# Patient Record
Sex: Male | Born: 1945 | Race: White | Hispanic: No | Marital: Married | Smoking: Former smoker
Health system: Southern US, Community
[De-identification: ages and names within clinical notes are randomized; demographics above are authoritative.]

## PROBLEM LIST (undated history)

## (undated) DIAGNOSIS — F32A Depression, unspecified: Secondary | ICD-10-CM

## (undated) DIAGNOSIS — F329 Major depressive disorder, single episode, unspecified: Secondary | ICD-10-CM

## (undated) DIAGNOSIS — I10 Essential (primary) hypertension: Secondary | ICD-10-CM

## (undated) DIAGNOSIS — I639 Cerebral infarction, unspecified: Secondary | ICD-10-CM

## (undated) DIAGNOSIS — I6200 Nontraumatic subdural hemorrhage, unspecified: Secondary | ICD-10-CM

## (undated) DIAGNOSIS — K589 Irritable bowel syndrome without diarrhea: Secondary | ICD-10-CM

## (undated) HISTORY — PX: LOOP RECORDER INSERTION: EP1214

---

## 2010-10-02 ENCOUNTER — Inpatient Hospital Stay: Payer: Self-pay | Admitting: Surgery

## 2012-09-02 DIAGNOSIS — Z9889 Other specified postprocedural states: Secondary | ICD-10-CM

## 2012-09-02 HISTORY — DX: Other specified postprocedural states: Z98.890

## 2015-07-23 ENCOUNTER — Emergency Department: Payer: Medicare PPO

## 2015-07-23 ENCOUNTER — Inpatient Hospital Stay
Admission: EM | Admit: 2015-07-23 | Discharge: 2015-07-27 | DRG: 066 | Disposition: A | Discharge status: Skilled Nursing Facility | Payer: Medicare PPO | Attending: Internal Medicine | Admitting: Internal Medicine

## 2015-07-23 ENCOUNTER — Encounter: Payer: Self-pay | Admitting: Emergency Medicine

## 2015-07-23 DIAGNOSIS — I639 Cerebral infarction, unspecified: Secondary | ICD-10-CM | POA: Diagnosis present

## 2015-07-23 DIAGNOSIS — G20 Parkinson's disease: Secondary | ICD-10-CM | POA: Diagnosis present

## 2015-07-23 DIAGNOSIS — E785 Hyperlipidemia, unspecified: Secondary | ICD-10-CM | POA: Diagnosis present

## 2015-07-23 DIAGNOSIS — I63543 Cerebral infarction due to unspecified occlusion or stenosis of bilateral cerebellar arteries: Secondary | ICD-10-CM | POA: Diagnosis not present

## 2015-07-23 DIAGNOSIS — K219 Gastro-esophageal reflux disease without esophagitis: Secondary | ICD-10-CM | POA: Diagnosis present

## 2015-07-23 DIAGNOSIS — Z6835 Body mass index (BMI) 35.0-35.9, adult: Secondary | ICD-10-CM

## 2015-07-23 DIAGNOSIS — E669 Obesity, unspecified: Secondary | ICD-10-CM | POA: Diagnosis present

## 2015-07-23 DIAGNOSIS — I10 Essential (primary) hypertension: Secondary | ICD-10-CM | POA: Diagnosis present

## 2015-07-23 DIAGNOSIS — G459 Transient cerebral ischemic attack, unspecified: Secondary | ICD-10-CM | POA: Diagnosis present

## 2015-07-23 DIAGNOSIS — Z79899 Other long term (current) drug therapy: Secondary | ICD-10-CM

## 2015-07-23 DIAGNOSIS — Z8249 Family history of ischemic heart disease and other diseases of the circulatory system: Secondary | ICD-10-CM

## 2015-07-23 DIAGNOSIS — Z87891 Personal history of nicotine dependence: Secondary | ICD-10-CM

## 2015-07-23 DIAGNOSIS — F329 Major depressive disorder, single episode, unspecified: Secondary | ICD-10-CM | POA: Diagnosis present

## 2015-07-23 DIAGNOSIS — R410 Disorientation, unspecified: Secondary | ICD-10-CM | POA: Diagnosis present

## 2015-07-23 DIAGNOSIS — G8194 Hemiplegia, unspecified affecting left nondominant side: Secondary | ICD-10-CM | POA: Diagnosis present

## 2015-07-23 HISTORY — DX: Irritable bowel syndrome, unspecified: K58.9

## 2015-07-23 HISTORY — DX: Major depressive disorder, single episode, unspecified: F32.9

## 2015-07-23 HISTORY — DX: Depression, unspecified: F32.A

## 2015-07-23 HISTORY — DX: Essential (primary) hypertension: I10

## 2015-07-23 LAB — CBC WITH DIFFERENTIAL/PLATELET
Basophils Absolute: 0.1 10*3/uL (ref 0–0.1)
Basophils Relative: 1 %
EOS ABS: 0 10*3/uL (ref 0–0.7)
Eosinophils Relative: 1 %
HEMATOCRIT: 42.8 % (ref 40.0–52.0)
HEMOGLOBIN: 14.8 g/dL (ref 13.0–18.0)
LYMPHS ABS: 1 10*3/uL (ref 1.0–3.6)
LYMPHS PCT: 10 %
MCH: 30.8 pg (ref 26.0–34.0)
MCHC: 34.5 g/dL (ref 32.0–36.0)
MCV: 89 fL (ref 80.0–100.0)
MONOS PCT: 5 %
Monocytes Absolute: 0.5 10*3/uL (ref 0.2–1.0)
NEUTROS ABS: 8.3 10*3/uL — AB (ref 1.4–6.5)
NEUTROS PCT: 83 %
Platelets: 263 10*3/uL (ref 150–440)
RBC: 4.8 MIL/uL (ref 4.40–5.90)
RDW: 12.9 % (ref 11.5–14.5)
WBC: 9.9 10*3/uL (ref 3.8–10.6)

## 2015-07-23 LAB — BASIC METABOLIC PANEL
Anion gap: 5 (ref 5–15)
BUN: 21 mg/dL — AB (ref 6–20)
CALCIUM: 9.7 mg/dL (ref 8.9–10.3)
CHLORIDE: 103 mmol/L (ref 101–111)
CO2: 30 mmol/L (ref 22–32)
Creatinine, Ser: 1.33 mg/dL — ABNORMAL HIGH (ref 0.61–1.24)
GFR calc Af Amer: >60 mL/min (ref >60)
GFR, EST NON AFRICAN AMERICAN: 52 mL/min — AB (ref >60)
Glucose, Bld: 108 mg/dL — ABNORMAL HIGH (ref 65–99)
POTASSIUM: 3.7 mmol/L (ref 3.5–5.1)
SODIUM: 138 mmol/L (ref 135–145)

## 2015-07-23 MED ORDER — ASPIRIN 81 MG PO CHEW
324.0000 mg | CHEWABLE_TABLET | Freq: Once | ORAL | Status: AC
  Administered 2015-07-23: 324 mg via ORAL
  Filled 2015-07-23 (×2): qty 4

## 2015-07-23 MED ORDER — LORAZEPAM 2 MG/ML IJ SOLN
0.5000 mg | Freq: Once | INTRAMUSCULAR | Status: AC
  Administered 2015-07-23: 0.5 mg via INTRAVENOUS
  Filled 2015-07-23: qty 1

## 2015-07-23 MED ORDER — PANTOPRAZOLE SODIUM 40 MG PO TBEC
40.0000 mg | DELAYED_RELEASE_TABLET | Freq: Every day | ORAL | Status: DC
Start: 2015-07-24 — End: 2015-07-27
  Administered 2015-07-25 – 2015-07-27 (×2): 40 mg via ORAL
  Filled 2015-07-23 (×3): qty 1

## 2015-07-23 MED ORDER — ACETAMINOPHEN 325 MG PO TABS: 650.0000 mg | ORAL_TABLET | Freq: Four times a day (QID) | ORAL | Status: DC | PRN

## 2015-07-23 MED ORDER — ACETAMINOPHEN 650 MG RE SUPP: 650.0000 mg | Freq: Four times a day (QID) | RECTAL | Status: DC | PRN

## 2015-07-23 MED ORDER — DOCUSATE SODIUM 100 MG PO CAPS
100.0000 mg | ORAL_CAPSULE | Freq: Two times a day (BID) | ORAL | Status: DC
  Administered 2015-07-23 – 2015-07-26 (×7): 100 mg via ORAL
  Filled 2015-07-23 (×7): qty 1

## 2015-07-23 MED ORDER — STROKE: EARLY STAGES OF RECOVERY BOOK
Freq: Once | Status: AC
  Administered 2015-07-24

## 2015-07-23 MED ORDER — OXYCODONE HCL 5 MG PO TABS
5.0000 mg | ORAL_TABLET | ORAL | Status: DC | PRN
  Administered 2015-07-24 – 2015-07-25 (×4): 5 mg via ORAL
  Filled 2015-07-23 (×4): qty 1

## 2015-07-23 MED ORDER — SODIUM CHLORIDE 0.9 % IJ SOLN
3.0000 mL | Freq: Two times a day (BID) | INTRAMUSCULAR | Status: DC
  Administered 2015-07-24 – 2015-07-26 (×6): 3 mL via INTRAVENOUS

## 2015-07-23 MED ORDER — ATORVASTATIN CALCIUM 20 MG PO TABS
20.0000 mg | ORAL_TABLET | Freq: Every day | ORAL | Status: DC
  Administered 2015-07-23 – 2015-07-24 (×2): 20 mg via ORAL
  Filled 2015-07-23 (×2): qty 1

## 2015-07-23 MED ORDER — SODIUM CHLORIDE 0.9 % IV SOLN
INTRAVENOUS | Status: DC
  Administered 2015-07-23 – 2015-07-24 (×3): via INTRAVENOUS

## 2015-07-23 MED ORDER — LORATADINE 10 MG PO TABS
10.0000 mg | ORAL_TABLET | Freq: Every day | ORAL | Status: DC
  Administered 2015-07-24 – 2015-07-27 (×4): 10 mg via ORAL
  Filled 2015-07-23 (×4): qty 1

## 2015-07-23 MED ORDER — HYDRALAZINE HCL 20 MG/ML IJ SOLN: 10.0000 mg | INTRAMUSCULAR | Status: DC | PRN

## 2015-07-23 MED ORDER — ONDANSETRON HCL 4 MG/2ML IJ SOLN
4.0000 mg | Freq: Four times a day (QID) | INTRAMUSCULAR | Status: DC | PRN
  Administered 2015-07-24: 4 mg via INTRAVENOUS
  Filled 2015-07-23: qty 2

## 2015-07-23 MED ORDER — DOCUSATE SODIUM 50 MG PO CAPS
50.0000 mg | ORAL_CAPSULE | Freq: Two times a day (BID) | ORAL | Status: DC
  Filled 2015-07-23: qty 1

## 2015-07-23 MED ORDER — ASPIRIN 325 MG PO TABS: 325.0000 mg | ORAL_TABLET | Freq: Every day | ORAL | Status: DC

## 2015-07-23 MED ORDER — MORPHINE SULFATE (PF) 2 MG/ML IV SOLN
2.0000 mg | INTRAVENOUS | Status: DC | PRN
  Administered 2015-07-23 – 2015-07-24 (×5): 2 mg via INTRAVENOUS
  Filled 2015-07-23 (×5): qty 1

## 2015-07-23 MED ORDER — ASPIRIN 300 MG RE SUPP: 300.0000 mg | Freq: Every day | RECTAL | Status: DC

## 2015-07-23 MED ORDER — ASPIRIN EC 81 MG PO TBEC
81.0000 mg | DELAYED_RELEASE_TABLET | Freq: Every day | ORAL | Status: DC
  Administered 2015-07-24 – 2015-07-27 (×4): 81 mg via ORAL
  Filled 2015-07-23 (×4): qty 1

## 2015-07-23 MED ORDER — ONDANSETRON HCL 4 MG PO TABS: 4.0000 mg | ORAL_TABLET | Freq: Four times a day (QID) | ORAL | Status: DC | PRN

## 2015-07-23 MED ORDER — ESCITALOPRAM OXALATE 10 MG PO TABS
20.0000 mg | ORAL_TABLET | Freq: Every day | ORAL | Status: DC
Start: 2015-07-24 — End: 2015-07-27
  Administered 2015-07-24 – 2015-07-27 (×4): 20 mg via ORAL
  Filled 2015-07-23 (×4): qty 2

## 2015-07-23 NOTE — H&P (Signed)
[facility] [place] [facility]   PATIENT NAME: Maurice Moses    MR#:  [id number]  DATE OF BIRTH:  1946-07-06   DATE OF ADMISSION:  07/23/2015  PRIMARY CARE PHYSICIAN: No primary care provider on file.   REQUESTING/REFERRING PHYSICIAN: schaevitz  CHIEF COMPLAINT:   Chief Complaint  Patient presents with  . Nausea  . Emesis    HISTORY OF PRESENT ILLNESS:  Maurice Moses  is a 69 y.o. male with a known history of essential hypertension presenting with hand clumsiness. One day duration left hand clumsiness with associated headache described as aching, 4/10, non radiating, no worsening/relieving factors. No further symptoms. Some improvement since onset but still present.  PAST MEDICAL HISTORY:   Past Medical History  Diagnosis Date  . Hypertension   . Depression   . IBS (irritable bowel syndrome)     PAST SURGICAL HISTORY:  History reviewed. No pertinent past surgical history.  SOCIAL HISTORY:   Social History  Substance Use Topics  . Smoking status: Former Games developer  . Smokeless tobacco: Not on file  . Alcohol Use: No    FAMILY HISTORY:   Family History  Problem Relation Age of Onset  . Hypertension Other     DRUG ALLERGIES:  No Known Allergies  REVIEW OF SYSTEMS:  REVIEW OF SYSTEMS:  CONSTITUTIONAL: Denies fevers, chills, fatigue, weakness.  EYES: Denies blurred vision, double vision, or eye pain.  EARS, NOSE, THROAT: Denies tinnitus, ear pain, hearing loss.  RESPIRATORY: denies cough, shortness of breath, wheezing  CARDIOVASCULAR: Denies chest pain, palpitations, edema.  GASTROINTESTINAL: Denies nausea, vomiting, diarrhea, abdominal pain.  GENITOURINARY: Denies dysuria, hematuria.  ENDOCRINE: Denies nocturia or thyroid problems. HEMATOLOGIC AND LYMPHATIC: Denies easy bruising or bleeding.  SKIN: Denies rash or lesions.  MUSCULOSKELETAL: Denies pain in neck, back, shoulder, knees, hips, or further arthritic symptoms.   NEUROLOGIC: Denies paralysis, paresthesias. Positive left hand clumsiness and headache  PSYCHIATRIC: Denies anxiety or depressive symptoms. Otherwise full review of systems performed by me is negative.   MEDICATIONS AT HOME:   Prior to Admission medications   Medication Sig Start Date End Date Taking? Authorizing Provider  cetirizine (ZYRTEC) 10 MG tablet Take 10 mg by mouth daily.   Yes Historical Provider, MD  cholecalciferol (VITAMIN D) 1000 UNITS tablet Take 2,000 Units by mouth daily.   Yes Historical Provider, MD  clorazepate (TRANXENE) 7.5 MG tablet Take 1 tablet by mouth daily.   Yes Historical Provider, MD  docusate sodium (COLACE) 50 MG capsule Take 50 mg by mouth 2 (two) times daily.   Yes Historical Provider, MD  escitalopram (LEXAPRO) 20 MG tablet Take 20 mg by mouth daily.   Yes Historical Provider, MD  esomeprazole (NEXIUM) 40 MG capsule Take 40 mg by mouth daily at 12 noon.   Yes Historical Provider, MD  hydrochlorothiazide (HYDRODIURIL) 25 MG tablet Take 25 mg by mouth daily.   Yes Historical Provider, MD  HYDROcodone-acetaminophen (NORCO/VICODIN) 5-325 MG tablet Take 1-2 tablets by mouth every 4 (four) hours as needed for moderate pain (Max 7 per day).    Yes Historical Provider, MD  mirtazapine (REMERON SOL-TAB) 30 MG disintegrating tablet Take 1 tablet by mouth daily.   Yes Historical Provider, MD  quinapril (ACCUPRIL) 40 MG tablet Take 40 mg by mouth at bedtime.   Yes Historical Provider, MD      VITAL SIGNS:  Blood pressure 181/92, pulse 97, temperature 98 F (36.7 C), temperature source Oral, resp. rate 12, height 5'  8" (1.727 m), weight 247 lb 9.2 oz (112.3 kg), SpO2 93 %.  PHYSICAL EXAMINATION:  VITAL SIGNS: Filed Vitals:   07/23/15 2034 07/23/15 2100  BP: 158/106 181/92  Pulse: 97 97  Temp:    Resp: 18 12   GENERAL:69 y.o.male currently in no acute distress.  HEAD: Normocephalic, atraumatic.  EYES: Pupils equal, round, reactive to light. Extraocular  muscles intact. No scleral icterus.  MOUTH: Moist mucosal membrane. Dentition intact. No abscess noted.  EAR, NOSE, THROAT: Clear without exudates. No external lesions.  NECK: Supple. No thyromegaly. No nodules. No JVD.  PULMONARY: Clear to ascultation, without wheeze rails or rhonci. No use of accessory muscles, Good respiratory effort. good air entry bilaterally CHEST: Nontender to palpation.  CARDIOVASCULAR: S1 and S2. Regular rate and rhythm. No murmurs, rubs, or gallops. No edema. Pedal pulses 2+ bilaterally.  GASTROINTESTINAL: Soft, nontender, nondistended. No masses. Positive bowel sounds. No hepatosplenomegaly.  MUSCULOSKELETAL: No swelling, clubbing, or edema. Range of motion full in all extremities.  NEUROLOGIC: Cranial nerves II through XII are intact. Left disdydokinesis delayed, left finger to know ataxic with past pointing.  Left hand grip 4/5 remainder of strength 5/5 Sensation intact. Reflexes intact.  SKIN: No ulceration, lesions, rashes, or cyanosis. Skin warm and dry. Turgor intact.  PSYCHIATRIC: Mood, affect within normal limits. The patient is awake, alert and oriented x 3. Insight, judgment intact.    LABORATORY PANEL:   CBC  Recent Labs Lab 07/23/15 1800  WBC 9.9  HGB 14.8  HCT 42.8  PLT 263   ------------------------------------------------------------------------------------------------------------------  Chemistries   Recent Labs Lab 07/23/15 1800  NA 138  K 3.7  CL 103  CO2 30  GLUCOSE 108*  BUN 21*  CREATININE 1.33*  CALCIUM 9.7   ------------------------------------------------------------------------------------------------------------------  Cardiac Enzymes No results for input(s): TROPONINI in the last 168 hours. ------------------------------------------------------------------------------------------------------------------  RADIOLOGY:  Ct Head Wo Contrast  07/23/2015  CLINICAL DATA:  Nausea and vomiting today. Episode of left arm  weakness last night. Altered mental status last night. EXAM: CT HEAD WITHOUT CONTRAST TECHNIQUE: Contiguous axial images were obtained from the base of the skull through the vertex without intravenous contrast. COMPARISON:  None. FINDINGS: Diffusely enlarged ventricles and subarachnoid spaces. No intracranial hemorrhage, mass lesion or CT evidence of acute infarction. Bilateral ethmoid sinus mucosal thickening. Unremarkable bones. IMPRESSION: 1. No acute abnormality. 2. Moderate diffuse cerebral atrophy and mild diffuse cerebellar atrophy. 3. Mild chronic bilateral ethmoid sinusitis. Electronically Signed   By: Beckie SaltsSteven  Reid M.D.   On: 07/23/2015 19:36    EKG:   Orders placed or performed during the hospital encounter of 07/23/15  . EKG 12-Lead  . EKG 12-Lead    IMPRESSION AND PLAN:   69 year old gentleman with history of essential hypertension presenting with left hand clumsiness  1. TIA: ct negative, aspirin, statin therapy, telemetry, neuro checks. Check mri brain, lipids, carotid doppler, echo searching for risk factor modification 2. Essential hypertension: permissive hypertension treating only greater than 220/120 3. gerd without esophagitis: ppi therapy 4. Venous thromboembolism prophylactic: scd    All the records are reviewed and case discussed with ED provider. Management plans discussed with the patient, family and they are in agreement.  CODE STATUS: full  TOTAL TIME TAKING CARE OF THIS PATIENT: 35 minutes.    Hower,  Mardi MainlandDavid K M.D on 07/23/2015 at 9:48 PM  Between 7am to 6pm - Pager - [phone]  After 6pm: House Pager: - [phone]  [facility]  Office  [phone]  CC: Primary care physician; No primary care provider on file.

## 2015-07-23 NOTE — ED Notes (Signed)
Patient brought in by [facility] c/o Nausea and vomiting that started today. Patient also reports that he had an episode of left arm weakness last night, weakness has since resolved, patient also reports feeling disoriented last PM.  Patient was given Zofran 4mg  and 250 mL Normal saline by EMS. At home patient took phenergan 12.5 mg at 9:30 and 12:30 and also took hydrocodone 5/325mg 

## 2015-07-23 NOTE — ED Provider Notes (Signed)
[facility] Emergency Department Provider Note  ____________________________________________  Time seen: Approximately 630 PM  I have reviewed the triage vital signs and the nursing notes.   HISTORY  Chief Complaint Nausea and Emesis    HPI Maurice Moses is a 69 y.o. male with a history of hypertension and depression who is presenting today with left-sided arm ataxia and headache. He said that he woke up at about 3 AM in the middle the night last night and felt clumsy on his feet. He said that he also felt that he could not direct his left arm where he wanted to. He then went back to sleep. When he woke up he started having dizziness episodes. He said the first was at 9 AM and then had another one about 12 or 1 PM. He says he became so dizzy that he vomited twice. His that he is vomiting secondary to dizziness and has no nausea at this time. He also reports a mild to moderate throbbing headache to the back and the front of his head. That it was not sudden onset and was not thunderclap in quality.   Past Medical History  Diagnosis Date  . Hypertension   . Depression   . IBS (irritable bowel syndrome)     There are no active problems to display for this patient.   History reviewed. No pertinent past surgical history.  No current outpatient prescriptions on file.  Allergies Review of patient's allergies indicates no known allergies.  No family history on file.  Social History Social History  Substance Use Topics  . Smoking status: Former Games developer  . Smokeless tobacco: None  . Alcohol Use: No    Review of Systems Constitutional: No fever/chills Eyes: No visual changes. ENT: No sore throat. Cardiovascular: Denies chest pain. Respiratory: Denies shortness of breath. Gastrointestinal: No abdominal pain.  No nausea, no vomiting.  No diarrhea.  No constipation. Genitourinary: Negative for dysuria. Musculoskeletal: Negative for back pain. Skin:  Negative for rash. Neurological: Negative for  focal weakness or numbness.  10-point ROS otherwise negative.  ____________________________________________   PHYSICAL EXAM:  VITAL SIGNS: ED Triage Vitals  Enc Vitals Group     BP 07/23/15 1740 159/87 mmHg     Pulse Rate 07/23/15 1740 88     Resp 07/23/15 1740 16     Temp 07/23/15 1740 98 F (36.7 C)     Temp Source 07/23/15 1740 Oral     SpO2 07/23/15 1740 96 %     Weight 07/23/15 1740 247 lb 9.2 oz (112.3 kg)     Height 07/23/15 1740  (1.727 m)     Head Cir --      Peak Flow --      Pain Score 07/23/15 1741 6     Pain Loc --      Pain Edu? --      Excl. in GC? --     Constitutional: Alert and oriented. Well appearing and in no acute distress. Eyes: Conjunctivae are normal. PERRL. EOMI. Head: Atraumatic. TMs and canals are normal bilaterally.  Nose: No congestion/rhinnorhea. Mouth/Throat: Mucous membranes are moist.  Oropharynx non-erythematous. Neck: No stridor.   Cardiovascular: Normal rate, regular rhythm. Grossly normal heart sounds.  Good peripheral circulation. Respiratory: Normal respiratory effort.  No retractions. Lungs CTAB. Gastrointestinal: Soft and nontender. No distention. No abdominal bruits. No CVA tenderness. Musculoskeletal: No lower extremity tenderness nor edema.  No joint effusions. Neurologic:  Normal speech and language. No ataxia on heel-to-shin  testing but is ataxic on finger to nose testing on the left side. There is no nystagmus. Skin:  Skin is warm, dry and intact. No rash noted. Psychiatric: Mood and affect are normal. Speech and behavior are normal.  NIH Stroke Scale   Person Administering Scale: Arelia LongestSchaevitz,  Andros Channing M  Administer stroke scale items in the order listed. Record performance in each category after each subscale exam. Do not go back and change scores. Follow directions provided for each exam technique. Scores should reflect what the patient does, not what the clinician thinks  the patient can do. The clinician should record answers while administering the exam and work quickly. Except where indicated, the patient should not be coached (i.e., repeated requests to patient to make a special effort).   1a  Level of consciousness: 0=alert; keenly responsive  1b. LOC questions:  0=Performs both tasks correctly  1c. LOC commands: 0=Performs both tasks correctly  2.  Best Gaze: 0=normal  3.  Visual: 0=No visual loss  4. Facial Palsy: 0=Normal symmetric movement  5a.  Motor left arm: 0=No drift, limb holds 90 (or 45) degrees for full 10 seconds  5b.  Motor right arm: 0=No drift, limb holds 90 (or 45) degrees for full 10 seconds  6a. motor left leg: 0=No drift, limb holds 90 (or 45) degrees for full 10 seconds  6b  Motor right leg:  0=No drift, limb holds 90 (or 45) degrees for full 10 seconds  7. Limb Ataxia: 1=Present in one limb  8.  Sensory: 0=Normal; no sensory loss  9. Best Language:  0=No aphasia, normal  10. Dysarthria: 0=Normal  11. Extinction and Inattention: 0=No abnormality  12. Distal motor function: 0=Normal   Total:   1   ____________________________________________   LABS (all labs ordered are listed, but only abnormal results are displayed)  Labs Reviewed  CBC WITH DIFFERENTIAL/PLATELET - Abnormal; Notable for the following:    Neutro Abs 8.3 (*)    All other components within normal limits  BASIC METABOLIC PANEL - Abnormal; Notable for the following:    Glucose, Bld 108 (*)    BUN 21 (*)    Creatinine, Ser 1.33 (*)    GFR calc non Af Amer 52 (*)    All other components within normal limits   ____________________________________________  EKG  ED ECG REPORT I, Arelia LongestSchaevitz,  Azaliyah Kennard M, the attending physician, personally viewed and interpreted this ECG.   Date: 07/23/2015  EKG Time: 1740  Rate: 87  Rhythm: normal sinus rhythm  Axis: Normal axis  Intervals:none  ST&T Change: No ST segment elevation or depression. No abnormal T-wave  inversion.  ____________________________________________  RADIOLOGY  No acute abnormality on the CAT scan of the brain. ____________________________________________   PROCEDURES    ____________________________________________   INITIAL IMPRESSION / ASSESSMENT AND PLAN / ED COURSE  Pertinent labs & imaging results that were available during my care of the patient were reviewed by me and considered in my medical decision making (see chart for details).  ----------------------------------------- 8:44 PM on 07/23/2015 -----------------------------------------  Patient is resting comfortably at this time. Remains ataxic when walking. Also still with clumsy feeling to left arm. We'll admit to the hospital. Signed out to Dr. Clint GuyHower. We'll give aspirin. Patient says headache is mild to moderate and unchanged from previous headaches that he has had. Do not suspect subarachnoid hemorrhage. ____________________________________________   FINAL CLINICAL IMPRESSION(S) / ED DIAGNOSES  CVA.    Myrna Blazeravid Matthew Mannix Kroeker, MD 07/23/15 2045

## 2015-07-24 ENCOUNTER — Observation Stay (HOSPITAL_BASED_OUTPATIENT_CLINIC_OR_DEPARTMENT_OTHER)
Admit: 2015-07-24 | Discharge: 2015-07-24 | Disposition: A | Discharge status: Home / Self Care | Payer: Medicare PPO | Attending: Internal Medicine | Admitting: Internal Medicine

## 2015-07-24 ENCOUNTER — Observation Stay: Payer: Medicare PPO

## 2015-07-24 ENCOUNTER — Inpatient Hospital Stay: Payer: Medicare PPO

## 2015-07-24 DIAGNOSIS — Z79899 Other long term (current) drug therapy: Secondary | ICD-10-CM | POA: Diagnosis not present

## 2015-07-24 DIAGNOSIS — Z8249 Family history of ischemic heart disease and other diseases of the circulatory system: Secondary | ICD-10-CM | POA: Diagnosis not present

## 2015-07-24 DIAGNOSIS — I639 Cerebral infarction, unspecified: Secondary | ICD-10-CM | POA: Diagnosis present

## 2015-07-24 DIAGNOSIS — R410 Disorientation, unspecified: Secondary | ICD-10-CM | POA: Diagnosis present

## 2015-07-24 DIAGNOSIS — Z87891 Personal history of nicotine dependence: Secondary | ICD-10-CM | POA: Diagnosis not present

## 2015-07-24 DIAGNOSIS — G20 Parkinson's disease: Secondary | ICD-10-CM | POA: Diagnosis present

## 2015-07-24 DIAGNOSIS — I6789 Other cerebrovascular disease: Secondary | ICD-10-CM | POA: Diagnosis not present

## 2015-07-24 DIAGNOSIS — K219 Gastro-esophageal reflux disease without esophagitis: Secondary | ICD-10-CM | POA: Diagnosis present

## 2015-07-24 DIAGNOSIS — I10 Essential (primary) hypertension: Secondary | ICD-10-CM | POA: Diagnosis present

## 2015-07-24 DIAGNOSIS — I63543 Cerebral infarction due to unspecified occlusion or stenosis of bilateral cerebellar arteries: Secondary | ICD-10-CM | POA: Diagnosis present

## 2015-07-24 DIAGNOSIS — Z6835 Body mass index (BMI) 35.0-35.9, adult: Secondary | ICD-10-CM | POA: Diagnosis not present

## 2015-07-24 DIAGNOSIS — E669 Obesity, unspecified: Secondary | ICD-10-CM | POA: Diagnosis present

## 2015-07-24 DIAGNOSIS — G8194 Hemiplegia, unspecified affecting left nondominant side: Secondary | ICD-10-CM | POA: Diagnosis present

## 2015-07-24 DIAGNOSIS — F329 Major depressive disorder, single episode, unspecified: Secondary | ICD-10-CM | POA: Diagnosis present

## 2015-07-24 DIAGNOSIS — E785 Hyperlipidemia, unspecified: Secondary | ICD-10-CM | POA: Diagnosis present

## 2015-07-24 LAB — HEMOGLOBIN A1C: HEMOGLOBIN A1C: 5.3 % (ref 4.0–6.0)

## 2015-07-24 LAB — LIPID PANEL
Cholesterol: 217 mg/dL — ABNORMAL HIGH (ref 0–200)
HDL: 40 mg/dL — AB (ref >40)
LDL CALC: 138 mg/dL — AB (ref 0–99)
TRIGLYCERIDES: 196 mg/dL — AB (ref <150)
Total CHOL/HDL Ratio: 5.4 RATIO
VLDL: 39 mg/dL (ref 0–40)

## 2015-07-24 MED ORDER — LORAZEPAM 1 MG PO TABS
2.0000 mg | ORAL_TABLET | ORAL | Status: DC | PRN
  Administered 2015-07-24 – 2015-07-27 (×7): 2 mg via ORAL
  Filled 2015-07-24 (×7): qty 2

## 2015-07-24 MED ORDER — MIRTAZAPINE 15 MG PO TBDP
30.0000 mg | ORAL_TABLET | Freq: Every day | ORAL | Status: DC
  Administered 2015-07-24 – 2015-07-26 (×3): 30 mg via ORAL
  Filled 2015-07-24 (×5): qty 2

## 2015-07-24 MED ORDER — ENOXAPARIN SODIUM 40 MG/0.4ML ~~LOC~~ SOLN
40.0000 mg | SUBCUTANEOUS | Status: DC
  Administered 2015-07-24 – 2015-07-26 (×3): 40 mg via SUBCUTANEOUS
  Filled 2015-07-24 (×4): qty 0.4

## 2015-07-24 NOTE — Progress Notes (Signed)
*  PRELIMINARY RESULTS* Echocardiogram 2D Echocardiogram has been performed.  Maurice Moses 07/24/2015, 9:49 AM

## 2015-07-24 NOTE — Plan of Care (Signed)
Problem: Self-Care: Goal: Ability to participate in self-care as condition permits will improve Outcome: Progressing Pain medications given with noted relief. VSS. Nausea  edication given were effective. Anxiety medication given,patient remains anxious. NIH scale in morning was 2, reassessed this afternoon after noticing patient speech had changed. Patient worked with OT and PT.

## 2015-07-24 NOTE — Evaluation (Signed)
Occupational Therapy Evaluation Patient Details Name: Royanne FootsClaude Lovering MRN: [id number] DOB: 01/27/1946 Today's Date: 07/24/2015    History of Present Illness Pt is 69 year old male with a known history of essential hypertension presenting with L hand clumsiness. One day duration left hand clumsiness with associated headache described as aching, 4/10, non radiating, no worsening/relieving factors. No further symptoms. Some improvement since onset but still present..  He has a hx of depression and IBS.  CT negative and having MRI today.   Clinical Impression   Pt presents with decreased awareness of deficits and has decreased center of balance and leans to left and back with pushing.  He has difficulty sitting safely EOB without use of mirror for visual feedback and mod to max cues and mod assist.  Unable to complete upper body assessment sitting EOB due to this.  He is not able to safely reach his feet due to decreased balance, impulsivity and decreased awareness of deficits.  CT was negative and MRI to be completed today.  He requires max assist for LB dressing skills and min to mod for grooming and UB dressing due to impaired fine motor, drift, and overshooting with L hand with decreased proprioception but intact sensation and strength 4/5.  He would benefit from continued OT services as frequently as possible in hospital to address use and function of LUE and hand, ADL retraining and family ed and training.  Strongly rec CIR for continued rehab to return to PLOF and be safe at home.      Follow Up Recommendations  CIR    Equipment Recommendations  Tub/shower seat    Recommendations for Other Services       Precautions / Restrictions Precautions Precautions: Fall Restrictions Weight Bearing Restrictions: No      Mobility Bed Mobility                  Transfers                      Balance                                            ADL Overall  ADL's : Needs assistance/impaired                                     Functional mobility during ADLs:  (unable to safely sit EOB for evaluation) General ADL Comments: Pt currently requires min assist for grooming skills when using L hand with R due to decreased coordination and proprioception, and max assist for LB dressing due to decreased awareness of center of balance, leans back and to left and  has difficulty sitting EOB unsupported and verbally tries to compensate stating this is normal and been there a while.  He has a shower stall with built in seat with hand held shower but rec a shower chair with back for safety.  Grab bar in shower stall as well. Has several throw rugs that need to be removed in house.     Vision     Perception     Praxis      Pertinent Vitals/Pain Pain Assessment: No/denies pain     Hand Dominance Right   Extremity/Trunk Assessment Upper Extremity Assessment Upper Extremity Assessment: LUE deficits/detail LUE Deficits /  Details: impaired proprioception, fine motor and motor planning, difficulty completing 9 hole peg test with L hand LUE Sensation:  (intact sensation but overshoots ) LUE Coordination: decreased fine motor;decreased gross motor   Lower Extremity Assessment Lower Extremity Assessment: Defer to PT evaluation       Communication Communication Communication: No difficulties   Cognition Arousal/Alertness: Awake/alert Behavior During Therapy: WFL for tasks assessed/performed Overall Cognitive Status: Within Functional Limits for tasks assessed (impulsive and minimizes balance and deficits)                     General Comments       Exercises       Shoulder Instructions      Home Living Family/patient expects to be discharged to:: Private residence Living Arrangements: Spouse/significant other Available Help at Discharge: Family (Daughter lives in [place] and works full time) Type of Home: House        Home Layout: One level     Bathroom Shower/Tub: Walk-in shower Shower/tub characteristics: Door Firefighter: Standard Bathroom Accessibility: Yes How Accessible: Accessible via walker Home Equipment: Hand held shower head;Shower seat - built in          Prior Functioning/Environment Level of Independence: Independent        Comments: wife was helping him with sock and shoes; 2 falls reported in last 6 months    OT Diagnosis: Generalized weakness;Hemiplegia non-dominant side;Cognitive deficits (OX3 but impulsive with decreased awareness of deficits)   OT Problem List: Decreased strength;Decreased activity tolerance;Decreased safety awareness;Decreased coordination   OT Treatment/Interventions: Self-care/ADL training;Therapeutic exercise;Neuromuscular education;Therapeutic activities;Balance training    OT Goals(Current goals can be found in the care plan section) Acute Rehab OT Goals Patient Stated Goal: "to get better and hopefully go home" OT Goal Formulation: With patient/family Time For Goal Achievement: 08/07/15 Potential to Achieve Goals: Good ADL Goals Pt Will Perform Grooming: with min assist;sitting Pt Will Perform Lower Body Dressing: with mod assist;sit to/from stand;with set-up (no LOB when standing or sitting) Pt/caregiver will Perform Home Exercise Program: Left upper extremity;With theraputty;With written HEP provided  OT Frequency:  (pt would benefit from 5 times a week but will be seen minimum of 1-2 times a week in hospital)   Barriers to D/C:            Co-evaluation              End of Session Nurse Communication:  (rec CIR )  Activity Tolerance: Patient limited by fatigue Patient left: in bed;with bed alarm set;with family/visitor present (wife and daughter present)   Time: 1020-1100 OT Time Calculation (min): 40 min Charges:  OT General Charges $OT Visit: 1 Procedure OT Evaluation $Initial OT Evaluation Tier I: 1 Procedure OT  Treatments $Self Care/Home Management : 8-22 mins $Therapeutic Activity: 8-22 mins G-Codes:    Kierra Jezewski 08-11-15, 11:36 AM    Susanne Borders, OTR/L ascom [phone]

## 2015-07-24 NOTE — Plan of Care (Signed)
Problem: Education: Goal: Knowledge of disease or condition will improve Outcome: Progressing Pt given stroke education material. Explained to pt and wife why blood pressure medication pt takes a t home was not ordered inpatient. Also explained to pt the reason for lipitor being added.  Goal: Knowledge of secondary prevention will improve Outcome: Progressing Stroke education material given to pt.

## 2015-07-24 NOTE — Evaluation (Signed)
Physical Therapy Evaluation Patient Details Name: Maurice Moses MRN: [id number] DOB: 12/13/1945 Today's Date: 07/24/2015   History of Present Illness  Pt is 69 year old male with a known history of essential hypertension presenting with L hand clumsiness. One day duration left hand clumsiness with associated headache described as aching, 4/10, non radiating, no worsening/relieving factors. No further symptoms. Some improvement since onset but still present..  He has a hx of depression and IBS.  CT negative and having MRI today.  Clinical Impression  Pt was seen for assessment of balance and control of gait and transfers, with significant fall risk discovered.  Pt is asking to go directly home and have HHPT follow instead.  Very limited willingness to discuss inpt therapy but wife is in agreement.  Will allow them to discuss with case manager, who is aware.    Follow Up Recommendations CIR    Equipment Recommendations  Rolling walker with 5" wheels    Recommendations for Other Services Rehab consult     Precautions / Restrictions Precautions Precautions: Fall Precaution Comments: telemetry Restrictions Weight Bearing Restrictions: No      Mobility  Bed Mobility Overal bed mobility: Needs Assistance Bed Mobility: Supine to Sit;Sit to Supine     Supine to sit: Mod assist;Max assist Sit to supine: Mod assist;Max assist   General bed mobility comments: pt cannot coordinate his LE and UE assist on Lside  Transfers Overall transfer level: Needs assistance Equipment used: Rolling walker (2 wheeled);2 person hand held assist Transfers: Sit to/from BJ'sStand;Stand Pivot Transfers Sit to Stand: Mod assist;+2 physical assistance;+2 safety/equipment Stand pivot transfers: Mod assist;+2 physical assistance;+2 safety/equipment       General transfer comment: remidners for hand placement and sequence  Ambulation/Gait Ambulation/Gait assistance: Mod assist;+2 physical assistance;+2  safety/equipment Ambulation Distance (Feet): 14 Feet Assistive device: Rolling walker (2 wheeled);2 person hand held assist Gait Pattern/deviations: Step-through pattern;Step-to pattern;Wide base of support;Trunk flexed;Staggering left;Shuffle Gait velocity: reduced, halting Gait velocity interpretation: Below normal speed for age/gender    Stairs            Wheelchair Mobility    Modified Rankin (Stroke Patients Only) Modified Rankin (Stroke Patients Only) Pre-Morbid Rankin Score: Slight disability Modified Rankin: Severe disability     Balance Overall balance assessment: Needs assistance;History of Falls Sitting-balance support: Feet supported Sitting balance-Leahy Scale: Poor   Postural control: Posterior lean Standing balance support: Bilateral upper extremity supported Standing balance-Leahy Scale: Poor                               Pertinent Vitals/Pain Pain Assessment: Faces Faces Pain Scale: Hurts little more Pain Location: neck Pain Intervention(s): Repositioned    Home Living Family/patient expects to be discharged to:: Private residence Living Arrangements: Spouse/significant other Available Help at Discharge: Family;Available PRN/intermittently Type of Home: House       Home Layout: One level Home Equipment: Hand held shower head;Shower seat - built in      Prior Function Level of Independence: Independent (2 falls in last 6 months)         Comments: wife was helping him with sock and shoes; 2 falls reported in last 6 months     Hand Dominance   Dominant Hand: Right    Extremity/Trunk Assessment   Upper Extremity Assessment: Defer to OT evaluation       LUE Deficits / Details: impaired proprioception, fine motor and motor planning, difficulty completing 9 hole  peg test with L hand   Lower Extremity Assessment:  (Pt is strong but not coordinated)      Cervical / Trunk Assessment: Normal  Communication    Communication: No difficulties  Cognition Arousal/Alertness: Awake/alert Behavior During Therapy: WFL for tasks assessed/performed Overall Cognitive Status: Difficult to assess       Memory: Decreased recall of precautions;Decreased short-term memory (Possibly ignoring instructions for his denial of the issue)              General Comments General comments (skin integrity, edema, etc.): cannot control his sitting balance or standing with mod assist to maintain sitting balaance    Exercises        Assessment/Plan    PT Assessment Patient needs continued PT services  PT Diagnosis Altered mental status;Difficulty walking   PT Problem List Decreased range of motion;Decreased activity tolerance;Decreased balance;Decreased mobility;Decreased coordination;Decreased cognition;Decreased knowledge of use of DME;Decreased safety awareness;Decreased knowledge of precautions;Cardiopulmonary status limiting activity;Obesity;Pain  PT Treatment Interventions DME instruction;Gait training;Functional mobility training;Therapeutic activities;Therapeutic exercise;Balance training;Neuromuscular re-education;Patient/family education;Cognitive remediation   PT Goals (Current goals can be found in the Care Plan section) Acute Rehab PT Goals Patient Stated Goal: "to get better and hopefully go home" PT Goal Formulation: With patient/family Time For Goal Achievement: 08/07/15 Potential to Achieve Goals: Good    Frequency Min 2X/week   Barriers to discharge Decreased caregiver support (Needs 2 person assist at all times for mobiltiy)      Co-evaluation               End of Session Equipment Utilized During Treatment: Gait belt Activity Tolerance: Other (comment);Patient tolerated treatment well (impulsive and unsafe, unaware of this) Patient left: in bed;with call bell/phone within reach;with family/visitor present;with bed alarm set Nurse Communication: Mobility status         Time:  8657-8469 PT Time Calculation (min) (ACUTE ONLY): 39 min   Charges:   PT Evaluation $Initial PT Evaluation Tier I: 1 Procedure PT Treatments $Gait Training: 8-22 mins $Therapeutic Activity: 8-22 mins   PT G Codes:        Ivar Drape 08-12-2015, 12:53 PM   Samul Dada, PT MS Acute Rehab Dept. Number: [facility] [id number] and [facility] [phone]

## 2015-07-24 NOTE — Progress Notes (Signed)
Rehab Admissions Coordinator Note:  Patient was screened by Clois DupesBoyette, Herman Fiero Godwin for appropriateness for an Inpatient Acute Rehab Consult per PT and OT recommendations.  At this time, we are recommending an inpt rehab consult so that I can complete an onsite assessment with pt and family to discuss a possible [facility]. Please place an order and I will follow up tomorrow.  Clois DupesBoyette, Mayra Jolliffe Godwin 07/24/2015, 9:59 PM  I can be reached at [phone].

## 2015-07-24 NOTE — Progress Notes (Signed)
[facility] [place] at [facility]                                                                                                                                                                                            Patient Demographics   Maurice Moses, is a 69 y.o. male, DOB - 11/12/1945, ZOX:[id number]  Admit date - 07/23/2015   Admitting Physician Wyatt Haste, MD  Outpatient Primary MD for the patient is No primary care provider on file.   LOS -   Subjective: Patient having trouble with left hand poor control and with holding things.   Review of Systems:   CONSTITUTIONAL: No documented fever. No fatigue, weakness. No weight gain, no weight loss.  EYES: No blurry or double vision.  ENT: No tinnitus. No postnasal drip. No redness of the oropharynx.  RESPIRATORY: No cough, no wheeze, no hemoptysis. No dyspnea.  CARDIOVASCULAR: No chest pain. No orthopnea. No palpitations. No syncope.  GASTROINTESTINAL: No nausea, no vomiting or diarrhea. No abdominal pain. No melena or hematochezia.  GENITOURINARY: No dysuria or hematuria.  ENDOCRINE: No polyuria or nocturia. No heat or cold intolerance.  HEMATOLOGY: No anemia. No bruising. No bleeding.  INTEGUMENTARY: No rashes. No lesions.  MUSCULOSKELETAL: No arthritis. No swelling. No gout.  NEUROLOGIC: No numbness, tingling, or ataxia. No seizure-type activity. Left hand with poor control PSYCHIATRIC: No anxiety. No insomnia. No ADD.    Vitals:   Filed Vitals:   07/23/15 2214 07/23/15 2300 07/24/15 0041 07/24/15 0218  BP: 154/86   140/78  Pulse: 99   85  Temp: 99.2 F (37.3 C) 99.2 F (37.3 C) 99.5 F (37.5 C) 98.4 F (36.9 C)  TempSrc: Oral  Oral Oral  Resp: 17   19  Height:  (1.727 m)     Weight: 104.826 kg (231 lb 1.6 oz)     SpO2: 95%   96%    Wt Readings from Last 3 Encounters:  07/23/15 104.826 kg (231 lb 1.6 oz)     Intake/Output Summary (Last 24 hours) at 07/24/15 1445 Last data  filed at 07/24/15 1300  Gross per 24 hour  Intake    480 ml  Output      0 ml  Net    480 ml    Physical Exam:   GENERAL: Pleasant-appearing in no apparent distress.  HEAD, EYES, EARS, NOSE AND THROAT: Atraumatic, normocephalic. Extraocular muscles are intact. Pupils equal and reactive to light. Sclerae anicteric. No conjunctival injection. No oro-pharyngeal erythema.  NECK: Supple. There is no jugular venous distention. No bruits, no lymphadenopathy, no thyromegaly.  HEART:  Regular rate and rhythm,. No murmurs, no rubs, no clicks.  LUNGS: Clear to auscultation bilaterally. No rales or rhonchi. No wheezes.  ABDOMEN: Soft, flat, nontender, nondistended. Has good bowel sounds. No hepatosplenomegaly appreciated.  EXTREMITIES: No evidence of any cyanosis, clubbing, or peripheral edema.  +2 pedal and radial pulses bilaterally.  NEUROLOGIC: The patient is alert, awake, and oriented x3 with no focal motor or sensory deficits appreciated bilaterally.  SKIN: Moist and warm with no rashes appreciated.  Psych: Not anxious, depressed LN: No inguinal LN enlargement    Antibiotics   Anti-infectives    None      Medications   Scheduled Meds: . aspirin EC  81 mg Oral Daily  . atorvastatin  20 mg Oral q1800  . docusate sodium  100 mg Oral BID  . escitalopram  20 mg Oral Daily  . loratadine  10 mg Oral Daily  . mirtazapine  30 mg Oral QHS  . pantoprazole  40 mg Oral Daily  . sodium chloride  3 mL Intravenous Q12H   Continuous Infusions:  PRN Meds:.acetaminophen **OR** acetaminophen, hydrALAZINE, LORazepam, morphine injection, ondansetron **OR** ondansetron (ZOFRAN) IV, oxyCODONE   Data Review:   Micro Results No results found for this or any previous visit (from the past 240 hour(s)).  Radiology Reports Ct Head Wo Contrast  07/23/2015  CLINICAL DATA:  Nausea and vomiting today. Episode of left arm weakness last night. Altered mental status last night. EXAM: CT HEAD WITHOUT CONTRAST  TECHNIQUE: Contiguous axial images were obtained from the base of the skull through the vertex without intravenous contrast. COMPARISON:  None. FINDINGS: Diffusely enlarged ventricles and subarachnoid spaces. No intracranial hemorrhage, mass lesion or CT evidence of acute infarction. Bilateral ethmoid sinus mucosal thickening. Unremarkable bones. IMPRESSION: 1. No acute abnormality. 2. Moderate diffuse cerebral atrophy and mild diffuse cerebellar atrophy. 3. Mild chronic bilateral ethmoid sinusitis. Electronically Signed   By: Beckie Salts M.D.   On: 07/23/2015 19:36   Mr Brain Wo Contrast  07/24/2015  CLINICAL DATA:  Possible head injury 5 days ago. History of hypertension. Hand clumsiness for 2 days. Headache. EXAM: MRI HEAD WITHOUT CONTRAST TECHNIQUE: Multiplanar, multiecho pulse sequences of the brain and surrounding structures were obtained without intravenous contrast. COMPARISON:  CT head 07/23/2015. FINDINGS: Focal and confluent areas of restricted diffusion affect the LEFT greater than RIGHT cerebellum. The largest area of confluent abnormal signal involves the LEFT superior cerebellar hemisphere, LEFT SCA distribution. Similar lesser involvement RIGHT SCA territory, with a wedge-shaped cortical infarct, more laterally. Cerebellar deep white matter involvement on the LEFT involves a portion of the dentate nucleus. No acute infarction of the vermis, or inferior cerebellum. No brainstem or supratentorial involvement. No hemorrhagic transformation. Generalized atrophy with hydrocephalus ex vacuo. Minor white matter disease, likely chronic microvascular ischemic change. No chronic hemorrhage. Flow voids are maintained in the carotid, basilar, and both vertebral arteries. No midline abnormality. Minor chronic sinus disease. Minor RIGHT mastoid effusion. Negative orbits. Extracranial soft tissues unremarkable. Compared with yesterday's CT, the areas of infarction are not clearly visible. IMPRESSION: Focal and  confluent areas of acute nonhemorrhagic infarction affecting the BILATERAL cerebellar hemispheres, with greatest involvement in the LEFT superior cerebellar artery territory. No brainstem involvement. Given the different vascular territories involved, shower of emboli is suspected. No posttraumatic sequelae are evident. No appreciable stenosis or occlusion of the BILATERAL distal vertebral arteries and basilar artery. If further investigation desired, consider extracranial and intracranial MRA for further evaluation. Electronically Signed   By: Jackquline Denmark  Curnes M.D.   On: 07/24/2015 13:06     CBC  Recent Labs Lab 07/23/15 1800  WBC 9.9  HGB 14.8  HCT 42.8  PLT 263  MCV 89.0  MCH 30.8  MCHC 34.5  RDW 12.9  LYMPHSABS 1.0  MONOABS 0.5  EOSABS 0.0  BASOSABS 0.1    Chemistries   Recent Labs Lab 07/23/15 1800  NA 138  K 3.7  CL 103  CO2 30  GLUCOSE 108*  BUN 21*  CREATININE 1.33*  CALCIUM 9.7   ------------------------------------------------------------------------------------------------------------------ estimated creatinine clearance is 59.8 mL/min (by C-G formula based on Cr of 1.33). ------------------------------------------------------------------------------------------------------------------ No results for input(s): HGBA1C in the last 72 hours. ------------------------------------------------------------------------------------------------------------------  Recent Labs  07/24/15 0454  CHOL 217*  HDL 40*  LDLCALC 138*  TRIG 196*  CHOLHDL 5.4   ------------------------------------------------------------------------------------------------------------------ No results for input(s): TSH, T4TOTAL, T3FREE, THYROIDAB in the last 72 hours.  Invalid input(s): FREET3 ------------------------------------------------------------------------------------------------------------------ No results for input(s): VITAMINB12, FOLATE, FERRITIN, TIBC, IRON, RETICCTPCT in the  last 72 hours.  Coagulation profile No results for input(s): INR, PROTIME in the last 168 hours.  No results for input(s): DDIMER in the last 72 hours.  Cardiac Enzymes No results for input(s): CKMB, TROPONINI, MYOGLOBIN in the last 168 hours.  Invalid input(s): CK ------------------------------------------------------------------------------------------------------------------ Invalid input(s): POCBNP    Assessment & Plan   69 year old gentleman with history of essential hypertension presenting with left hand clumsiness  1. 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 Essential hypertension: permissive hypertension treating only greater than 220/120 3. gerd without esophagitis: ppi therapy 4. Venous thromboembolism prophylactic: scd     Code Status Orders        Start     Ordered   07/23/15 2046  Full code   Continuous     07/23/15 2045    Advance Directive Documentation        Most Recent Value   Type of Advance Directive  Healthcare Power of Attorney   Pre-existing out of facility DNR order (yellow form or pink MOST form)     "MOST" Form in Place?             Consults neurology  DVT Prophylaxis start Lovenox  Lab Results  Component Value Date   PLT 263 07/23/2015     Time Spent in minutes  45 minutes  Auburn BilberryPATEL, Jacques Willingham M.D on 07/24/2015 at 2:45 PM  Between 7am to 6pm - Pager - [phone]  After 6pm go to [email or web address] - password [email or web address] [facility]  [facility]   Office  [phone]

## 2015-07-24 NOTE — Care Management Note (Signed)
Case Management Note  Patient Details  Name: Maurice Moses MRN: [id number] Date of Birth: 03/30/1946  Subjective/Objective:     PT and OT are both recommending CIR and requested that this writer speak to [name] and his wife about CIR. This Clinical research associatewriter presented Mr and [name] with information about acute inpatient re[facility] and [facility]. Explained to [name] that almost every major hospital has an acute inpatient rehab and that if she wanted information about a CIR in another location that case management will be glad to provide her with information for that facility. Explained to both Mr and [name] that the choice of where to go after this hospital discharge was their decision. This Clinical research associatewriter explained that we were just beginning a conversation about discharge possibilities for the family to consider. [name] stated to this writer that at this time she does not feel like she can manage [name] at home. [name] is adamant that he is going home and use his home gym. This writer gently requested to both Mr and [name] that they continue to discuss discharge planning as a family.               Action/Plan:   Expected Discharge Date:                  Expected Discharge Plan:     In-House Referral:     Discharge planning Services     Post Acute Care Choice:    Choice offered to:     DME Arranged:    DME Agency:     HH Arranged:    HH Agency:     Status of Service:     Medicare Important Message Given:    Date Medicare IM Given:    Medicare IM give by:    Date Additional Medicare IM Given:    Additional Medicare Important Message give by:     If discussed at Long Length of Stay Meetings, dates discussed:    Additional Comments:  Airiana Elman A, RN 07/24/2015, 3:22 PM

## 2015-07-25 ENCOUNTER — Encounter: Payer: Self-pay | Admitting: Radiology

## 2015-07-25 ENCOUNTER — Inpatient Hospital Stay: Payer: Medicare PPO

## 2015-07-25 LAB — BASIC METABOLIC PANEL
ANION GAP: 6 (ref 5–15)
BUN: 15 mg/dL (ref 6–20)
CO2: 28 mmol/L (ref 22–32)
Calcium: 9.4 mg/dL (ref 8.9–10.3)
Chloride: 106 mmol/L (ref 101–111)
Creatinine, Ser: 1.35 mg/dL — ABNORMAL HIGH (ref 0.61–1.24)
GFR calc Af Amer: 59 mL/min — ABNORMAL LOW (ref >60)
GFR calc non Af Amer: 51 mL/min — ABNORMAL LOW (ref >60)
GLUCOSE: 112 mg/dL — AB (ref 65–99)
POTASSIUM: 3.7 mmol/L (ref 3.5–5.1)
Sodium: 140 mmol/L (ref 135–145)

## 2015-07-25 LAB — VITAMIN B12: Vitamin B-12: 390 pg/mL (ref 180–914)

## 2015-07-25 MED ORDER — ATORVASTATIN CALCIUM 20 MG PO TABS
40.0000 mg | ORAL_TABLET | Freq: Every day | ORAL | Status: DC
  Administered 2015-07-25 – 2015-07-26 (×2): 40 mg via ORAL
  Filled 2015-07-25 (×2): qty 2

## 2015-07-25 MED ORDER — HYDROCHLOROTHIAZIDE 25 MG PO TABS: 25.0000 mg | ORAL_TABLET | Freq: Every day | ORAL | Status: DC

## 2015-07-25 MED ORDER — QUINAPRIL HCL 10 MG PO TABS
40.0000 mg | ORAL_TABLET | Freq: Every day | ORAL | Status: DC
Start: 2015-07-25 — End: 2015-07-27
  Administered 2015-07-25 – 2015-07-26 (×2): 40 mg via ORAL
  Filled 2015-07-25 (×3): qty 4

## 2015-07-25 MED ORDER — CLOPIDOGREL BISULFATE 75 MG PO TABS
75.0000 mg | ORAL_TABLET | Freq: Every day | ORAL | Status: DC
  Administered 2015-07-25 – 2015-07-27 (×3): 75 mg via ORAL
  Filled 2015-07-25 (×3): qty 1

## 2015-07-25 MED ORDER — SODIUM CHLORIDE 0.9 % IV SOLN
INTRAVENOUS | Status: AC
  Administered 2015-07-25 (×2): via INTRAVENOUS

## 2015-07-25 MED ORDER — MECLIZINE HCL 25 MG PO TABS
12.5000 mg | ORAL_TABLET | Freq: Two times a day (BID) | ORAL | Status: DC
  Administered 2015-07-25 – 2015-07-26 (×4): 12.5 mg via ORAL
  Filled 2015-07-25 (×5): qty 1

## 2015-07-25 MED ORDER — IOHEXOL 350 MG/ML SOLN
80.0000 mL | Freq: Once | INTRAVENOUS | Status: AC | PRN
  Administered 2015-07-25: 80 mL via INTRAVENOUS

## 2015-07-25 MED ORDER — HYDROCODONE-ACETAMINOPHEN 5-325 MG PO TABS
1.0000 | ORAL_TABLET | ORAL | Status: DC | PRN
  Administered 2015-07-26 – 2015-07-27 (×2): 1 via ORAL
  Filled 2015-07-25 (×2): qty 1

## 2015-07-25 NOTE — Progress Notes (Signed)
Physical Therapy Treatment Patient Details Name: Monroe Qin MRN: [id number] DOB: [birth year] Today's Date: 07/25/2015    History of Present Illness Pt is 69 year old male with a known history of essential hypertension presenting with L hand clumsiness. One day duration left hand clumsiness with associated headache described as aching, 4/10, non radiating, no worsening/relieving factors. No further symptoms. Some improvement since onset but still present..  He has a hx of depression and IBS.  CT negative and having MRI today.    PT Comments    Pt received asleep upon entry, wife and visitor in room. Pt easily arousable, however, remains very drowsy, delayed response time, multiple yawns, and moderately dysarthric which wife reports seems much worse today. Pt demonstrating significantly more weakness and powerout on LLE. Consulted RN who reports that patient is not currently on any meds that can easily explain this recent change in alertness. Treatment session was initially targeted to improve ambulation, however pt is not appropriate for gait training at this time due to increased level of assistance required to come to standing. Pt presenting with impaired fine motor coordination, strength, power output, trunk stability, gross motor coordination, and activity tolerance, and will continue to benefit from skilled PT intervention to restore to PLOF.   Follow Up Recommendations  CIR     Equipment Recommendations  None recommended by PT    Recommendations for Other Services Rehab consult     Precautions / Restrictions Precautions Precautions: Fall Precaution Comments: Monitor response to cervical AROM as it pertains to recent findings regarding vertebral aa stenosis.  Restrictions Weight Bearing Restrictions: No    Mobility  Bed Mobility Overal bed mobility: +2 for physical assistance;Needs Assistance Bed Mobility: Supine to Sit;Sit to Supine     Supine to sit: Total assist;+2 for  physical assistance Sit to supine: +2 for physical assistance;Total assist   General bed mobility comments: Appears to be moderately more weak than at evaluation yesterday. Very limited capacity to maintain seated balance regardless of UE support.   Transfers Overall transfer level: Needs assistance Equipment used: Rolling walker (2 wheeled);None (Collapsing to the Left, imability to self correct; removed RW to practice with double knee block ) Transfers: Sit to/from Stand Sit to Stand: Max assist         General transfer comment: Dependent style transfer 6 time with 30 seconds standing each time. Difficulty maintaining L TKE, unable to correct L trunk collapse, difficulty picking head up even with repeated cues.   Ambulation/Gait             General Gait Details: unable even with MaxA, too unstable.    Stairs            Wheelchair Mobility    Modified Rankin (Stroke Patients Only)       Balance Overall balance assessment: Needs assistance   Sitting balance-Leahy Scale: Zero       Standing balance-Leahy Scale: Zero Standing balance comment: req total assist and double knee block; manual correction of trunk flexion at the pelvis and L lateral lean.                     Cognition Arousal/Alertness: Lethargic Behavior During Therapy:  (delayed processing time, slurred speech. ) Overall Cognitive Status: Impaired/Different from baseline Area of Impairment:  (decreased arousal and focus. )                    Exercises      General Comments  Pertinent Vitals/Pain Pain Assessment: 0-10 Pain Score: 10-Worst pain ever Pain Location: headache Pain Descriptors / Indicators: Aching    Home Living                      Prior Function            PT Goals (current goals can now be found in the care plan section) Acute Rehab PT Goals Patient Stated Goal: "to get better and hopefully go home" PT Goal Formulation: With  patient/family Time For Goal Achievement: 08/07/15 Potential to Achieve Goals: Good Progress towards PT goals: Not progressing toward goals - comment    Frequency  7X/week    PT Plan Current plan remains appropriate    Co-evaluation             End of Session Equipment Utilized During Treatment: Gait belt Activity Tolerance: Patient limited by lethargy;Patient limited by fatigue Patient left: in bed;with call bell/phone within reach;with family/visitor present;with bed alarm set     Time: [id number] PT Time Calculation (min) (ACUTE ONLY): 30 min  Charges:  $Therapeutic Activity: 23-37 mins                    G Codes:      Moxon Messler C 07/25/2015, 4:56 PM 5:02 PM  Rosamaria LintsAllan C Fong Mccarry, PT, DPT [place] License # [id number]

## 2015-07-25 NOTE — [place] FL2 (Signed)
[place] MEDICAID FL2 LEVEL OF CARE SCREENING TOOL     IDENTIFICATION  Patient Name: Maurice Moses Birthdate: [birth year] Sex: male Admission Date (Current Location): 07/23/2015  [facility] and [place] Number: Administrator and Address:  [facility], [address] [place], [place] [address]      Provider Number: [id number]  Attending Physician Name and Address:  Auburn Bilberry, MD  Relative Name and Phone Number:       Current Level of Care: Hospital Recommended Level of Care: Skilled Nursing Facility Prior Approval Number:    Date Approved/Denied:   PASRR Number:    Discharge Plan: Home    Current Diagnoses: Patient Active Problem List   Diagnosis Date Noted  . 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 TIA (transient ischemic attack) 07/23/2015    Orientation ACTIVITIES/SOCIAL BLADDER RESPIRATION    Self, Time, Situation  Active Continent Normal  BEHAVIORAL SYMPTOMS/MOOD NEUROLOGICAL BOWEL NUTRITION STATUS      Continent Diet (Heart Healthy)  PHYSICIAN VISITS COMMUNICATION OF NEEDS Height & Weight Skin  30 days Verbally  (172.7 cm) 231 lbs. Normal          AMBULATORY STATUS RESPIRATION    Assist extensive Normal      Personal Care Assistance Level of Assistance  Bathing, Feeding, Dressing Bathing Assistance: Limited assistance Feeding assistance: Limited assistance Dressing Assistance: Limited assistance      Functional Limitations Info                SPECIAL CARE FACTORS FREQUENCY  PT (By licensed PT), OT (By licensed OT)     PT Frequency: 5 OT Frequency: 5           Additional Factors Info  Code Status, Allergies, Psychotropic Code Status Info: Full Code  Allergies Info: Allergies Psychotropic Info: PO antidepressants         Current Medications (07/25/2015):  This is the current hospital active medication list Current Facility-Administered Medications  Medication Dose Route Frequency  Provider Last Rate Last Dose  . 0.9 %  sodium chloride infusion   Intravenous Continuous Mellody Drown, MD 75 mL/hr at 07/25/15 0930    . acetaminophen (TYLENOL) tablet 650 mg  650 mg Oral Q6H PRN Wyatt Haste, MD       Or  . acetaminophen (TYLENOL) suppository 650 mg  650 mg Rectal Q6H PRN Wyatt Haste, MD      . aspirin EC tablet 81 mg  81 mg Oral Daily Wyatt Haste, MD   81 mg at 07/25/15 0902  . atorvastatin (LIPITOR) tablet 40 mg  40 mg Oral q1800 Mellody Drown, MD      . clopidogrel (PLAVIX) tablet 75 mg  75 mg Oral Daily Mellody Drown, MD   75 mg at 07/25/15 8119  . docusate sodium (COLACE) capsule 100 mg  100 mg Oral BID Wyatt Haste, MD   100 mg at 07/25/15 1478  . enoxaparin (LOVENOX) injection 40 mg  40 mg Subcutaneous Q24H Auburn Bilberry, MD   40 mg at 07/24/15 1957  . escitalopram (LEXAPRO) tablet 20 mg  20 mg Oral Daily Wyatt Haste, MD   20 mg at 07/24/15 1956  . hydrALAZINE (APRESOLINE) injection 10 mg  10 mg Intravenous Q4H PRN Wyatt Haste, MD      . HYDROcodone-acetaminophen (NORCO/VICODIN) 5-325 MG per tablet 1 tablet  1 tablet Oral Q4H PRN Auburn Bilberry, MD      . loratadine (CLARITIN) tablet 10 mg  10 mg Oral Daily Wyatt Hasteavid K Hower, MD   10 mg at 07/25/15 [id number]  . LORazepam (ATIVAN) tablet 2 mg  2 mg Oral Q4H PRN Arnaldo NatalMichael S Diamond, MD   2 mg at 07/25/15 [id number]  . meclizine (ANTIVERT) tablet 12.5 mg  12.5 mg Oral BID Mellody DrownMatthew Smith, MD   12.5 mg at 07/25/15 0902  . mirtazapine (REMERON SOL-TAB) disintegrating tablet 30 mg  30 mg Oral QHS Wyatt Hasteavid K Hower, MD   30 mg at 07/24/15 1957  . pantoprazole (PROTONIX) EC tablet 40 mg  40 mg Oral Daily Wyatt Hasteavid K Hower, MD   40 mg at 07/25/15 [id number]  . sodium chloride 0.9 % injection 3 mL  3 mL Intravenous Q12H Wyatt Hasteavid K Hower, MD   3 mL at 07/25/15 [id number]     Discharge Medications: Please see discharge summary for a list of discharge medications.  Relevant Imaging Results:  Relevant Lab Results:  Recent Labs    Additional  Information    Dede QuerySarah Ludmila Ebarb, LCSW

## 2015-07-25 NOTE — Clinical Social Work Placement (Signed)
   CLINICAL SOCIAL WORK PLACEMENT  NOTE  Date:  07/25/2015  Patient Details  Name: Maurice FootsClaude Delval MRN: [id number] Date of Birth: 09/05/1945  Clinical Social Work is seeking post-discharge placement for this patient at the Skilled  Nursing Facility level of care (*CSW will initial, date and re-position this form in  chart as items are completed):  Yes   Patient/family provided with [place] Clinical Social Work Department's list of facilities offering this level of care within the geographic area requested by the patient (or if unable, by the patient's family).  Yes   Patient/family informed of their freedom to choose among providers that offer the needed level of care, that participate in Medicare, Medicaid or managed care program needed by the patient, have an available bed and are willing to accept the patient.  Yes   Patient/family informed of [place]'s ownership interest in [facility] and [facility], as well as of the fact that they are under no obligation to receive care at these facilities.  PASRR submitted to EDS on 07/25/15     PASRR number received on       Existing PASRR number confirmed on       FL2 transmitted to all facilities in geographic area requested by pt/family on 07/25/15     FL2 transmitted to all facilities within larger geographic area on       Patient informed that his/her managed care company has contracts with or will negotiate with certain facilities, including the following:            Patient/family informed of bed offers received.  Patient chooses bed at       Physician recommends and patient chooses bed at      Patient to be transferred to   on  .  Patient to be transferred to facility by       Patient family notified on   of transfer.  Name of family member notified:        PHYSICIAN       Additional Comment:    _______________________________________________ Dede QuerySarah Julianny Milstein, LCSW 07/25/2015, 1:31 PM

## 2015-07-25 NOTE — Progress Notes (Addendum)
I spoke with Dr. Eliane DecreeS. Patel for therapy recommendation of an inpt rehab assessment. I will place order and contact pt/wife to arrange time today for onsite assessment. [phone] I contacted pt's wife by phone in pt room to arrange a time for onsite assessment for inpt rehab admission. She states [place] [facility] [place] [facility] [place] [facility]. I will contact RN CM and SW that wife is requesting SNF at this time. I will sign off. [phone]

## 2015-07-25 NOTE — Progress Notes (Signed)
Speech Therapy Note: received order, reviewed chart notes and consulted NSG re: pt's status today. Met w/ pt's wife who denied any trouble swallowing stating that pt was eating/drinking "fine even though he has a hard time sitting up to do it". Wife was indicating balance issues. NSG agreed reporting pt was eating his meals and taking meds appropriately w/ no overt s/s of aspiration noted.  Pt was sleeping in bed during this visit; wife stated he was awake during the night w/ agitation and that he has been taking meds such as Vicadon, Morphine. Pt awakened minimally and mumbled that he wanted "my meds"; NSG informed and gave something for anxiety. NSG and Wife stated pt was communicating his wants/needs appropriately but needed to be reminded of his physical limitations post Cerebellar infarct.  No skilled ST services indicated at this time sec. To pt's presentation appearing close to his baseline in light of hospitalization and depression/anxiety. ST will be available for further consult if needs indicate. NSG agreed to reconsult. Wife agreed.

## 2015-07-25 NOTE — Plan of Care (Signed)
Problem: Self-Care: Goal: Ability to participate in self-care as condition permits will improve Outcome: Progressing NIH 2 this shift

## 2015-07-25 NOTE — Progress Notes (Addendum)
Pt not in room  MRI personally reviewed by me as well as labs  1.  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  Likely cardioembolic -  Continue above -  Needs TEE and if neg, loop recorder for 90 days

## 2015-07-25 NOTE — Progress Notes (Signed)
Occupational Therapy Treatment Patient Details Name: Maurice Moses MRN: [id number] DOB: 01/25/1946 Today's Date: 07/25/2015    History of present illness Pt is 69 year old male with a known history of essential hypertension presenting with L hand clumsiness. One day duration left hand clumsiness with associated headache described as aching, 4/10, non radiating, no worsening/relieving factors. No further symptoms. Some improvement since onset but still present..  He has a hx of depression and IBS.  CT negative and having MRI today.   OT comments  Completted grasp release in all planes with activities of daily living objects.  Patient often over shooting object. Overshooting decreases when weight added to wrist (manually provided by therapist). Cues for technique and to stay on task.  Follow Up Recommendations  CIR    Equipment Recommendations       Recommendations for Other Services      Precautions / Restrictions Restrictions Weight Bearing Restrictions: No       Mobility Bed Mobility                  Transfers                      Balance                                   ADL                                                Vision                     Perception     Praxis      Cognition   Behavior During Therapy: WFL for tasks assessed/performed                         Extremity/Trunk Assessment               Exercises     Shoulder Instructions       General Comments      Pertinent Vitals/ Pain          Home Living                                          Prior Functioning/Environment              Frequency       Progress Toward Goals  OT Goals(current goals can now be found in the care plan section)        Plan      Co-evaluation                 End of Session     Activity Tolerance     Patient Left     Nurse Communication           Time: 1024-1040 OT Time Calculation (min): 16 min  Charges: OT General Charges $OT Visit: 1 Procedure OT Treatments $Neuromuscular Re-education: 8-22 mins  Maurice Moses, Maurice [place] M Dj Senteno M Kodee Drury, MS/OTR/L  07/25/2015, 10:46 AM

## 2015-07-25 NOTE — Progress Notes (Signed)
[facility] [place] [facility]                                                                                                                                                                                            Patient Demographics   Maurice FootsClaude Moses, is a 69 y.o. male, DOB - 03/12/1946, [id number]  Admit date - 07/23/2015   Admitting Physician Wyatt Hasteavid K Hower, MD  Outpatient Primary MD for the patient is No primary care provider on file.   LOS - 1  Subjective: Patient had a MRI which confirmed CVA, still having some weakness in left hand Review of Systems:   CONSTITUTIONAL: No documented fever. No fatigue, weakness. No weight gain, no weight loss.  EYES: No blurry or double vision.  ENT: No tinnitus. No postnasal drip. No redness of the oropharynx.  RESPIRATORY: No cough, no wheeze, no hemoptysis. No dyspnea.  CARDIOVASCULAR: No chest pain. No orthopnea. No palpitations. No syncope.  GASTROINTESTINAL: No nausea, no vomiting or diarrhea. No abdominal pain. No melena or hematochezia.  GENITOURINARY: No dysuria or hematuria.  ENDOCRINE: No polyuria or nocturia. No heat or cold intolerance.  HEMATOLOGY: No anemia. No bruising. No bleeding.  INTEGUMENTARY: No rashes. No lesions.  MUSCULOSKELETAL: No arthritis. No swelling. No gout.  NEUROLOGIC: No numbness, tingling, or ataxia. No seizure-type activity. Left hand with poor control PSYCHIATRIC: No anxiety. No insomnia. No ADD.    Vitals:   Filed Vitals:   07/25/15 0413 07/25/15 0757 07/25/15 1028 07/25/15 1314  BP:  160/75 162/82 158/83  Pulse: 88 80 79 86  Temp: 99.7 F (37.6 C) 98.9 F (37.2 C) 98.8 F (37.1 C) 99 F (37.2 C)  TempSrc: Oral Oral Oral Oral  Resp:  20 18 20   Height:      Weight:      SpO2: 94% 93% 94% 93%    Wt Readings from Last 3 Encounters:  07/23/15 104.826 kg (231 lb 1.6 oz)     Intake/Output Summary (Last 24 hours) at 07/25/15 1344 Last data filed at 07/25/15  0900  Gross per 24 hour  Intake    240 ml  Output      0 ml  Net    240 ml    Physical Exam:   GENERAL: Pleasant-appearing in no apparent distress.  HEAD, EYES, EARS, NOSE AND THROAT: Atraumatic, normocephalic. Extraocular muscles are intact. Pupils equal and reactive to light. Sclerae anicteric. No conjunctival injection. No oro-pharyngeal erythema.  NECK: Supple. There is no jugular venous distention. No bruits, no lymphadenopathy, no thyromegaly.  HEART: Regular rate and rhythm,. No murmurs, no rubs,  no clicks.  LUNGS: Clear to auscultation bilaterally. No rales or rhonchi. No wheezes.  ABDOMEN: Soft, flat, nontender, nondistended. Has good bowel sounds. No hepatosplenomegaly appreciated.  EXTREMITIES: No evidence of any cyanosis, clubbing, or peripheral edema.  +2 pedal and radial pulses bilaterally.  NEUROLOGIC: The patient is alert, awake, and oriented x3 with no focal motor or sensory deficits appreciated bilaterally.  SKIN: Moist and warm with no rashes appreciated.  Psych: Not anxious, depressed LN: No inguinal LN enlargement    Antibiotics   Anti-infectives    None      Medications   Scheduled Meds: . aspirin EC  81 mg Oral Daily  . atorvastatin  40 mg Oral q1800  . clopidogrel  75 mg Oral Daily  . docusate sodium  100 mg Oral BID  . enoxaparin (LOVENOX) injection  40 mg Subcutaneous Q24H  . escitalopram  20 mg Oral Daily  . loratadine  10 mg Oral Daily  . meclizine  12.5 mg Oral BID  . mirtazapine  30 mg Oral QHS  . pantoprazole  40 mg Oral Daily  . sodium chloride  3 mL Intravenous Q12H   Continuous Infusions: . sodium chloride 75 mL/hr at 07/25/15 0930   PRN Meds:.acetaminophen **OR** acetaminophen, hydrALAZINE, HYDROcodone-acetaminophen, LORazepam   Data Review:   Micro Results No results found for this or any previous visit (from the past 240 hour(s)).  Radiology Reports Ct Head Wo Contrast  07/23/2015  CLINICAL DATA:  Nausea and vomiting today.  Episode of left arm weakness last night. Altered mental status last night. EXAM: CT HEAD WITHOUT CONTRAST TECHNIQUE: Contiguous axial images were obtained from the base of the skull through the vertex without intravenous contrast. COMPARISON:  None. FINDINGS: Diffusely enlarged ventricles and subarachnoid spaces. No intracranial hemorrhage, mass lesion or CT evidence of acute infarction. Bilateral ethmoid sinus mucosal thickening. Unremarkable bones. IMPRESSION: 1. No acute abnormality. 2. Moderate diffuse cerebral atrophy and mild diffuse cerebellar atrophy. 3. Mild chronic bilateral ethmoid sinusitis. Electronically Signed   By: Beckie Salts M.D.   On: 07/23/2015 19:36   Mr Maxine Glenn Head Wo Contrast  07/24/2015  CLINICAL DATA:  BILATERAL cerebellar infarcts. Continued surveillance. EXAM: MRA HEAD WITHOUT CONTRAST TECHNIQUE: Angiographic images of the Circle of Willis were obtained using MRA technique without intravenous contrast. COMPARISON:  MRI brain 07/24/2015.  CT head 07/23/2015. FINDINGS: The internal carotid arteries are dolichoectatic but widely patent throughout their cervical, petrous, cavernous and supraclinoid segments. Signal loss in the vertical petrous segment of the LEFT ICA appears artifactual on axial source images. There is focal narrowing, estimated 50-75% stenosis, of the basilar artery in its proximal aspect, extending over a length of approximately 5 mm from just above the vertebral confluence to just below the anterior inferior cerebellar artery origins. There is diminished caliber also of the distal V4 segment of the RIGHT vertebral, estimated 50-75% stenosis. Intracranial atherosclerosis is suspected. The RIGHT M1 MCA is widely patent. The LEFT M1 MCA shows focal narrowing in its distal M1 segment, suspected 50% stenosis. Poor flow related enhancement in the BILATERAL M2 and M3 MCA segments is probably technical in nature rather than representing intracranial atherosclerosis, based on  review of the axial source images. Both anterior cerebral arteries are patent but mildly irregular, more so on the RIGHT. No flow-limiting stenosis is evident. Both posterior cerebral arteries originate from the basilar tip. Minor non stenotic irregularity is observed on the RIGHT. Focal high-grade tandem stenoses of the LEFT P2 PCA segment estimated to be  75-90%, more severe distally. Both superior cerebellar artery origins appear widely patent. Both vessels are observed for at least 2 cm from the origin and do not appear significantly diseased. The RIGHT and LEFT anterior inferior cerebellar arteries both appear patent based on axial source images but are poorly seen on MIP images. BILATERAL PICA vessels are poorly visualized. No intracranial aneurysm. 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 Intracranial atherosclerosis is suspected. Other areas of intracranial atherosclerotic narrowing include LEFT M1 MCA (50%), and LEFT P2 PCA segment (75-90% tandem lesions). No proximal flow reducing lesion of the LEFT or RIGHT superior cerebellar arteries is observed. Electronically Signed   By: Elsie Stain M.D.   On: 07/24/2015 16:09   Mr Brain Wo Contrast  07/24/2015  CLINICAL DATA:  Possible head injury 5 days ago. History of hypertension. Hand clumsiness for 2 days. Headache. EXAM: MRI HEAD WITHOUT CONTRAST TECHNIQUE: Multiplanar, multiecho pulse sequences of the brain and surrounding structures were obtained without intravenous contrast. COMPARISON:  CT head 07/23/2015. FINDINGS: Focal and confluent areas of restricted diffusion affect the LEFT greater than RIGHT cerebellum. The largest area of confluent abnormal signal involves the LEFT superior cerebellar hemisphere, LEFT SCA distribution. Similar lesser involvement RIGHT SCA territory, with a wedge-shaped cortical infarct, more laterally. Cerebellar deep white matter involvement on the LEFT involves  a portion of the dentate nucleus. No acute infarction of the vermis, or inferior cerebellum. No brainstem or supratentorial involvement. No hemorrhagic transformation. Generalized atrophy with hydrocephalus ex vacuo. Minor white matter disease, likely chronic microvascular ischemic change. No chronic hemorrhage. Flow voids are maintained in the carotid, basilar, and both vertebral arteries. No midline abnormality. Minor chronic sinus disease. Minor RIGHT mastoid effusion. Negative orbits. Extracranial soft tissues unremarkable. Compared with yesterday's CT, the areas of infarction are not clearly visible. IMPRESSION: Focal and confluent areas of acute nonhemorrhagic infarction affecting the BILATERAL cerebellar hemispheres, with greatest involvement in the LEFT superior cerebellar artery territory. No brainstem involvement. Given the different vascular territories involved, shower of emboli is suspected. No posttraumatic sequelae are evident. No appreciable stenosis or occlusion of the BILATERAL distal vertebral arteries and basilar artery. If further investigation desired, consider extracranial and intracranial MRA for further evaluation. Electronically Signed   By: Elsie Stain M.D.   On: 07/24/2015 13:06     CBC  Recent Labs Lab 07/23/15 1800  WBC 9.9  HGB 14.8  HCT 42.8  PLT 263  MCV 89.0  MCH 30.8  MCHC 34.5  RDW 12.9  LYMPHSABS 1.0  MONOABS 0.5  EOSABS 0.0  BASOSABS 0.1    Chemistries   Recent Labs Lab 07/23/15 1800 07/25/15 0923  NA 138 140  K 3.7 3.7  CL 103 106  CO2 30 28  GLUCOSE 108* 112*  BUN 21* 15  CREATININE 1.33* 1.35*  CALCIUM 9.7 9.4   ------------------------------------------------------------------------------------------------------------------ estimated creatinine clearance is 58.9 mL/min (by C-G formula based on Cr of 1.35). ------------------------------------------------------------------------------------------------------------------  Recent  Labs  07/24/15 0454  HGBA1C 5.3   ------------------------------------------------------------------------------------------------------------------  Recent Labs  07/24/15 0454  CHOL 217*  HDL 40*  LDLCALC 138*  TRIG 196*  CHOLHDL 5.4   ------------------------------------------------------------------------------------------------------------------ No results for input(s): TSH, T4TOTAL, T3FREE, THYROIDAB in the last 72 hours.  Invalid input(s): FREET3 ------------------------------------------------------------------------------------------------------------------ No results for input(s): VITAMINB12, FOLATE, FERRITIN, TIBC, IRON, RETICCTPCT in the last 72 hours.  Coagulation profile No results for input(s): INR, PROTIME in the last 168 hours.  No results for  input(s): DDIMER in the last 72 hours.  Cardiac Enzymes No results for input(s): CKMB, TROPONINI, MYOGLOBIN in the last 168 hours.  Invalid input(s): CK ------------------------------------------------------------------------------------------------------------------ Invalid input(s): POCBNP    Assessment & Plan   69 year old gentleman with history of essential hypertension presenting with left hand clumsiness  1. Acute CVA cerebellar felt to be embolic due to arthrosclerotic the disease appreciate neurology neuro input, CTA of the head ordered continue aspirin and Plavix  2. Essential hypertension:Restart home blood pressure medications  3. gerd without esophagitis: ppi therapy 4. Venous thromboembolism prophylactic: scd     Code Status Orders        Start     Ordered   07/23/15 2046  Full code   Continuous     07/23/15 2045    Advance Directive Documentation        Most Recent Value   Type of Advance Directive  Healthcare Power of Attorney   Pre-existing out of facility DNR order (yellow form or pink MOST form)     "MOST" Form in Place?             Consults neurology  DVT Prophylaxis start  Lovenox  Lab Results  Component Value Date   PLT 263 07/23/2015     Time Spent in minutes   Auburn Bilberry M.D on 07/25/2015 at 1:44 PM  Between 7am to 6pm - Pager - [phone]  After 6pm go to [email or web address] - password [email or web address] [email or web address]  [facility] [place] [facility]   Office  [phone]

## 2015-07-26 LAB — HEPATIC FUNCTION PANEL
ALBUMIN: 3.3 g/dL — AB (ref 3.5–5.0)
ALT: 20 U/L (ref 17–63)
AST: 18 U/L (ref 15–41)
Alkaline Phosphatase: 72 U/L (ref 38–126)
BILIRUBIN TOTAL: 0.7 mg/dL (ref 0.3–1.2)
Bilirubin, Direct: 0.1 mg/dL (ref 0.1–0.5)
Indirect Bilirubin: 0.6 mg/dL (ref 0.3–0.9)
TOTAL PROTEIN: 6.1 g/dL — AB (ref 6.5–8.1)

## 2015-07-26 LAB — BASIC METABOLIC PANEL
Anion gap: 6 (ref 5–15)
BUN: 15 mg/dL (ref 6–20)
CALCIUM: 8.9 mg/dL (ref 8.9–10.3)
CO2: 27 mmol/L (ref 22–32)
CREATININE: 1.34 mg/dL — AB (ref 0.61–1.24)
Chloride: 106 mmol/L (ref 101–111)
GFR calc non Af Amer: 52 mL/min — ABNORMAL LOW (ref >60)
GFR, EST AFRICAN AMERICAN: 60 mL/min — AB (ref >60)
GLUCOSE: 100 mg/dL — AB (ref 65–99)
Potassium: 3.8 mmol/L (ref 3.5–5.1)
Sodium: 139 mmol/L (ref 135–145)

## 2015-07-26 LAB — TSH: TSH: 1.602 u[IU]/mL (ref 0.350–4.500)

## 2015-07-26 LAB — SEDIMENTATION RATE: SED RATE: 17 mm/h (ref 0–20)

## 2015-07-26 LAB — CK: CK TOTAL: 63 U/L (ref 49–397)

## 2015-07-26 MED ORDER — ASPIRIN 81 MG PO TBEC: 81.0000 mg | DELAYED_RELEASE_TABLET | Freq: Every day | ORAL | Status: AC

## 2015-07-26 MED ORDER — CLOPIDOGREL BISULFATE 75 MG PO TABS: 75.0000 mg | ORAL_TABLET | Freq: Every day | ORAL | Status: AC

## 2015-07-26 MED ORDER — ATORVASTATIN CALCIUM 40 MG PO TABS: 40.0000 mg | ORAL_TABLET | Freq: Every day | ORAL | Status: AC

## 2015-07-26 MED ORDER — HYDROCODONE-ACETAMINOPHEN 5-325 MG PO TABS: 1.0000 | ORAL_TABLET | ORAL | Status: DC | PRN

## 2015-07-26 MED ORDER — CARBIDOPA-LEVODOPA 25-100 MG PO TABS
1.0000 | ORAL_TABLET | Freq: Three times a day (TID) | ORAL | Status: DC
  Administered 2015-07-26 – 2015-07-27 (×4): 1 via ORAL
  Filled 2015-07-26 (×4): qty 1

## 2015-07-26 NOTE — Care Management Important Message (Signed)
Important Message  Patient Details  Name: Maurice Moses MRN: [id number] Date of Birth: 04/06/1946   Medicare Important Message Given:  Yes    [name] 07/26/2015, 10:30 AM

## 2015-07-26 NOTE — Progress Notes (Signed)
[facility] - [facility] at [facility]                                                                                                                                                                                            Patient Demographics   Maurice Moses, is a 69 y.o. male, DOB - January 21, 1946, ZOX:[id number]  Admit date - 07/23/2015   Admitting Physician Wyatt Haste, MD  Outpatient Primary MD for the patient is No primary care provider on file.   LOS - 2  Subjective: I had done the discharge for patient to be discharged to rehabilitation I was told by neurology that he needed a TEE so this is being arranged  Review of Systems:   CONSTITUTIONAL: No documented fever. No fatigue, weakness. No weight gain, no weight loss.  EYES: No blurry or double vision.  ENT: No tinnitus. No postnasal drip. No redness of the oropharynx.  RESPIRATORY: No cough, no wheeze, no hemoptysis. No dyspnea.  CARDIOVASCULAR: No chest pain. No orthopnea. No palpitations. No syncope.  GASTROINTESTINAL: No nausea, no vomiting or diarrhea. No abdominal pain. No melena or hematochezia.  GENITOURINARY: No dysuria or hematuria.  ENDOCRINE: No polyuria or nocturia. No heat or cold intolerance.  HEMATOLOGY: No anemia. No bruising. No bleeding.  INTEGUMENTARY: No rashes. No lesions.  MUSCULOSKELETAL: No arthritis. No swelling. No gout.  NEUROLOGIC: No numbness, tingling, or ataxia. No seizure-type activity. Left hand with poor control PSYCHIATRIC: No anxiety. No insomnia. No ADD.    Vitals:   Filed Vitals:   07/25/15 1807 07/25/15 2110 07/26/15 0518 07/26/15 1404  BP: 159/69 151/69 148/67 150/79  Pulse: 85 82 65 84  Temp: 98.6 F (37 C) 99.7 F (37.6 C) 97.6 F (36.4 C) 98.1 F (36.7 C)  TempSrc:  Oral Oral Oral  Resp: 18   18  Height:      Weight:      SpO2: 94% 95% 95% 97%    Wt Readings from Last 3 Encounters:  07/23/15 104.826 kg (231 lb 1.6 oz)     Intake/Output Summary  (Last 24 hours) at 07/26/15 1433 Last data filed at 07/26/15 1353  Gross per 24 hour  Intake    240 ml  Output      0 ml  Net    240 ml    Physical Exam:   GENERAL: Pleasant-appearing in no apparent distress.  HEAD, EYES, EARS, NOSE AND THROAT: Atraumatic, normocephalic. Extraocular muscles are intact. Pupils equal and reactive to light. Sclerae anicteric. No conjunctival injection. No oro-pharyngeal erythema.  NECK: Supple. There is no jugular venous distention. No bruits,  no lymphadenopathy, no thyromegaly.  HEART: Regular rate and rhythm,. No murmurs, no rubs, no clicks.  LUNGS: Clear to auscultation bilaterally. No rales or rhonchi. No wheezes.  ABDOMEN: Soft, flat, nontender, nondistended. Has good bowel sounds. No hepatosplenomegaly appreciated.  EXTREMITIES: No evidence of any cyanosis, clubbing, or peripheral edema.  +2 pedal and radial pulses bilaterally.  NEUROLOGIC: The patient is alert, awake, and oriented x3 with no focal motor or sensory deficits appreciated bilaterally.  SKIN: Moist and warm with no rashes appreciated.  Psych: Not anxious, depressed LN: No inguinal LN enlargement    Antibiotics   Anti-infectives    None      Medications   Scheduled Meds: . aspirin EC  81 mg Oral Daily  . atorvastatin  40 mg Oral q1800  . carbidopa-levodopa  1 tablet Oral TID  . clopidogrel  75 mg Oral Daily  . docusate sodium  100 mg Oral BID  . enoxaparin (LOVENOX) injection  40 mg Subcutaneous Q24H  . escitalopram  20 mg Oral Daily  . loratadine  10 mg Oral Daily  . meclizine  12.5 mg Oral BID  . mirtazapine  30 mg Oral QHS  . pantoprazole  40 mg Oral Daily  . quinapril  40 mg Oral QHS  . sodium chloride  3 mL Intravenous Q12H   Continuous Infusions:   PRN Meds:.acetaminophen **OR** acetaminophen, hydrALAZINE, HYDROcodone-acetaminophen, LORazepam   Data Review:   Micro Results No results found for this or any previous visit (from the past 240  hour(s)).  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 EXAM: CT ANGIOGRAPHY HEAD AND NECK TECHNIQUE: Multidetector CT imaging of the head and neck was performed using the standard protocol during bolus administration of intravenous contrast. Multiplanar CT image reconstructions and MIPs were obtained to evaluate the vascular anatomy. Carotid stenosis measurements (when applicable) are obtained utilizing NASCET criteria, using the distal internal carotid diameter as the denominator. CONTRAST:  80mL OMNIPAQUE IOHEXOL 350 MG/ML SOLN COMPARISON:  Head MRI and MRA 07/24/2015 FINDINGS: CT HEAD Brain: Cytotoxic edema is noted in the left greater than right cerebellar hemispheres corresponding to the acute infarcts demonstrated on yesterday's MRI. There is no significant posterior fossa mass effect. Cerebral atrophy is unchanged. There is no evidence of acute intracranial hemorrhage, mass, midline shift, or extra-axial fluid collection. Calvarium and skull base: No skull fracture or destructive osseous lesion. Paranasal sinuses: Mild bilateral ethmoid air cell mucosal thickening. Orbits: Unremarkable. CTA NECK Aortic arch: 3 vessel aortic arch. Brachiocephalic and subclavian arteries are widely patent. Right carotid system: Patent with minimal non stenotic plaque at the carotid bifurcation. Left carotid system: Patent with mild non stenotic calcified plaque at the carotid bifurcation. Vertebral arteries: Patent with the left being minimally larger than the right. No significant stenosis identified, although the vertebral artery origins are partially obscured bilaterally by streak artifact. Skeleton: Mild diffuse cervical disc degeneration. Other neck: Mild motion artifact and minimal dependent atelectasis in the visualized lungs. 4 mm right upper lobe lung nodule (series 6, image 3). CTA HEAD Anterior circulation: The  internal carotid arteries are patent from skullbase to carotid termini without stenosis. ACAs and MCAs are patent without evidence of significant stenosis. Mild distal left M1 stenosis on MRA is not confirmed. No intracranial aneurysm is identified. Posterior circulation: The intracranial vertebral arteries are widely patent and both contribute to the basilar. AICA and SCA origins are patent. Basilar  artery is patent without stenosis. A pair proximal basilar artery stenosis on MRA was likely artifactual. There is a small right posterior communicating artery. PCAs are patent with a severe proximal P2 stenosis on the left. No significant right PCA stenosis. Venous sinuses: Patent. Anatomic variants: None. Delayed phase: No abnormal enhancement. IMPRESSION: 1. No major intracranial arterial occlusion. Widely patent basilar artery, with appearance on earlier MRA being artifactual. 2. Severe proximal left P2 PCA stenosis. No significant anterior circulation stenosis. 3. No carotid or vertebral artery stenosis in the neck. 4. 4 mm right upper lobe lung nodule. If the patient is at high risk for bronchogenic carcinoma, follow-up chest CT at 1 year is recommended. If the patient is at low risk, no follow-up is needed. This recommendation follows the consensus statement: Guidelines for Management of Small Pulmonary Nodules Detected on CT Scans: A Statement from the Fleischner Society as published in Radiology 2005; 237:395-400. Electronically Signed   By: Sebastian AcheAllen  Grady M.D.   On: 07/25/2015 15:41   Ct Head Wo Contrast  07/23/2015  CLINICAL DATA:  Nausea and vomiting today. Episode of left arm weakness last night. Altered mental status last night. EXAM: CT HEAD WITHOUT CONTRAST TECHNIQUE: Contiguous axial images were obtained from the base of the skull through the vertex without intravenous contrast. COMPARISON:  None. FINDINGS: Diffusely enlarged ventricles and subarachnoid spaces. No intracranial hemorrhage, mass lesion  or CT evidence of acute infarction. Bilateral ethmoid sinus mucosal thickening. Unremarkable bones. IMPRESSION: 1. No acute abnormality. 2. Moderate diffuse cerebral atrophy and mild diffuse cerebellar atrophy. 3. Mild chronic bilateral ethmoid sinusitis. Electronically Signed   By: Beckie SaltsSteven  Reid M.D.   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 EXAM: CT ANGIOGRAPHY HEAD AND NECK TECHNIQUE: Multidetector CT imaging of the head and neck was performed using the standard protocol during bolus administration of intravenous contrast. Multiplanar CT image reconstructions and MIPs were obtained to evaluate the vascular anatomy. Carotid stenosis measurements (when applicable) are obtained utilizing NASCET criteria, using the distal internal carotid diameter as the denominator. CONTRAST:  80mL OMNIPAQUE IOHEXOL 350 MG/ML SOLN COMPARISON:  Head MRI and MRA 07/24/2015 FINDINGS: CT HEAD Brain: Cytotoxic edema is noted in the left greater than right cerebellar hemispheres corresponding to the acute infarcts demonstrated on yesterday's MRI. There is no significant posterior fossa mass effect. Cerebral atrophy is unchanged. There is no evidence of acute intracranial hemorrhage, mass, midline shift, or extra-axial fluid collection. Calvarium and skull base: No skull fracture or destructive osseous lesion. Paranasal sinuses: Mild bilateral ethmoid air cell mucosal thickening. Orbits: Unremarkable. CTA NECK Aortic arch: 3 vessel aortic arch. Brachiocephalic and subclavian arteries are widely patent. Right carotid system: Patent with minimal non stenotic plaque at the carotid bifurcation. Left carotid system: Patent with mild non stenotic calcified plaque at the carotid bifurcation. Vertebral arteries: Patent with the left being minimally larger than the right. No significant stenosis identified, although the vertebral artery  origins are partially obscured bilaterally by streak artifact. Skeleton: Mild diffuse cervical disc degeneration. Other neck: Mild motion artifact and minimal dependent atelectasis in the visualized lungs. 4 mm right upper lobe lung nodule (series 6, image 3). CTA HEAD Anterior circulation: The internal carotid arteries are patent from skullbase to carotid termini without stenosis. ACAs and MCAs are patent without evidence of significant stenosis. Mild distal left M1 stenosis on MRA is not confirmed. No intracranial aneurysm is  identified. Posterior circulation: The intracranial vertebral arteries are widely patent and both contribute to the basilar. AICA and SCA origins are patent. Basilar artery is patent without stenosis. A pair proximal basilar artery stenosis on MRA was likely artifactual. There is a small right posterior communicating artery. PCAs are patent with a severe proximal P2 stenosis on the left. No significant right PCA stenosis. Venous sinuses: Patent. Anatomic variants: None. Delayed phase: No abnormal enhancement. IMPRESSION: 1. No major intracranial arterial occlusion. Widely patent basilar artery, with appearance on earlier MRA being artifactual. 2. Severe proximal left P2 PCA stenosis. No significant anterior circulation stenosis. 3. No carotid or vertebral artery stenosis in the neck. 4. 4 mm right upper lobe lung nodule. If the patient is at high risk for bronchogenic carcinoma, follow-up chest CT at 1 year is recommended. If the patient is at low risk, no follow-up is needed. This recommendation follows the consensus statement: Guidelines for Management of Small Pulmonary Nodules Detected on CT Scans: A Statement from the Fleischner Society as published in Radiology 2005; 237:395-400. Electronically Signed   By: Sebastian Ache M.D.   On: 07/25/2015 15:41   Mr Maxine Glenn Head Wo Contrast  07/24/2015  CLINICAL DATA:  BILATERAL cerebellar infarcts. Continued surveillance. EXAM: MRA HEAD WITHOUT  CONTRAST TECHNIQUE: Angiographic images of the Circle of Willis were obtained using MRA technique without intravenous contrast. COMPARISON:  MRI brain 07/24/2015.  CT head 07/23/2015. FINDINGS: The internal carotid arteries are dolichoectatic but widely patent throughout their cervical, petrous, cavernous and supraclinoid segments. Signal loss in the vertical petrous segment of the LEFT ICA appears artifactual on axial source images. There is focal narrowing, estimated 50-75% stenosis, of the basilar artery in its proximal aspect, extending over a length of approximately 5 mm from just above the vertebral confluence to just below the anterior inferior cerebellar artery origins. There is diminished caliber also of the distal V4 segment of the RIGHT vertebral, estimated 50-75% stenosis. Intracranial atherosclerosis is suspected. The RIGHT M1 MCA is widely patent. The LEFT M1 MCA shows focal narrowing in its distal M1 segment, suspected 50% stenosis. Poor flow related enhancement in the BILATERAL M2 and M3 MCA segments is probably technical in nature rather than representing intracranial atherosclerosis, based on review of the axial source images. Both anterior cerebral arteries are patent but mildly irregular, more so on the RIGHT. No flow-limiting stenosis is evident. Both posterior cerebral arteries originate from the basilar tip. Minor non stenotic irregularity is observed on the RIGHT. Focal high-grade tandem stenoses of the LEFT P2 PCA segment estimated to be 75-90%, more severe distally. Both superior cerebellar artery origins appear widely patent. Both vessels are observed for at least 2 cm from the origin and do not appear significantly diseased. The RIGHT and LEFT anterior inferior cerebellar arteries both appear patent based on axial source images but are poorly seen on MIP images. BILATERAL PICA vessels are poorly visualized. No intracranial aneurysm. IMPRESSION: ,50-75% stenosis of the proximal basilar  artery as well as the distal RIGHT vertebral artery. Intracranial atherosclerosis is suspected. Other areas of intracranial atherosclerotic narrowing include LEFT M1 MCA (50%), and LEFT P2 PCA segment (75-90% tandem lesions). No proximal flow reducing lesion of the LEFT or RIGHT superior cerebellar arteries is observed. Electronically Signed   By: Elsie Stain M.D.   On: 07/24/2015 16:09   Mr Brain Wo Contrast  07/24/2015  CLINICAL DATA:  Possible head injury 5 days ago. History of hypertension. Hand clumsiness for 2 days. Headache. EXAM: MRI  HEAD WITHOUT CONTRAST TECHNIQUE: Multiplanar, multiecho pulse sequences of the brain and surrounding structures were obtained without intravenous contrast. COMPARISON:  CT head 07/23/2015. FINDINGS: Focal and confluent areas of restricted diffusion affect the LEFT greater than RIGHT cerebellum. The largest area of confluent abnormal signal involves the LEFT superior cerebellar hemisphere, LEFT SCA distribution. Similar lesser involvement RIGHT SCA territory, with a wedge-shaped cortical infarct, more laterally. Cerebellar deep white matter involvement on the LEFT involves a portion of the dentate nucleus. No acute infarction of the vermis, or inferior cerebellum. No brainstem or supratentorial involvement. No hemorrhagic transformation. Generalized atrophy with hydrocephalus ex vacuo. Minor white matter disease, likely chronic microvascular ischemic change. No chronic hemorrhage. Flow voids are maintained in the carotid, basilar, and both vertebral arteries. No midline abnormality. Minor chronic sinus disease. Minor RIGHT mastoid effusion. Negative orbits. Extracranial soft tissues unremarkable. Compared with yesterday's CT, the areas of infarction are not clearly visible. IMPRESSION: Focal and confluent areas of acute nonhemorrhagic infarction affecting the BILATERAL cerebellar hemispheres, with greatest involvement in the LEFT superior cerebellar artery territory. No  brainstem involvement. Given the different vascular territories involved, shower of emboli is suspected. No posttraumatic sequelae are evident. No appreciable stenosis or occlusion of the BILATERAL distal vertebral arteries and basilar artery. If further investigation desired, consider extracranial and intracranial MRA for further evaluation. Electronically Signed   By: Elsie Stain M.D.   On: 07/24/2015 13:06     CBC  Recent Labs Lab 07/23/15 1800  WBC 9.9  HGB 14.8  HCT 42.8  PLT 263  MCV 89.0  MCH 30.8  MCHC 34.5  RDW 12.9  LYMPHSABS 1.0  MONOABS 0.5  EOSABS 0.0  BASOSABS 0.1    Chemistries   Recent Labs Lab 07/23/15 1800 07/25/15 0923 07/26/15 0917  NA 138 140 139  K 3.7 3.7 3.8  CL 103 106 106  CO2 GLUCOSE 108* 112* 100*  BUN 21* 15 15  CREATININE 1.33* 1.35* 1.34*  CALCIUM 9.7 9.4 8.9   ------------------------------------------------------------------------------------------------------------------ estimated creatinine clearance is 59.4 mL/min (by C-G formula based on Cr of 1.34). ------------------------------------------------------------------------------------------------------------------  Recent Labs  07/24/15 0454  HGBA1C 5.3   ------------------------------------------------------------------------------------------------------------------  Recent Labs  07/24/15 0454  CHOL 217*  HDL 40*  LDLCALC 138*  TRIG 196*  CHOLHDL 5.4   ------------------------------------------------------------------------------------------------------------------ No results for input(s): TSH, T4TOTAL, T3FREE, THYROIDAB in the last 72 hours.  Invalid input(s): FREET3 ------------------------------------------------------------------------------------------------------------------  Recent Labs  07/25/15 0923  VITAMINB12 390    Coagulation profile No results for input(s): INR, PROTIME in the last 168 hours.  No results for input(s): DDIMER in the  last 72 hours.  Cardiac Enzymes No results for input(s): CKMB, TROPONINI, MYOGLOBIN in the last 168 hours.  Invalid input(s): CK ------------------------------------------------------------------------------------------------------------------ Invalid input(s): POCBNP    Assessment & Plan   69 year old gentleman with history of essential hypertension presenting with left hand clumsiness  1. 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 Transthoracic echo is completely normal  2. Essential hypertension:Restart home blood pressure medications  3. gerd without esophagitis: ppi therapy 4. Venous thromboembolism prophylactic: scd     Code Status Orders        Start     Ordered   07/23/15 2046  Full code   Continuous     07/23/15 2045    Advance Directive Documentation  Most Recent Value   Type of Advance Directive  Healthcare Power of Attorney   Pre-existing out of facility DNR order (yellow form or pink MOST form)     "MOST" Form in Place?             Consults neurology  DVT Prophylaxis start Lovenox  Lab Results  Component Value Date   PLT 263 07/23/2015     Time Spent in minutes   Auburn Bilberry M.D on 07/26/2015 at 2:33 PM  Between 7am to 6pm - Pager - [phone]  After 6pm go to [email or web address] - password [email or web address] [facility]  [facility] [place] [facility]   Office  [phone]

## 2015-07-26 NOTE — Clinical Social Work Note (Signed)
Clinical Social Work Assessment  Patient Details  Name: Maurice Moses MRN: [id number] Date of Birth: 08/04/1946  Date of referral:  07/25/15               Reason for consult:  Facility Placement                Permission sought to share information with:  Family Supports Permission granted to share information::  Yes, Verbal Permission Granted  Name::      (wife)   Housing/Transportation Living arrangements for the past 2 months:  [place] of Information:  Patient, Spouse Patient Interpreter Needed:  None Criminal Activity/Legal Involvement Pertinent to Current Situation/Hospitalization:  No - Comment as needed Significant Relationships:  Spouse Lives with:  Spouse Do you feel safe going back to the place where you live?  Yes Need for family participation in patient care:  Yes (Comment)  Care giving concerns:  No care giving issues identified.    Social Worker assessment / plan:  CSW met with pt and wife on 07/25/2015 to address consult for SNF. PT recommended [facility], which was declined by pt's wife as pt would like to stay local. CSW explained the process of discharging to SNF with [employer]. CSW intitiated SNF search and will follow up with bed offers.   CSW will continue to follow.   Employment status:  Retired Nurse, adult PT Recommendations:  [place] / Referral to community resources:  [place]  Patient/Family's Response to care:  Pt and wife were appreciative of CSW support.   Patient/Family's Understanding of and Emotional Response to Diagnosis, Current Treatment, and Prognosis:  Pt and wife understand that pt needs STR prior to returning home.   Emotional Assessment Appearance:  Appears stated age Attitude/Demeanor/Rapport:  Other (Appropriate) Affect (typically observed):  Accepting Orientation:  Oriented to Self, Oriented to Place, Oriented to Situation Alcohol / Substance use:   Never Used Psych involvement (Current and /or in the community):  No (Comment)  Discharge Needs  Concerns to be addressed:  No discharge needs identified Readmission within the last 30 days:  No Current discharge risk:  None Barriers to Discharge:  No Barriers Identified   Darden Dates, LCSW 07/26/2015, 10:35 AM

## 2015-07-26 NOTE — Discharge Summary (Addendum)
Maurice Moses, 69 y.o., DOB 01/30/1946, MRN [id number]. Admission date: 07/23/2015 Discharge Date 07/27/2015 Primary MD No primary care provider on file. Admitting Physician Wyatt Haste, MD  Admission Diagnosis  Cerebral infarction due to unspecified mechanism [I63.9]  Discharge Diagnosis   Principal Problem:  Acute CVA Hyperlipidemia Essential hypertension GERD       Hospital Course Maurice Moses is a 69 y.o. male with a known history of essential hypertension presenting with hand clumsiness. One day duration left hand clumsiness with associated headache described as aching, 4/10, non radiating, no worsening/relieving factors. Patient underwent MRI of the brain which showed focal and confluent areas of acute nonhemorrhagic infarction affecting bilateral cerebellar hemisphere with greatest involvement in the left superior cerebral artery territory. Patient underwent echocardiogram of the heart which was negative. He had a MRA of the brain as well as CT angiogram. Which did show focal stenosis. Patient is very weak and deconditioned and needs further physical therapy and rehabilitation which is currently being arranged.            Consults  neurology  Significant Tests:  See full reports for all details    Ct Angio Head W/cm &/or Wo Cm  07/25/2015  CLINICAL DATA:  Acute bilateral cerebellar infarcts. 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 EXAM: CT ANGIOGRAPHY HEAD AND NECK TECHNIQUE: Multidetector CT imaging of the head and neck was performed using the standard protocol during bolus administration of intravenous contrast. Multiplanar CT image reconstructions and MIPs were obtained to evaluate the vascular anatomy. Carotid stenosis measurements (when applicable) are obtained utilizing NASCET criteria, using the distal internal carotid diameter as the denominator. CONTRAST:  80mL OMNIPAQUE IOHEXOL 350 MG/ML SOLN COMPARISON:  Head MRI and MRA 07/24/2015 FINDINGS: CT HEAD Brain: Cytotoxic  edema is noted in the left greater than right cerebellar hemispheres corresponding to the acute infarcts demonstrated on yesterday's MRI. There is no significant posterior fossa mass effect. Cerebral atrophy is unchanged. There is no evidence of acute intracranial hemorrhage, mass, midline shift, or extra-axial fluid collection. Calvarium and skull base: No skull fracture or destructive osseous lesion. Paranasal sinuses: Mild bilateral ethmoid air cell mucosal thickening. Orbits: Unremarkable. CTA NECK Aortic arch: 3 vessel aortic arch. Brachiocephalic and subclavian arteries are widely patent. Right carotid system: Patent with minimal non stenotic plaque at the carotid bifurcation. Left carotid system: Patent with mild non stenotic calcified plaque at the carotid bifurcation. Vertebral arteries: Patent with the left being minimally larger than the right. No significant stenosis identified, although the vertebral artery origins are partially obscured bilaterally by streak artifact. Skeleton: Mild diffuse cervical disc degeneration. Other neck: Mild motion artifact and minimal dependent atelectasis in the visualized lungs. 4 mm right upper lobe lung nodule (series 6, image 3). CTA HEAD Anterior circulation: The internal carotid arteries are patent from skullbase to carotid termini without stenosis. ACAs and MCAs are patent without evidence of significant stenosis. Mild distal left M1 stenosis on MRA is not confirmed. No intracranial aneurysm is identified. Posterior circulation: The intracranial vertebral arteries are widely patent and both contribute to the basilar. AICA and SCA origins are patent. Basilar artery is patent without stenosis. A pair proximal basilar artery stenosis on MRA was likely artifactual. There is a small right posterior communicating artery. PCAs are patent with a severe proximal P2 stenosis on the left. No significant right PCA stenosis. Venous sinuses: Patent. Anatomic variants: None.  Delayed phase: No abnormal enhancement. IMPRESSION: 1. No major intracranial arterial occlusion. Widely patent basilar artery, with appearance on  earlier MRA being artifactual. 2. Severe proximal left P2 PCA stenosis. No significant anterior circulation stenosis. 3. No carotid or vertebral artery stenosis in the neck. 4. 4 mm right upper lobe lung nodule. If the patient is at high risk for bronchogenic carcinoma, follow-up chest CT at 1 year is recommended. If the patient is at low risk, no follow-up is needed. This recommendation follows the consensus statement: Guidelines for Management of Small Pulmonary Nodules Detected on CT Scans: A Statement from the Fleischner Society as published in Radiology 2005; 237:395-400. Electronically Signed   By: Sebastian Ache M.D.   On: 07/25/2015 15:41   Ct Head Wo Contrast  07/23/2015  CLINICAL DATA:  Nausea and vomiting today. Episode of left arm weakness last night. Altered mental status last night. EXAM: CT HEAD WITHOUT CONTRAST TECHNIQUE: Contiguous axial images were obtained from the base of the skull through the vertex without intravenous contrast. COMPARISON:  None. FINDINGS: Diffusely enlarged ventricles and subarachnoid spaces. No intracranial hemorrhage, mass lesion or CT evidence of acute infarction. Bilateral ethmoid sinus mucosal thickening. Unremarkable bones. IMPRESSION: 1. No acute abnormality. 2. Moderate diffuse cerebral atrophy and mild diffuse cerebellar atrophy. 3. Mild chronic bilateral ethmoid sinusitis. Electronically Signed   By: Beckie Salts M.D.   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 EXAM: CT ANGIOGRAPHY HEAD AND NECK TECHNIQUE: Multidetector CT imaging of the head and neck was performed using the standard protocol during bolus administration of intravenous contrast. Multiplanar CT image reconstructions and MIPs were obtained to  evaluate the vascular anatomy. Carotid stenosis measurements (when applicable) are obtained utilizing NASCET criteria, using the distal internal carotid diameter as the denominator. CONTRAST:  80mL OMNIPAQUE IOHEXOL 350 MG/ML SOLN COMPARISON:  Head MRI and MRA 07/24/2015 FINDINGS: CT HEAD Brain: Cytotoxic edema is noted in the left greater than right cerebellar hemispheres corresponding to the acute infarcts demonstrated on yesterday's MRI. There is no significant posterior fossa mass effect. Cerebral atrophy is unchanged. There is no evidence of acute intracranial hemorrhage, mass, midline shift, or extra-axial fluid collection. Calvarium and skull base: No skull fracture or destructive osseous lesion. Paranasal sinuses: Mild bilateral ethmoid air cell mucosal thickening. Orbits: Unremarkable. CTA NECK Aortic arch: 3 vessel aortic arch. Brachiocephalic and subclavian arteries are widely patent. Right carotid system: Patent with minimal non stenotic plaque at the carotid bifurcation. Left carotid system: Patent with mild non stenotic calcified plaque at the carotid bifurcation. Vertebral arteries: Patent with the left being minimally larger than the right. No significant stenosis identified, although the vertebral artery origins are partially obscured bilaterally by streak artifact. Skeleton: Mild diffuse cervical disc degeneration. Other neck: Mild motion artifact and minimal dependent atelectasis in the visualized lungs. 4 mm right upper lobe lung nodule (series 6, image 3). CTA HEAD Anterior circulation: The internal carotid arteries are patent from skullbase to carotid termini without stenosis. ACAs and MCAs are patent without evidence of significant stenosis. Mild distal left M1 stenosis on MRA is not confirmed. No intracranial aneurysm is identified. Posterior circulation: The intracranial vertebral arteries are widely patent and both contribute to the basilar. AICA and SCA origins are patent. Basilar artery  is patent without stenosis. A pair proximal basilar artery stenosis on MRA was likely artifactual. There is a small right posterior communicating artery. PCAs are patent with a severe proximal P2 stenosis on the left. No significant right PCA stenosis. Venous sinuses:  Patent. Anatomic variants: None. Delayed phase: No abnormal enhancement. IMPRESSION: 1. No major intracranial arterial occlusion. Widely patent basilar artery, with appearance on earlier MRA being artifactual. 2. Severe proximal left P2 PCA stenosis. No significant anterior circulation stenosis. 3. No carotid or vertebral artery stenosis in the neck. 4. 4 mm right upper lobe lung nodule. If the patient is at high risk for bronchogenic carcinoma, follow-up chest CT at 1 year is recommended. If the patient is at low risk, no follow-up is needed. This recommendation follows the consensus statement: Guidelines for Management of Small Pulmonary Nodules Detected on CT Scans: A Statement from the Fleischner Society as published in Radiology 2005; 237:395-400. Electronically Signed   By: Sebastian Ache M.D.   On: 07/25/2015 15:41   Mr Maxine Glenn Head Wo Contrast  07/24/2015  CLINICAL DATA:  BILATERAL cerebellar infarcts. Continued surveillance. EXAM: MRA HEAD WITHOUT CONTRAST TECHNIQUE: Angiographic images of the Circle of Willis were obtained using MRA technique without intravenous contrast. COMPARISON:  MRI brain 07/24/2015.  CT head 07/23/2015. FINDINGS: The internal carotid arteries are dolichoectatic but widely patent throughout their cervical, petrous, cavernous and supraclinoid segments. Signal loss in the vertical petrous segment of the LEFT ICA appears artifactual on axial source images. There is focal narrowing, estimated 50-75% stenosis, of the basilar artery in its proximal aspect, extending over a length of approximately 5 mm from just above the vertebral confluence to just below the anterior inferior cerebellar artery origins. There is diminished  caliber also of the distal V4 segment of the RIGHT vertebral, estimated 50-75% stenosis. Intracranial atherosclerosis is suspected. The RIGHT M1 MCA is widely patent. The LEFT M1 MCA shows focal narrowing in its distal M1 segment, suspected 50% stenosis. Poor flow related enhancement in the BILATERAL M2 and M3 MCA segments is probably technical in nature rather than representing intracranial atherosclerosis, based on review of the axial source images. Both anterior cerebral arteries are patent but mildly irregular, more so on the RIGHT. No flow-limiting stenosis is evident. Both posterior cerebral arteries originate from the basilar tip. Minor non stenotic irregularity is observed on the RIGHT. Focal high-grade tandem stenoses of the LEFT P2 PCA segment estimated to be 75-90%, more severe distally. Both superior cerebellar artery origins appear widely patent. Both vessels are observed for at least 2 cm from the origin and do not appear significantly diseased. The RIGHT and LEFT anterior inferior cerebellar arteries both appear patent based on axial source images but are poorly seen on MIP images. BILATERAL PICA vessels are poorly visualized. No intracranial aneurysm. 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 Intracranial atherosclerosis is suspected. Other areas of intracranial atherosclerotic narrowing include LEFT M1 MCA (50%), and LEFT P2 PCA segment (75-90% tandem lesions). No proximal flow reducing lesion of the LEFT or RIGHT superior cerebellar arteries is observed. Electronically Signed   By: Elsie Stain M.D.   On: 07/24/2015 16:09   Mr Brain Wo Contrast  07/24/2015  CLINICAL DATA:  Possible head injury 5 days ago. History of hypertension. Hand clumsiness for 2 days. Headache. EXAM: MRI HEAD WITHOUT CONTRAST TECHNIQUE: Multiplanar, multiecho pulse sequences of the brain and surrounding structures were obtained without intravenous contrast.  COMPARISON:  CT head 07/23/2015. FINDINGS: Focal and confluent areas of restricted diffusion affect the LEFT greater than RIGHT cerebellum. The largest area of confluent abnormal signal involves the LEFT superior cerebellar hemisphere, LEFT SCA distribution. Similar lesser involvement RIGHT SCA territory, with a wedge-shaped cortical infarct,  more laterally. Cerebellar deep white matter involvement on the LEFT involves a portion of the dentate nucleus. No acute infarction of the vermis, or inferior cerebellum. No brainstem or supratentorial involvement. No hemorrhagic transformation. Generalized atrophy with hydrocephalus ex vacuo. Minor white matter disease, likely chronic microvascular ischemic change. No chronic hemorrhage. Flow voids are maintained in the carotid, basilar, and both vertebral arteries. No midline abnormality. Minor chronic sinus disease. Minor RIGHT mastoid effusion. Negative orbits. Extracranial soft tissues unremarkable. Compared with yesterday's CT, the areas of infarction are not clearly visible. IMPRESSION: Focal and confluent areas of acute nonhemorrhagic infarction affecting the BILATERAL cerebellar hemispheres, with greatest involvement in the LEFT superior cerebellar artery territory. No brainstem involvement. Given the different vascular territories involved, shower of emboli is suspected. No posttraumatic sequelae are evident. No appreciable stenosis or occlusion of the BILATERAL distal vertebral arteries and basilar artery. If further investigation desired, consider extracranial and intracranial MRA for further evaluation. Electronically Signed   By: Elsie Stain M.D.   On: 07/24/2015 13:06       Today   Subjective:   Maurice Moses  feels okay denies any complaints  Objective:   Blood pressure 151/82, pulse 64, temperature 97.9 F (36.6 C), temperature source Oral, resp. rate 20, height  (1.727 m), weight 104.826 kg (231 lb 1.6 oz), SpO2 94 %.  .  Intake/Output  Summary (Last 24 hours) at 07/27/15 0856 Last data filed at 07/27/15 1610  Gross per 24 hour  Intake      0 ml  Output      0 ml  Net      0 ml    Exam VITAL SIGNS: Blood pressure 151/82, pulse 64, temperature 97.9 F (36.6 C), temperature source Oral, resp. rate 20, height  (1.727 m), weight 104.826 kg (231 lb 1.6 oz), SpO2 94 %.  GENERAL:  69 y.o.-year-old patient lying in the bed with no acute distress.  EYES: Pupils equal, round, reactive to light and accommodation. No scleral icterus. Extraocular muscles intact.  HEENT: Head atraumatic, normocephalic. Oropharynx and nasopharynx clear.  NECK:  Supple, no jugular venous distention. No thyroid enlargement, no tenderness.  LUNGS: Normal breath sounds bilaterally, no wheezing, rales,rhonchi or crepitation. No use of accessory muscles of respiration.  CARDIOVASCULAR: S1, S2 normal. No murmurs, rubs, or gallops.  ABDOMEN: Soft, nontender, nondistended. Bowel sounds present. No organomegaly or mass.  EXTREMITIES: No pedal edema, cyanosis, or clubbing.  NEUROLOGIC: Cranial nerves II through XII are intact. Muscle strength 5/5 in all extremities. Sensation intact. Gait not checked.  PSYCHIATRIC: The patient is alert and oriented x 3.  SKIN: No obvious rash, lesion, or ulcer.   Data Review     CBC w Diff:  Lab Results  Component Value Date   WBC 9.9 07/23/2015   HGB 14.8 07/23/2015   HCT 42.8 07/23/2015   PLT 263 07/23/2015   LYMPHOPCT 10 07/23/2015   MONOPCT 5 07/23/2015   EOSPCT 1 07/23/2015   BASOPCT 1 07/23/2015   CMP:  Lab Results  Component Value Date   NA 139 07/26/2015   K 3.8 07/26/2015   CL 106 07/26/2015   CO2 27 07/26/2015   BUN 15 07/26/2015   CREATININE 1.34* 07/26/2015   PROT 6.1* 07/26/2015   ALBUMIN 3.3* 07/26/2015   BILITOT 0.7 07/26/2015   ALKPHOS 72 07/26/2015   AST 18 07/26/2015   ALT 20 07/26/2015  .  Micro Results No results found for this or any previous visit (from the  past 240  hour(s)).      Code Status Orders        Start     Ordered   07/23/15 2046  Full code   Continuous     07/23/15 2045    Advance Directive Documentation        Most Recent Value   Type of Advance Directive  Healthcare Power of Attorney   Pre-existing out of facility DNR order (yellow form or pink MOST form)     "MOST" Form in Place?                Follow-up Information    Follow up with pcp In 7 days.      Discharge Medications     Medication List    TAKE these medications        aspirin 81 MG EC tablet  Take 1 tablet (81 mg total) by mouth daily.     atorvastatin 40 MG tablet  Commonly known as:  LIPITOR  Take 1 tablet (40 mg total) by mouth daily at 6 PM.     carbidopa-levodopa 25-100 MG tablet  Commonly known as:  SINEMET IR  Take 1 tablet by mouth 3 (three) times daily.     cetirizine 10 MG tablet  Commonly known as:  ZYRTEC  Take 10 mg by mouth daily.     cholecalciferol 1000 UNITS tablet  Commonly known as:  VITAMIN D  Take 2,000 Units by mouth daily.     clopidogrel 75 MG tablet  Commonly known as:  PLAVIX  Take 1 tablet (75 mg total) by mouth daily.     clorazepate 7.5 MG tablet  Commonly known as:  TRANXENE  Take 1 tablet by mouth daily.     docusate sodium 50 MG capsule  Commonly known as:  COLACE  Take 50 mg by mouth 2 (two) times daily.     escitalopram 20 MG tablet  Commonly known as:  LEXAPRO  Take 20 mg by mouth daily.     esomeprazole 40 MG capsule  Commonly known as:  NEXIUM  Take 40 mg by mouth daily at 12 noon.     hydrochlorothiazide 25 MG tablet  Commonly known as:  HYDRODIURIL  Take 25 mg by mouth daily.     HYDROcodone-acetaminophen 5-325 MG tablet  Commonly known as:  NORCO/VICODIN  Take 1-2 tablets by mouth every 4 (four) hours as needed for moderate pain (Max 7 per day).     mirtazapine 30 MG disintegrating tablet  Commonly known as:  REMERON SOL-TAB  Take 1 tablet by mouth daily.     quinapril 40 MG  tablet  Commonly known as:  ACCUPRIL  Take 40 mg by mouth at bedtime.           Total Time in preparing paper work, data evaluation and todays exam - 35 minutes  Auburn BilberryPATEL, Ala Capri M.D on 07/27/2015 at 8:56 AM  [facility]   Office  [phone]

## 2015-07-26 NOTE — Progress Notes (Signed)
PT Cancellation Note  Patient Details Name: Royanne FootsClaude Moure MRN: [id number] DOB: 03/20/1946   Cancelled Treatment:    Reason Eval/Treat Not Completed: Patient declined, no reason specified. Treatment attempted late morning. Pt refused any and all PT. Pt notes he feels he will just wait until he goes to rehab and states "in light of the circumstances, I would prefer not to do any therapy". Refuses out of bed. Lengthy discussion with pt/family on importance of PT and getting out of bed during hospital stay and not waiting until pt goes to rehab. Family does NOT encourage pt. Pt pleasantly, but adamantly refuses. Spoke with neurologist on treatment team regarding discussion with pt. MD notes that pt has been "difficult" with some staff and offers suggestion that pt may respond better to a male therapist. Will attempt to schedule pt with a male therapist tomorrow for treatment.    Elsie StainHeidi Elizabeth Bishop 07/26/2015, 2:26 PM

## 2015-07-26 NOTE — Plan of Care (Signed)
Problem: Health Behavior/Discharge Planning: Goal: Ability to manage health-related needs will improve Outcome: Progressing pts NIH is 4 this shift.  Pt seems more rested today and less anxious.  Only one report of a headache this shift. BP continues to be elevated.

## 2015-07-26 NOTE — Consult Note (Signed)
Reason for Consult: possible source of thrombus emboli , CVA Referring Physician:  Dr. Tamala Julian Neurology,  Dr. Lavetta Nielsen hospitalist  Maurice Moses is an 69 y.o. male.  HPI:  69 year old white male history of hypertension what appears to be Parkinson's disease on Sinemet had episode of weakness clumsiness headaches and aching. Patient now has right arm weakness. Patient has a previous history of smoking no prior neurological events denies any chest pain shortness of breath palpitations or tachycardia. Patient is had vertigo and was placed on aspirin and Plavix for further treatment. The patient now needs  Transesophageal echocardiogram for evaluation of possible thrombus or source of emboli.  Past Medical History  Diagnosis Date  . Hypertension   . Depression   . IBS (irritable bowel syndrome)     History reviewed. No pertinent past surgical history.  Family History  Problem Relation Age of Onset  . Hypertension Other     Social History:  reports that he has quit smoking. He does not have any smokeless tobacco history on file. He reports that he does not drink alcohol. His drug history is not on file.  Allergies: No Known Allergies  Medications: I have reviewed the patient's current medications.  Results for orders placed or performed during the hospital encounter of 07/23/15 (from the past 48 hour(s))  Basic metabolic panel     Status: Abnormal   Collection Time: 07/25/15  9:23 AM  Result Value Ref Range   Sodium 140 135 - 145 mmol/L   Potassium 3.7 3.5 - 5.1 mmol/L   Chloride 106 101 - 111 mmol/L   CO2 28 22 - 32 mmol/L   Glucose, Bld 112 (H) 65 - 99 mg/dL   BUN 15 6 - 20 mg/dL   Creatinine, Ser 1.35 (H) 0.61 - 1.24 mg/dL   Calcium 9.4 8.9 - 10.3 mg/dL   GFR calc non Af Amer 51 (L) >60 mL/min   GFR calc Af Amer 59 (L) >60 mL/min    Comment: (NOTE) The eGFR has been calculated using the CKD EPI equation. This calculation has not been validated in all clinical situations. eGFR's  persistently <60 mL/min signify possible Chronic Kidney Disease.    Anion gap 6 5 - 15  Vitamin B12     Status: None   Collection Time: 07/25/15  9:23 AM  Result Value Ref Range   Vitamin B-12 390 180 - 914 pg/mL    Comment: (NOTE) This assay is not validated for testing neonatal or myeloproliferative syndrome specimens for Vitamin B12 levels. Performed at [facility] metabolic panel     Status: Abnormal   Collection Time: 07/26/15  9:17 AM  Result Value Ref Range   Sodium 139 135 - 145 mmol/L   Potassium 3.8 3.5 - 5.1 mmol/L   Chloride 106 101 - 111 mmol/L   CO2 27 22 - 32 mmol/L   Glucose, Bld 100 (H) 65 - 99 mg/dL   BUN 15 6 - 20 mg/dL   Creatinine, Ser 1.34 (H) 0.61 - 1.24 mg/dL   Calcium 8.9 8.9 - 10.3 mg/dL   GFR calc non Af Amer 52 (L) >60 mL/min   GFR calc Af Amer 60 (L) >60 mL/min    Comment: (NOTE) The eGFR has been calculated using the CKD EPI equation. This calculation has not been validated in all clinical situations. eGFR's persistently <60 mL/min signify possible Chronic Kidney Disease.    Anion gap 6 5 - 15  CK  Status: None   Collection Time: 07/26/15  9:17 AM  Result Value Ref Range   Total CK 63 49 - 397 U/L  Sedimentation rate     Status: None   Collection Time: 07/26/15  9:17 AM  Result Value Ref Range   Sed Rate 17 0 - 20 mm/hr  TSH     Status: None   Collection Time: 07/26/15  9:17 AM  Result Value Ref Range   TSH 1.602 0.350 - 4.500 uIU/mL  Hepatic function panel     Status: Abnormal   Collection Time: 07/26/15  9:17 AM  Result Value Ref Range   Total Protein 6.1 (L) 6.5 - 8.1 g/dL   Albumin 3.3 (L) 3.5 - 5.0 g/dL   AST 18 15 - 41 U/L   ALT 20 17 - 63 U/L   Alkaline Phosphatase 72 38 - 126 U/L   Total Bilirubin 0.7 0.3 - 1.2 mg/dL   Bilirubin, Direct 0.1 0.1 - 0.5 mg/dL   Indirect Bilirubin 0.6 0.3 - 0.9 mg/dL    Ct Angio Head W/cm &/or Wo Cm  07/25/2015  CLINICAL DATA:  Acute bilateral cerebellar infarcts. 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 EXAM: CT ANGIOGRAPHY HEAD AND NECK TECHNIQUE: Multidetector CT imaging of the head and neck was performed using the standard protocol during bolus administration of intravenous contrast. Multiplanar CT image reconstructions and MIPs were obtained to evaluate the vascular anatomy. Carotid stenosis measurements (when applicable) are obtained utilizing NASCET criteria, using the distal internal carotid diameter as the denominator. CONTRAST:  67m OMNIPAQUE IOHEXOL 350 MG/ML SOLN COMPARISON:  Head MRI and MRA 07/24/2015 FINDINGS: CT HEAD Brain: Cytotoxic edema is noted in the left greater than right cerebellar hemispheres corresponding to the acute infarcts demonstrated on yesterday's MRI. There is no significant posterior fossa mass effect. Cerebral atrophy is unchanged. There is no evidence of acute intracranial hemorrhage, mass, midline shift, or extra-axial fluid collection. Calvarium and skull base: No skull fracture or destructive osseous lesion. Paranasal sinuses: Mild bilateral ethmoid air cell mucosal thickening. Orbits: Unremarkable. CTA NECK Aortic arch: 3 vessel aortic arch. Brachiocephalic and subclavian arteries are widely patent. Right carotid system: Patent with minimal non stenotic plaque at the carotid bifurcation. Left carotid system: Patent with mild non stenotic calcified plaque at the carotid bifurcation. Vertebral arteries: Patent with the left being minimally larger than the right. No significant stenosis identified, although the vertebral artery origins are partially obscured bilaterally by streak artifact. Skeleton: Mild diffuse cervical disc degeneration. Other neck: Mild motion artifact and minimal dependent atelectasis in the visualized lungs. 4 mm right upper lobe lung nodule (series 6, image 3). CTA HEAD Anterior circulation: The internal carotid arteries are patent from skullbase to carotid termini without stenosis. ACAs and MCAs are patent without evidence of  significant stenosis. Mild distal left M1 stenosis on MRA is not confirmed. No intracranial aneurysm is identified. Posterior circulation: The intracranial vertebral arteries are widely patent and both contribute to the basilar. AICA and SCA origins are patent. Basilar artery is patent without stenosis. A pair proximal basilar artery stenosis on MRA was likely artifactual. There is a small right posterior communicating artery. PCAs are patent with a severe proximal P2 stenosis on the left. No significant right PCA stenosis. Venous sinuses: Patent. Anatomic variants: None. Delayed phase: No abnormal enhancement. IMPRESSION: 1. No major intracranial arterial occlusion. Widely patent basilar artery, with appearance on earlier MRA being artifactual. 2. Severe proximal left P2 PCA stenosis. No significant anterior circulation stenosis. 3.  No carotid or vertebral artery stenosis in the neck. 4. 4 mm right upper lobe lung nodule. If the patient is at high risk for bronchogenic carcinoma, follow-up chest CT at 1 year is recommended. If the patient is at low risk, no follow-up is needed. This recommendation follows the consensus statement: Guidelines for Management of Small Pulmonary Nodules Detected on CT Scans: A Statement from the [place] as published in Radiology 2005; 237:395-400. Electronically Signed   By: Logan Bores M.D.   On: 07/25/2015 15:41   Ct Angio Neck W/cm &/or Wo/cm  07/25/2015  CLINICAL DATA:  Acute bilateral cerebellar infarcts. 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 EXAM: CT ANGIOGRAPHY HEAD AND NECK TECHNIQUE: Multidetector CT imaging of the head and neck was performed using the standard protocol during bolus administration of intravenous contrast. Multiplanar CT image reconstructions and MIPs were obtained to evaluate the vascular anatomy. Carotid stenosis measurements (when applicable) are obtained utilizing NASCET criteria, using the distal internal carotid diameter as the denominator.  CONTRAST:  24m OMNIPAQUE IOHEXOL 350 MG/ML SOLN COMPARISON:  Head MRI and MRA 07/24/2015 FINDINGS: CT HEAD Brain: Cytotoxic edema is noted in the left greater than right cerebellar hemispheres corresponding to the acute infarcts demonstrated on yesterday's MRI. There is no significant posterior fossa mass effect. Cerebral atrophy is unchanged. There is no evidence of acute intracranial hemorrhage, mass, midline shift, or extra-axial fluid collection. Calvarium and skull base: No skull fracture or destructive osseous lesion. Paranasal sinuses: Mild bilateral ethmoid air cell mucosal thickening. Orbits: Unremarkable. CTA NECK Aortic arch: 3 vessel aortic arch. Brachiocephalic and subclavian arteries are widely patent. Right carotid system: Patent with minimal non stenotic plaque at the carotid bifurcation. Left carotid system: Patent with mild non stenotic calcified plaque at the carotid bifurcation. Vertebral arteries: Patent with the left being minimally larger than the right. No significant stenosis identified, although the vertebral artery origins are partially obscured bilaterally by streak artifact. Skeleton: Mild diffuse cervical disc degeneration. Other neck: Mild motion artifact and minimal dependent atelectasis in the visualized lungs. 4 mm right upper lobe lung nodule (series 6, image 3). CTA HEAD Anterior circulation: The internal carotid arteries are patent from skullbase to carotid termini without stenosis. ACAs and MCAs are patent without evidence of significant stenosis. Mild distal left M1 stenosis on MRA is not confirmed. No intracranial aneurysm is identified. Posterior circulation: The intracranial vertebral arteries are widely patent and both contribute to the basilar. AICA and SCA origins are patent. Basilar artery is patent without stenosis. A pair proximal basilar artery stenosis on MRA was likely artifactual. There is a small right posterior communicating artery. PCAs are patent with a severe  proximal P2 stenosis on the left. No significant right PCA stenosis. Venous sinuses: Patent. Anatomic variants: None. Delayed phase: No abnormal enhancement. IMPRESSION: 1. No major intracranial arterial occlusion. Widely patent basilar artery, with appearance on earlier MRA being artifactual. 2. Severe proximal left P2 PCA stenosis. No significant anterior circulation stenosis. 3. No carotid or vertebral artery stenosis in the neck. 4. 4 mm right upper lobe lung nodule. If the patient is at high risk for bronchogenic carcinoma, follow-up chest CT at 1 year is recommended. If the patient is at low risk, no follow-up is needed. This recommendation follows the consensus statement: Guidelines for Management of Small Pulmonary Nodules Detected on CT Scans: A Statement from the [place] published in Radiology 2005; 237:395-400. Electronically Signed   By: ALogan BoresM.D.   On: 07/25/2015 15:41  Mr Jodene Nam Head Wo Contrast  07/24/2015  CLINICAL DATA:  BILATERAL cerebellar infarcts. Continued surveillance. EXAM: MRA HEAD WITHOUT CONTRAST TECHNIQUE: Angiographic images of the Circle of Willis were obtained using MRA technique without intravenous contrast. COMPARISON:  MRI brain 07/24/2015.  CT head 07/23/2015. FINDINGS: The internal carotid arteries are dolichoectatic but widely patent throughout their cervical, petrous, cavernous and supraclinoid segments. Signal loss in the vertical petrous segment of the LEFT ICA appears artifactual on axial source images. There is focal narrowing, estimated 50-75% stenosis, of the basilar artery in its proximal aspect, extending over a length of approximately 5 mm from just above the vertebral confluence to just below the anterior inferior cerebellar artery origins. There is diminished caliber also of the distal V4 segment of the RIGHT vertebral, estimated 50-75% stenosis. Intracranial atherosclerosis is suspected. The RIGHT M1 MCA is widely patent. The LEFT M1 MCA shows  focal narrowing in its distal M1 segment, suspected 50% stenosis. Poor flow related enhancement in the BILATERAL M2 and M3 MCA segments is probably technical in nature rather than representing intracranial atherosclerosis, based on review of the axial source images. Both anterior cerebral arteries are patent but mildly irregular, more so on the RIGHT. No flow-limiting stenosis is evident. Both posterior cerebral arteries originate from the basilar tip. Minor non stenotic irregularity is observed on the RIGHT. Focal high-grade tandem stenoses of the LEFT P2 PCA segment estimated to be 75-90%, more severe distally. Both superior cerebellar artery origins appear widely patent. Both vessels are observed for at least 2 cm from the origin and do not appear significantly diseased. The RIGHT and LEFT anterior inferior cerebellar arteries both appear patent based on axial source images but are poorly seen on MIP images. BILATERAL PICA vessels are poorly visualized. No intracranial aneurysm. 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 Intracranial atherosclerosis is suspected. Other areas of intracranial atherosclerotic narrowing include LEFT M1 MCA (50%), and LEFT P2 PCA segment (75-90% tandem lesions). No proximal flow reducing lesion of the LEFT or RIGHT superior cerebellar arteries is observed. Electronically Signed   By: Staci Righter M.D.   On: 07/24/2015 16:09    Review of Systems  Constitutional: Positive for malaise/fatigue.  HENT: Negative.   Eyes: Negative.   Cardiovascular: Negative.   Gastrointestinal: Negative.   Genitourinary: Negative.   Musculoskeletal: Negative.   Skin: Negative.   Neurological: Positive for weakness.  Endo/Heme/Allergies: Negative.   Psychiatric/Behavioral: Negative.    Blood pressure 150/79, pulse 84, temperature 98.1 F (36.7 C), temperature source Oral, resp. rate 18, height 5' 8"  (1.727 m), weight 104.826 kg  (231 lb 1.6 oz), SpO2 97 %. Physical Exam  Constitutional: He is oriented to person, place, and time. He appears well-developed and well-nourished.  HENT:  Head: Normocephalic and atraumatic.  Eyes: Conjunctivae and EOM are normal. Pupils are equal, round, and reactive to light.  Neck: Normal range of motion. Neck supple.  Cardiovascular: Normal rate, regular rhythm and normal heart sounds.   Respiratory: Effort normal and breath sounds normal.  GI: Soft. Bowel sounds are normal.  Musculoskeletal: Normal range of motion.  Neurological: He is alert and oriented to person, place, and time. He has normal reflexes. He displays a negative Romberg sign. Coordination abnormal.   Mild right arm weakness  mildly slurred speech  Skin: Skin is warm and dry.  Psychiatric: He has a normal mood and affect.    Assessment/Plan:  CVA/TIA  right arm weakness  hyperlipidemia  Parkinson's  hypertension GERD  mild obesity  sinus trouble  vertigo . PLAN  anticoagulation with dual platelets therapy  continue aspirin and Plavix  Lipitor for lipid management  continue meclizine for Antivert  recommend weight loss exercise portion control  agree with physical therapy, speech therapy  continue Sinemet for Parkinson's  recommend loop monitor for evaluation of possible arrhythmia  agree with TEE for evaluation of thrombus  continue Protonix for reflux symptoms  Margarethe Virgen D. 07/26/2015, 3:06 PM

## 2015-07-26 NOTE — Discharge Instructions (Signed)
°  DIET:  Cardiac diet  DISCHARGE CONDITION:  Stable  ACTIVITY:  Activity as tolerated  With assitance, pt eval and treat  OXYGEN:  Home Oxygen: No.   Oxygen Delivery: room air  DISCHARGE LOCATION:  home    ADDITIONAL DISCHARGE INSTRUCTION:   If you experience worsening of your admission symptoms, develop shortness of breath, life threatening emergency, suicidal or homicidal thoughts you must seek medical attention immediately by calling 911 or calling your MD immediately  if symptoms less severe.  You Must read complete instructions/literature along with all the possible adverse reactions/side effects for all the Medicines you take and that have been prescribed to you. Take any new Medicines after you have completely understood and accpet all the possible adverse reactions/side effects.   Please note  You were cared for by a hospitalist during your hospital stay. If you have any questions about your discharge medications or the care you received while you were in the hospital after you are discharged, you can call the unit and asked to speak with the hospitalist on call if the hospitalist that took care of you is not available. Once you are discharged, your primary care physician will handle any further medical issues. Please note that NO REFILLS for any discharge medications will be authorized once you are discharged, as it is imperative that you return to your primary care physician (or establish a relationship with a primary care physician if you do not have one) for your aftercare needs so that they can reassess your need for medications and monitor your lab values.

## 2015-07-26 NOTE — Consult Note (Signed)
Reason for Consult: stroke Referring Physician: Dr. Cammy Brochure is an 69 y.o. male.  HPI: 69 yo RDH M presents to [facility] due to clumbiness in his L hand.  MRI showed a stroke so Neurology was consulted.  Pt was out of the window for tPA.  Wife is at bedside and stresses how much pt cant move on his own prior and that he has had problems walking for years.  They deny tremor but do state that he has a stupped gait with falling backwards.  His mother had somewhat the same thing.  Past Medical History  Diagnosis Date  . Hypertension   . Depression   . IBS (irritable bowel syndrome)     History reviewed. No pertinent past surgical history.  Family History  Problem Relation Age of Onset  . Hypertension Other   FHx:  Mother and grandmother with some movement disorder   Social History:  reports that he has quit smoking. He does not have any smokeless tobacco history on file. He reports that he does not drink alcohol. His drug history is not on file.  Allergies: No Known Allergies  Medications: personally reviewed by me as per chart  Results for orders placed or performed during the hospital encounter of 07/23/15 (from the past 48 hour(s))  Basic metabolic panel     Status: Abnormal   Collection Time: 07/25/15  9:23 AM  Result Value Ref Range   Sodium 140 135 - 145 mmol/L   Potassium 3.7 3.5 - 5.1 mmol/L   Chloride 106 101 - 111 mmol/L   CO2 28 22 - 32 mmol/L   Glucose, Bld 112 (H) 65 - 99 mg/dL   BUN 15 6 - 20 mg/dL   Creatinine, Ser 1.35 (H) 0.61 - 1.24 mg/dL   Calcium 9.4 8.9 - 10.3 mg/dL   GFR calc non Af Amer 51 (L) >60 mL/min   GFR calc Af Amer 59 (L) >60 mL/min    Comment: (NOTE) The eGFR has been calculated using the CKD EPI equation. This calculation has not been validated in all clinical situations. eGFR's persistently <60 mL/min signify possible Chronic Kidney Disease.    Anion gap 6 5 - 15  Vitamin B12     Status: None   Collection Time: 07/25/15  9:23 AM   Result Value Ref Range   Vitamin B-12 390 180 - 914 pg/mL    Comment: (NOTE) This assay is not validated for testing neonatal or myeloproliferative syndrome specimens for Vitamin B12 levels. Performed at [facility] metabolic panel     Status: Abnormal   Collection Time: 07/26/15  9:17 AM  Result Value Ref Range   Sodium 139 135 - 145 mmol/L   Potassium 3.8 3.5 - 5.1 mmol/L   Chloride 106 101 - 111 mmol/L   CO2 27 22 - 32 mmol/L   Glucose, Bld 100 (H) 65 - 99 mg/dL   BUN 15 6 - 20 mg/dL   Creatinine, Ser 1.34 (H) 0.61 - 1.24 mg/dL   Calcium 8.9 8.9 - 10.3 mg/dL   GFR calc non Af Amer 52 (L) >60 mL/min   GFR calc Af Amer 60 (L) >60 mL/min    Comment: (NOTE) The eGFR has been calculated using the CKD EPI equation. This calculation has not been validated in all clinical situations. eGFR's persistently <60 mL/min signify possible Chronic Kidney Disease.    Anion gap 6 5 - 15    Ct Angio Head W/cm &/or  Wo Cm  07/25/2015  CLINICAL DATA:  Acute bilateral cerebellar infarcts. 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 EXAM: CT ANGIOGRAPHY HEAD AND NECK TECHNIQUE: Multidetector CT imaging of the head and neck was performed using the standard protocol during bolus administration of intravenous contrast. Multiplanar CT image reconstructions and MIPs were obtained to evaluate the vascular anatomy. Carotid stenosis measurements (when applicable) are obtained utilizing NASCET criteria, using the distal internal carotid diameter as the denominator. CONTRAST:  78m OMNIPAQUE IOHEXOL 350 MG/ML SOLN COMPARISON:  Head MRI and MRA 07/24/2015 FINDINGS: CT HEAD Brain: Cytotoxic edema is noted in the left greater than right cerebellar hemispheres corresponding to the acute infarcts demonstrated on yesterday's MRI. There is no significant posterior fossa mass effect. Cerebral atrophy is unchanged. There is no evidence of acute intracranial hemorrhage, mass, midline shift, or extra-axial fluid  collection. Calvarium and skull base: No skull fracture or destructive osseous lesion. Paranasal sinuses: Mild bilateral ethmoid air cell mucosal thickening. Orbits: Unremarkable. CTA NECK Aortic arch: 3 vessel aortic arch. Brachiocephalic and subclavian arteries are widely patent. Right carotid system: Patent with minimal non stenotic plaque at the carotid bifurcation. Left carotid system: Patent with mild non stenotic calcified plaque at the carotid bifurcation. Vertebral arteries: Patent with the left being minimally larger than the right. No significant stenosis identified, although the vertebral artery origins are partially obscured bilaterally by streak artifact. Skeleton: Mild diffuse cervical disc degeneration. Other neck: Mild motion artifact and minimal dependent atelectasis in the visualized lungs. 4 mm right upper lobe lung nodule (series 6, image 3). CTA HEAD Anterior circulation: The internal carotid arteries are patent from skullbase to carotid termini without stenosis. ACAs and MCAs are patent without evidence of significant stenosis. Mild distal left M1 stenosis on MRA is not confirmed. No intracranial aneurysm is identified. Posterior circulation: The intracranial vertebral arteries are widely patent and both contribute to the basilar. AICA and SCA origins are patent. Basilar artery is patent without stenosis. A pair proximal basilar artery stenosis on MRA was likely artifactual. There is a small right posterior communicating artery. PCAs are patent with a severe proximal P2 stenosis on the left. No significant right PCA stenosis. Venous sinuses: Patent. Anatomic variants: None. Delayed phase: No abnormal enhancement. IMPRESSION: 1. No major intracranial arterial occlusion. Widely patent basilar artery, with appearance on earlier MRA being artifactual. 2. Severe proximal left P2 PCA stenosis. No significant anterior circulation stenosis. 3. No carotid or vertebral artery stenosis in the neck. 4. 4  mm right upper lobe lung nodule. If the patient is at high risk for bronchogenic carcinoma, follow-up chest CT at 1 year is recommended. If the patient is at low risk, no follow-up is needed. This recommendation follows the consensus statement: Guidelines for Management of Small Pulmonary Nodules Detected on CT Scans: A Statement from the [employer] published in Radiology 2005; 237:395-400. Electronically Signed   By: ALogan BoresM.D.   On: 07/25/2015 15:41   Ct Angio Neck W/cm &/or Wo/cm  07/25/2015  CLINICAL DATA:  Acute bilateral cerebellar infarcts. 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 EXAM: CT ANGIOGRAPHY HEAD AND NECK TECHNIQUE: Multidetector CT imaging of the head and neck was performed using the standard protocol during bolus administration of intravenous contrast. Multiplanar CT image reconstructions and MIPs were obtained to evaluate the vascular anatomy. Carotid stenosis measurements (when applicable) are obtained utilizing NASCET criteria, using the distal internal carotid diameter as the denominator. CONTRAST:  850mOMNIPAQUE IOHEXOL 350 MG/ML SOLN COMPARISON:  Head MRI and MRA 07/24/2015  FINDINGS: CT HEAD Brain: Cytotoxic edema is noted in the left greater than right cerebellar hemispheres corresponding to the acute infarcts demonstrated on yesterday's MRI. There is no significant posterior fossa mass effect. Cerebral atrophy is unchanged. There is no evidence of acute intracranial hemorrhage, mass, midline shift, or extra-axial fluid collection. Calvarium and skull base: No skull fracture or destructive osseous lesion. Paranasal sinuses: Mild bilateral ethmoid air cell mucosal thickening. Orbits: Unremarkable. CTA NECK Aortic arch: 3 vessel aortic arch. Brachiocephalic and subclavian arteries are widely patent. Right carotid system: Patent with minimal non stenotic plaque at the carotid bifurcation. Left carotid system: Patent with mild non stenotic calcified plaque at the carotid  bifurcation. Vertebral arteries: Patent with the left being minimally larger than the right. No significant stenosis identified, although the vertebral artery origins are partially obscured bilaterally by streak artifact. Skeleton: Mild diffuse cervical disc degeneration. Other neck: Mild motion artifact and minimal dependent atelectasis in the visualized lungs. 4 mm right upper lobe lung nodule (series 6, image 3). CTA HEAD Anterior circulation: The internal carotid arteries are patent from skullbase to carotid termini without stenosis. ACAs and MCAs are patent without evidence of significant stenosis. Mild distal left M1 stenosis on MRA is not confirmed. No intracranial aneurysm is identified. Posterior circulation: The intracranial vertebral arteries are widely patent and both contribute to the basilar. AICA and SCA origins are patent. Basilar artery is patent without stenosis. A pair proximal basilar artery stenosis on MRA was likely artifactual. There is a small right posterior communicating artery. PCAs are patent with a severe proximal P2 stenosis on the left. No significant right PCA stenosis. Venous sinuses: Patent. Anatomic variants: None. Delayed phase: No abnormal enhancement. IMPRESSION: 1. No major intracranial arterial occlusion. Widely patent basilar artery, with appearance on earlier MRA being artifactual. 2. Severe proximal left P2 PCA stenosis. No significant anterior circulation stenosis. 3. No carotid or vertebral artery stenosis in the neck. 4. 4 mm right upper lobe lung nodule. If the patient is at high risk for bronchogenic carcinoma, follow-up chest CT at 1 year is recommended. If the patient is at low risk, no follow-up is needed. This recommendation follows the consensus statement: Guidelines for Management of Small Pulmonary Nodules Detected on CT Scans: A Statement from the [employer] as published in Radiology 2005; 237:395-400. Electronically Signed   By: Logan Bores M.D.   On:  07/25/2015 15:41   Mr Jodene Nam Head Wo Contrast  07/24/2015  CLINICAL DATA:  BILATERAL cerebellar infarcts. Continued surveillance. EXAM: MRA HEAD WITHOUT CONTRAST TECHNIQUE: Angiographic images of the Circle of Willis were obtained using MRA technique without intravenous contrast. COMPARISON:  MRI brain 07/24/2015.  CT head 07/23/2015. FINDINGS: The internal carotid arteries are dolichoectatic but widely patent throughout their cervical, petrous, cavernous and supraclinoid segments. Signal loss in the vertical petrous segment of the LEFT ICA appears artifactual on axial source images. There is focal narrowing, estimated 50-75% stenosis, of the basilar artery in its proximal aspect, extending over a length of approximately 5 mm from just above the vertebral confluence to just below the anterior inferior cerebellar artery origins. There is diminished caliber also of the distal V4 segment of the RIGHT vertebral, estimated 50-75% stenosis. Intracranial atherosclerosis is suspected. The RIGHT M1 MCA is widely patent. The LEFT M1 MCA shows focal narrowing in its distal M1 segment, suspected 50% stenosis. Poor flow related enhancement in the BILATERAL M2 and M3 MCA segments is probably technical in nature rather than representing intracranial atherosclerosis, based on  review of the axial source images. Both anterior cerebral arteries are patent but mildly irregular, more so on the RIGHT. No flow-limiting stenosis is evident. Both posterior cerebral arteries originate from the basilar tip. Minor non stenotic irregularity is observed on the RIGHT. Focal high-grade tandem stenoses of the LEFT P2 PCA segment estimated to be 75-90%, more severe distally. Both superior cerebellar artery origins appear widely patent. Both vessels are observed for at least 2 cm from the origin and do not appear significantly diseased. The RIGHT and LEFT anterior inferior cerebellar arteries both appear patent based on axial source images but are  poorly seen on MIP images. BILATERAL PICA vessels are poorly visualized. No intracranial aneurysm. 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 Intracranial atherosclerosis is suspected. Other areas of intracranial atherosclerotic narrowing include LEFT M1 MCA (50%), and LEFT P2 PCA segment (75-90% tandem lesions). No proximal flow reducing lesion of the LEFT or RIGHT superior cerebellar arteries is observed. Electronically Signed   By: Staci Righter M.D.   On: 07/24/2015 16:09   Mr Brain Wo Contrast  07/24/2015  CLINICAL DATA:  Possible head injury 5 days ago. History of hypertension. Hand clumsiness for 2 days. Headache. EXAM: MRI HEAD WITHOUT CONTRAST TECHNIQUE: Multiplanar, multiecho pulse sequences of the brain and surrounding structures were obtained without intravenous contrast. COMPARISON:  CT head 07/23/2015. FINDINGS: Focal and confluent areas of restricted diffusion affect the LEFT greater than RIGHT cerebellum. The largest area of confluent abnormal signal involves the LEFT superior cerebellar hemisphere, LEFT SCA distribution. Similar lesser involvement RIGHT SCA territory, with a wedge-shaped cortical infarct, more laterally. Cerebellar deep white matter involvement on the LEFT involves a portion of the dentate nucleus. No acute infarction of the vermis, or inferior cerebellum. No brainstem or supratentorial involvement. No hemorrhagic transformation. Generalized atrophy with hydrocephalus ex vacuo. Minor white matter disease, likely chronic microvascular ischemic change. No chronic hemorrhage. Flow voids are maintained in the carotid, basilar, and both vertebral arteries. No midline abnormality. Minor chronic sinus disease. Minor RIGHT mastoid effusion. Negative orbits. Extracranial soft tissues unremarkable. Compared with yesterday's CT, the areas of infarction are not clearly visible. IMPRESSION: Focal and confluent areas of acute  nonhemorrhagic infarction affecting the BILATERAL cerebellar hemispheres, with greatest involvement in the LEFT superior cerebellar artery territory. No brainstem involvement. Given the different vascular territories involved, shower of emboli is suspected. No posttraumatic sequelae are evident. No appreciable stenosis or occlusion of the BILATERAL distal vertebral arteries and basilar artery. If further investigation desired, consider extracranial and intracranial MRA for further evaluation. Electronically Signed   By: Staci Righter M.D.   On: 07/24/2015 13:06    Review of Systems  Constitutional: Negative.   HENT: Negative.   Eyes: Negative.   Respiratory: Negative.   Cardiovascular: Negative.   Gastrointestinal: Negative.   Genitourinary: Negative.   Musculoskeletal: Negative.   Skin: Negative.   Neurological: Positive for focal weakness. Negative for dizziness, tingling, tremors, sensory change, speech change, seizures and loss of consciousness.  Psychiatric/Behavioral: Negative.    Blood pressure 148/67, pulse 65, temperature 97.6 F (36.4 C), temperature source Oral, resp. rate 18, height 5' 8"  (1.727 m), weight 104.826 kg (231 lb 1.6 oz), SpO2 95 %. Physical Exam  Nursing note and vitals reviewed. Constitutional: He appears well-developed and well-nourished. No distress.  HENT:  Head: Normocephalic and atraumatic.  Right Ear: External ear normal.  Left Ear: External ear normal.  Nose: Nose normal.  Mouth/Throat: Oropharynx  is clear and moist.  Eyes: Conjunctivae and EOM are normal. Pupils are equal, round, and reactive to light. No scleral icterus.  Neck: Normal range of motion. Neck supple.  Cardiovascular: Normal rate, regular rhythm, normal heart sounds and intact distal pulses.   No murmur heard. Respiratory: Effort normal and breath sounds normal. No respiratory distress.  GI: Soft. Bowel sounds are normal. He exhibits no distension.  Musculoskeletal: Normal range of  motion. He exhibits no edema.  Neurological:  A+Ox4, nl speech and language PERRLA, EOMI, nl VF, face symmetric, tongue midline 5-/5 B, slightly increased tone, bradykinesia, no tremor, nl tone dysmmetria on L, truchal ataxia 1+/4 B, down going plantars B Nl sensation B to pin and vibration  Skin: Skin is warm and dry. Rash noted. He is not diaphoretic.  Psychiatric: He has a normal mood and affect.   MRI of brain personally reviewed by me and shows L>R cerebellar infarcts  Assessment/Plan: 1.  Bilateral cerebellar infarcts-  Mildly symptomatic with L dysmmetria;  This is likely cardioembolic and needs further w/u 2.  Parkinsonism-  Family hx of some disorder and pt with severe retropulsion on exam which should not be seen alone with infarct -  Trial of sinemet 25/115m TID -  Cardiology consult with Dr. CWilburn Corneliafor TEE and possible loop -  Continue ASA, plavix and statin until neurology f/u -  Will check Mg ab, TSH, B12, CPK, ESR to see if anything else is contributing to weakness -  Will follow and possible d/c to rehab tomorrow  STamala Julian MHometown12/01/2015, 11:24 AM

## 2015-07-26 NOTE — Progress Notes (Signed)
Patient had no c/o pain this shift [facility], patient had several formed stools today  Wife at bedside  [facility] and Cardiology saw patient today, possible TEE order for tomorrow possible discharge to [facility]

## 2015-07-27 ENCOUNTER — Inpatient Hospital Stay
Admit: 2015-07-27 | Discharge: 2015-07-27 | Disposition: A | Discharge status: Home / Self Care | Payer: Medicare PPO | Attending: Internal Medicine | Admitting: Internal Medicine

## 2015-07-27 ENCOUNTER — Encounter
Admission: EM | Disposition: A | Discharge status: Skilled Nursing Facility | Payer: Self-pay | Source: Home / Self Care | Attending: Internal Medicine

## 2015-07-27 ENCOUNTER — Encounter
Admission: RE | Admit: 2015-07-27 | Discharge: 2015-07-27 | Disposition: A | Discharge status: Home / Self Care | Payer: Medicare PPO | Source: Ambulatory Visit | Attending: Internal Medicine | Admitting: Internal Medicine

## 2015-07-27 SURGERY — ECHOCARDIOGRAM, TRANSESOPHAGEAL
Anesthesia: Moderate Sedation

## 2015-07-27 MED ORDER — FENTANYL CITRATE (PF) 100 MCG/2ML IJ SOLN
INTRAMUSCULAR | Status: AC
  Filled 2015-07-27: qty 2

## 2015-07-27 MED ORDER — SODIUM CHLORIDE 0.9 % IJ SOLN
INTRAMUSCULAR | Status: AC
  Filled 2015-07-27: qty 6

## 2015-07-27 MED ORDER — LORAZEPAM 2 MG/ML IJ SOLN
INTRAMUSCULAR | Status: AC | PRN
  Administered 2015-07-27: 1 mg via INTRAVENOUS

## 2015-07-27 MED ORDER — MIDAZOLAM HCL 2 MG/2ML IJ SOLN
INTRAMUSCULAR | Status: AC | PRN
  Administered 2015-07-27 (×4): 1 mg via INTRAVENOUS

## 2015-07-27 MED ORDER — MIDAZOLAM HCL 5 MG/5ML IJ SOLN
INTRAMUSCULAR | Status: AC
  Filled 2015-07-27: qty 5

## 2015-07-27 MED ORDER — FENTANYL CITRATE (PF) 100 MCG/2ML IJ SOLN
INTRAMUSCULAR | Status: AC | PRN
  Administered 2015-07-27 (×2): 25 ug via INTRAVENOUS
  Administered 2015-07-27: 11:00:00 50 ug via INTRAVENOUS
  Administered 2015-07-27: 25 ug via INTRAVENOUS

## 2015-07-27 MED ORDER — SODIUM CHLORIDE 0.9 % IV SOLN
INTRAVENOUS | Status: DC
  Administered 2015-07-27: 10:00:00 via INTRAVENOUS

## 2015-07-27 MED ORDER — BUTAMBEN-TETRACAINE-BENZOCAINE 2-2-14 % EX AERO
INHALATION_SPRAY | CUTANEOUS | Status: AC
  Filled 2015-07-27: qty 20

## 2015-07-27 MED ORDER — CARBIDOPA-LEVODOPA 25-100 MG PO TABS: 1.0000 | ORAL_TABLET | Freq: Three times a day (TID) | ORAL | Status: DC

## 2015-07-27 MED ORDER — HYDROCHLOROTHIAZIDE 25 MG PO TABS
25.0000 mg | ORAL_TABLET | Freq: Every day | ORAL | Status: DC
  Administered 2015-07-27: 13:00:00 25 mg via ORAL
  Filled 2015-07-27: qty 1

## 2015-07-27 NOTE — Progress Notes (Addendum)
Pt had a period of agitation and demanded to let him leave.  Pt called wife and asked her to come take him home.  Ativan given PO and I sat with pt for 20 minutes until he calmed down.  Pts wife arrived and he seemed to calm down even further and agreed to stay the night.  Wife states that pt takes 7 Vicodin per day at home and wonders if he is having withdrawals.   Gave pt a Vicodin upon request.  Pt resting peacefully.

## 2015-07-27 NOTE — Discharge Summary (Signed)
Maurice Moses, 69 y.o., DOB [birth year], MRN [id number]. Admission date: 07/23/2015 Discharge Date 07/27/2015 Primary MD No primary care provider on file. Admitting Physician Wyatt Haste, MD  Admission Diagnosis  Cerebral infarction due to unspecified mechanism [I63.9]  Discharge Diagnosis   Principal Problem:  Acute CVA Hyperlipidemia Essential hypertension GERD       Hospital Course Maurice Moses is a 69 y.o. male with a known history of essential hypertension presenting with hand clumsiness. One day duration left hand clumsiness with associated headache described as aching, 4/10, non radiating, no worsening/relieving factors. Patient underwent MRI of the brain which showed focal and confluent areas of acute nonhemorrhagic infarction affecting bilateral cerebellar hemisphere with greatest involvement in the left superior cerebral artery territory. Patient underwent echocardiogram of the heart which was negative. He had a MRA of the brain as well as CT angiogram. Which did show focal stenosis.  Patient had a T which was negative for emboli source. He was also seen by neurology and felt to have possible Parkinsonian symptoms he was started on Sinemet, he'll need outpatient neurology follow-up Patient is very weak and deconditioned and needs further physical therapy and rehabilitation which is currently being arranged.            Consults  neurology  Significant Tests:  See full reports for all details    Ct Angio Head W/cm &/or Wo Cm  07/25/2015  CLINICAL DATA:  Acute bilateral cerebellar infarcts. 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 EXAM: CT ANGIOGRAPHY HEAD AND NECK TECHNIQUE: Multidetector CT imaging of the head and neck was performed using the standard protocol during bolus administration of intravenous contrast. Multiplanar CT image reconstructions and MIPs were obtained to evaluate the vascular anatomy. Carotid stenosis measurements (when applicable) are obtained utilizing  NASCET criteria, using the distal internal carotid diameter as the denominator. CONTRAST:  80mL OMNIPAQUE IOHEXOL 350 MG/ML SOLN COMPARISON:  Head MRI and MRA 07/24/2015 FINDINGS: CT HEAD Brain: Cytotoxic edema is noted in the left greater than right cerebellar hemispheres corresponding to the acute infarcts demonstrated on yesterday's MRI. There is no significant posterior fossa mass effect. Cerebral atrophy is unchanged. There is no evidence of acute intracranial hemorrhage, mass, midline shift, or extra-axial fluid collection. Calvarium and skull base: No skull fracture or destructive osseous lesion. Paranasal sinuses: Mild bilateral ethmoid air cell mucosal thickening. Orbits: Unremarkable. CTA NECK Aortic arch: 3 vessel aortic arch. Brachiocephalic and subclavian arteries are widely patent. Right carotid system: Patent with minimal non stenotic plaque at the carotid bifurcation. Left carotid system: Patent with mild non stenotic calcified plaque at the carotid bifurcation. Vertebral arteries: Patent with the left being minimally larger than the right. No significant stenosis identified, although the vertebral artery origins are partially obscured bilaterally by streak artifact. Skeleton: Mild diffuse cervical disc degeneration. Other neck: Mild motion artifact and minimal dependent atelectasis in the visualized lungs. 4 mm right upper lobe lung nodule (series 6, image 3). CTA HEAD Anterior circulation: The internal carotid arteries are patent from skullbase to carotid termini without stenosis. ACAs and MCAs are patent without evidence of significant stenosis. Mild distal left M1 stenosis on MRA is not confirmed. No intracranial aneurysm is identified. Posterior circulation: The intracranial vertebral arteries are widely patent and both contribute to the basilar. AICA and SCA origins are patent. Basilar artery is patent without stenosis. A pair proximal basilar artery stenosis on MRA was likely artifactual.  There is a small right posterior communicating artery. PCAs are patent with a severe proximal P2  stenosis on the left. No significant right PCA stenosis. Venous sinuses: Patent. Anatomic variants: None. Delayed phase: No abnormal enhancement. IMPRESSION: 1. No major intracranial arterial occlusion. Widely patent basilar artery, with appearance on earlier MRA being artifactual. 2. Severe proximal left P2 PCA stenosis. No significant anterior circulation stenosis. 3. No carotid or vertebral artery stenosis in the neck. 4. 4 mm right upper lobe lung nodule. If the patient is at high risk for bronchogenic carcinoma, follow-up chest CT at 1 year is recommended. If the patient is at low risk, no follow-up is needed. This recommendation follows the consensus statement: Guidelines for Management of Small Pulmonary Nodules Detected on CT Scans: A Statement from the Fleischner Society as published in Radiology 2005; 237:395-400. Electronically Signed   By: Sebastian Ache M.D.   On: 07/25/2015 15:41   Ct Head Wo Contrast  07/23/2015  CLINICAL DATA:  Nausea and vomiting today. Episode of left arm weakness last night. Altered mental status last night. EXAM: CT HEAD WITHOUT CONTRAST TECHNIQUE: Contiguous axial images were obtained from the base of the skull through the vertex without intravenous contrast. COMPARISON:  None. FINDINGS: Diffusely enlarged ventricles and subarachnoid spaces. No intracranial hemorrhage, mass lesion or CT evidence of acute infarction. Bilateral ethmoid sinus mucosal thickening. Unremarkable bones. IMPRESSION: 1. No acute abnormality. 2. Moderate diffuse cerebral atrophy and mild diffuse cerebellar atrophy. 3. Mild chronic bilateral ethmoid sinusitis. Electronically Signed   By: Beckie Salts M.D.   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 EXAM: CT ANGIOGRAPHY HEAD AND NECK TECHNIQUE:  Multidetector CT imaging of the head and neck was performed using the standard protocol during bolus administration of intravenous contrast. Multiplanar CT image reconstructions and MIPs were obtained to evaluate the vascular anatomy. Carotid stenosis measurements (when applicable) are obtained utilizing NASCET criteria, using the distal internal carotid diameter as the denominator. CONTRAST:  80mL OMNIPAQUE IOHEXOL 350 MG/ML SOLN COMPARISON:  Head MRI and MRA 07/24/2015 FINDINGS: CT HEAD Brain: Cytotoxic edema is noted in the left greater than right cerebellar hemispheres corresponding to the acute infarcts demonstrated on yesterday's MRI. There is no significant posterior fossa mass effect. Cerebral atrophy is unchanged. There is no evidence of acute intracranial hemorrhage, mass, midline shift, or extra-axial fluid collection. Calvarium and skull base: No skull fracture or destructive osseous lesion. Paranasal sinuses: Mild bilateral ethmoid air cell mucosal thickening. Orbits: Unremarkable. CTA NECK Aortic arch: 3 vessel aortic arch. Brachiocephalic and subclavian arteries are widely patent. Right carotid system: Patent with minimal non stenotic plaque at the carotid bifurcation. Left carotid system: Patent with mild non stenotic calcified plaque at the carotid bifurcation. Vertebral arteries: Patent with the left being minimally larger than the right. No significant stenosis identified, although the vertebral artery origins are partially obscured bilaterally by streak artifact. Skeleton: Mild diffuse cervical disc degeneration. Other neck: Mild motion artifact and minimal dependent atelectasis in the visualized lungs. 4 mm right upper lobe lung nodule (series 6, image 3). CTA HEAD Anterior circulation: The internal carotid arteries are patent from skullbase to carotid termini without stenosis. ACAs and MCAs are patent without evidence of significant stenosis. Mild distal left M1 stenosis on MRA is not  confirmed. No intracranial aneurysm is identified. Posterior circulation: The intracranial vertebral arteries are widely patent and both contribute to the basilar. AICA and SCA origins are patent. Basilar artery is patent without stenosis. A pair proximal basilar  artery stenosis on MRA was likely artifactual. There is a small right posterior communicating artery. PCAs are patent with a severe proximal P2 stenosis on the left. No significant right PCA stenosis. Venous sinuses: Patent. Anatomic variants: None. Delayed phase: No abnormal enhancement. IMPRESSION: 1. No major intracranial arterial occlusion. Widely patent basilar artery, with appearance on earlier MRA being artifactual. 2. Severe proximal left P2 PCA stenosis. No significant anterior circulation stenosis. 3. No carotid or vertebral artery stenosis in the neck. 4. 4 mm right upper lobe lung nodule. If the patient is at high risk for bronchogenic carcinoma, follow-up chest CT at 1 year is recommended. If the patient is at low risk, no follow-up is needed. This recommendation follows the consensus statement: Guidelines for Management of Small Pulmonary Nodules Detected on CT Scans: A Statement from the Fleischner Society as published in Radiology 2005; 237:395-400. Electronically Signed   By: Sebastian Ache M.D.   On: 07/25/2015 15:41   Mr Maxine Glenn Head Wo Contrast  07/24/2015  CLINICAL DATA:  BILATERAL cerebellar infarcts. Continued surveillance. EXAM: MRA HEAD WITHOUT CONTRAST TECHNIQUE: Angiographic images of the Circle of Willis were obtained using MRA technique without intravenous contrast. COMPARISON:  MRI brain 07/24/2015.  CT head 07/23/2015. FINDINGS: The internal carotid arteries are dolichoectatic but widely patent throughout their cervical, petrous, cavernous and supraclinoid segments. Signal loss in the vertical petrous segment of the LEFT ICA appears artifactual on axial source images. There is focal narrowing, estimated 50-75% stenosis, of the  basilar artery in its proximal aspect, extending over a length of approximately 5 mm from just above the vertebral confluence to just below the anterior inferior cerebellar artery origins. There is diminished caliber also of the distal V4 segment of the RIGHT vertebral, estimated 50-75% stenosis. Intracranial atherosclerosis is suspected. The RIGHT M1 MCA is widely patent. The LEFT M1 MCA shows focal narrowing in its distal M1 segment, suspected 50% stenosis. Poor flow related enhancement in the BILATERAL M2 and M3 MCA segments is probably technical in nature rather than representing intracranial atherosclerosis, based on review of the axial source images. Both anterior cerebral arteries are patent but mildly irregular, more so on the RIGHT. No flow-limiting stenosis is evident. Both posterior cerebral arteries originate from the basilar tip. Minor non stenotic irregularity is observed on the RIGHT. Focal high-grade tandem stenoses of the LEFT P2 PCA segment estimated to be 75-90%, more severe distally. Both superior cerebellar artery origins appear widely patent. Both vessels are observed for at least 2 cm from the origin and do not appear significantly diseased. The RIGHT and LEFT anterior inferior cerebellar arteries both appear patent based on axial source images but are poorly seen on MIP images. BILATERAL PICA vessels are poorly visualized. No intracranial aneurysm. 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 Intracranial atherosclerosis is suspected. Other areas of intracranial atherosclerotic narrowing include LEFT M1 MCA (50%), and LEFT P2 PCA segment (75-90% tandem lesions). No proximal flow reducing lesion of the LEFT or RIGHT superior cerebellar arteries is observed. Electronically Signed   By: Elsie Stain M.D.   On: 07/24/2015 16:09   Mr Brain Wo Contrast  07/24/2015  CLINICAL DATA:  Possible head injury 5 days ago. History of  hypertension. Hand clumsiness for 2 days. Headache. EXAM: MRI HEAD WITHOUT CONTRAST TECHNIQUE: Multiplanar, multiecho pulse sequences of the brain and surrounding structures were obtained without intravenous contrast. COMPARISON:  CT head 07/23/2015. FINDINGS: Focal and confluent areas of restricted diffusion  affect the LEFT greater than RIGHT cerebellum. The largest area of confluent abnormal signal involves the LEFT superior cerebellar hemisphere, LEFT SCA distribution. Similar lesser involvement RIGHT SCA territory, with a wedge-shaped cortical infarct, more laterally. Cerebellar deep white matter involvement on the LEFT involves a portion of the dentate nucleus. No acute infarction of the vermis, or inferior cerebellum. No brainstem or supratentorial involvement. No hemorrhagic transformation. Generalized atrophy with hydrocephalus ex vacuo. Minor white matter disease, likely chronic microvascular ischemic change. No chronic hemorrhage. Flow voids are maintained in the carotid, basilar, and both vertebral arteries. No midline abnormality. Minor chronic sinus disease. Minor RIGHT mastoid effusion. Negative orbits. Extracranial soft tissues unremarkable. Compared with yesterday's CT, the areas of infarction are not clearly visible. IMPRESSION: Focal and confluent areas of acute nonhemorrhagic infarction affecting the BILATERAL cerebellar hemispheres, with greatest involvement in the LEFT superior cerebellar artery territory. No brainstem involvement. Given the different vascular territories involved, shower of emboli is suspected. No posttraumatic sequelae are evident. No appreciable stenosis or occlusion of the BILATERAL distal vertebral arteries and basilar artery. If further investigation desired, consider extracranial and intracranial MRA for further evaluation. Electronically Signed   By: Elsie Stain M.D.   On: 07/24/2015 13:06       Today   Subjective:   Roshon Duell  feels okay denies any  complaints  Objective:   Blood pressure 153/98, pulse 69, temperature 98.4 F (36.9 C), temperature source Oral, resp. rate 20, height  (1.727 m), weight 104.826 kg (231 lb 1.6 oz), SpO2 97 %.  .  Intake/Output Summary (Last 24 hours) at 07/27/15 1302 Last data filed at 07/27/15 1016  Gross per 24 hour  Intake      0 ml  Output      0 ml  Net      0 ml    Exam VITAL SIGNS: Blood pressure 153/98, pulse 69, temperature 98.4 F (36.9 C), temperature source Oral, resp. rate 20, height  (1.727 m), weight 104.826 kg (231 lb 1.6 oz), SpO2 97 %.  GENERAL:  69 y.o.-year-old patient lying in the bed with no acute distress.  EYES: Pupils equal, round, reactive to light and accommodation. No scleral icterus. Extraocular muscles intact.  HEENT: Head atraumatic, normocephalic. Oropharynx and nasopharynx clear.  NECK:  Supple, no jugular venous distention. No thyroid enlargement, no tenderness.  LUNGS: Normal breath sounds bilaterally, no wheezing, rales,rhonchi or crepitation. No use of accessory muscles of respiration.  CARDIOVASCULAR: S1, S2 normal. No murmurs, rubs, or gallops.  ABDOMEN: Soft, nontender, nondistended. Bowel sounds present. No organomegaly or mass.  EXTREMITIES: No pedal edema, cyanosis, or clubbing.  NEUROLOGIC: Cranial nerves II through XII are intact. Muscle strength 5/5 in all extremities. Sensation intact. Gait not checked.  PSYCHIATRIC: The patient is alert and oriented x 3.  SKIN: No obvious rash, lesion, or ulcer.   Data Review     CBC w Diff:  Lab Results  Component Value Date   WBC 9.9 07/23/2015   HGB 14.8 07/23/2015   HCT 42.8 07/23/2015   PLT 263 07/23/2015   LYMPHOPCT 10 07/23/2015   MONOPCT 5 07/23/2015   EOSPCT 1 07/23/2015   BASOPCT 1 07/23/2015   CMP:  Lab Results  Component Value Date   NA 139 07/26/2015   K 3.8 07/26/2015   CL 106 07/26/2015   CO2 27 07/26/2015   BUN 15 07/26/2015   CREATININE 1.34* 07/26/2015   PROT 6.1*  07/26/2015   ALBUMIN 3.3* 07/26/2015  BILITOT 0.7 07/26/2015   ALKPHOS 72 07/26/2015   AST 18 07/26/2015   ALT 20 07/26/2015  .  Micro Results No results found for this or any previous visit (from the past 240 hour(s)).      Code Status Orders        Start     Ordered   07/23/15 2046  Full code   Continuous     07/23/15 2045    Advance Directive Documentation        Most Recent Value   Type of Advance Directive  Healthcare Power of Attorney   Pre-existing out of facility DNR order (yellow form or pink MOST form)     "MOST" Form in Place?                Follow-up Information    Follow up with pcp In 7 days.      Follow up with [facility], Durene CalHEMANG K, MD On 09/29/2014.   Specialty:  Neurology   Why:  at 9:30. Possible parkinson's dz   Contact information:   [address] [facility] [place] [id number] [phone]       Discharge Medications     Medication List    TAKE these medications        aspirin 81 MG EC tablet  Take 1 tablet (81 mg total) by mouth daily.     atorvastatin 40 MG tablet  Commonly known as:  LIPITOR  Take 1 tablet (40 mg total) by mouth daily at 6 PM.     carbidopa-levodopa 25-100 MG tablet  Commonly known as:  SINEMET IR  Take 1 tablet by mouth 3 (three) times daily.     cetirizine 10 MG tablet  Commonly known as:  ZYRTEC  Take 10 mg by mouth daily.     cholecalciferol 1000 UNITS tablet  Commonly known as:  VITAMIN D  Take 2,000 Units by mouth daily.     clopidogrel 75 MG tablet  Commonly known as:  PLAVIX  Take 1 tablet (75 mg total) by mouth daily.     clorazepate 7.5 MG tablet  Commonly known as:  TRANXENE  Take 1 tablet by mouth daily.     docusate sodium 50 MG capsule  Commonly known as:  COLACE  Take 50 mg by mouth 2 (two) times daily.     escitalopram 20 MG tablet  Commonly known as:  LEXAPRO  Take 20 mg by mouth daily.     esomeprazole 40 MG capsule  Commonly known as:  NEXIUM   Take 40 mg by mouth daily at 12 noon.     hydrochlorothiazide 25 MG tablet  Commonly known as:  HYDRODIURIL  Take 25 mg by mouth daily.     HYDROcodone-acetaminophen 5-325 MG tablet  Commonly known as:  NORCO/VICODIN  Take 1-2 tablets by mouth every 4 (four) hours as needed for moderate pain (Max 7 per day).     mirtazapine 30 MG disintegrating tablet  Commonly known as:  REMERON SOL-TAB  Take 1 tablet by mouth daily.     quinapril 40 MG tablet  Commonly known as:  ACCUPRIL  Take 40 mg by mouth at bedtime.           Total Time in preparing paper work, data evaluation and todays exam - 35 minutes  Auburn BilberryPATEL, Kristofer Schaffert M.D on 07/27/2015 at 1:02 PM  [facility]   Office  [phone]

## 2015-07-27 NOTE — Progress Notes (Signed)
*  PRELIMINARY RESULTS* Echocardiogram Echocardiogram Transesophageal has been performed.  Georgann HousekeeperJerry R Hege 07/27/2015, 10:54 AM

## 2015-07-27 NOTE — Procedures (Signed)
TEE  Indication: Cardiac source of emboli Sedation: 5 mg iv versed; 125 mg iv fentanyl  After informed consent, time out protocol and adequate sedation, tee probe inserted. Images obtained. Probe removed. No immediate complications  Preliminary findings.  No thrombus in left atrium or left atrial appendage. Normal mitral valve with trivial mr. Tricuspid valve normal with mild tr. 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

## 2015-07-27 NOTE — Plan of Care (Signed)
Problem: Education: Goal: Knowledge of disease or condition will improve Outcome: Progressing Pt has to be reminded of his treatment r/t his stroke and his upcoming TEE.  Problem: Coping: Goal: Ability to identify appropriate support needs will improve Outcome: Progressing Pt is forgetful at times, and has to be reoriented to treatment regime.

## 2015-07-27 NOTE — OR Nursing (Signed)
Dr Lady Garyfath ok with lovenox dose last pm

## 2015-07-27 NOTE — Clinical Social Work Note (Signed)
[facility]. [employer] Ber[employer] Harveyauth has been obtained and [employer] as well. Pt and wife are aware and agreeable to discharge plan. Facility has received discharge information and is ready to accept pt. RN called report and EMS will provide transportation. CSW is signing off as no further needs identified.   Dede QuerySarah Neven Fina, MSW, LCSW Clinical Social Worker  [phone]

## 2015-07-27 NOTE — Progress Notes (Signed)
[facility] to [facility]. Alert but with some confusion.  Neuro saw pt. Pt went for tee this am. tol well.  Pt anxious  Upon return  D/t being  Sent to  Rehab facility .  Ativan given and pt calmer after a while. [facility]  And pt to go to room 208 A

## 2015-07-27 NOTE — Progress Notes (Signed)
PT Cancellation Note  Patient Details Name: Maurice Moses MRN: [id number] DOB: 08/27/1945   Cancelled Treatment:    Reason Eval/Treat Not Completed: Patient at procedure or test/unavailable. Chart reviewed, RN consulted. Pt out of room for EEG per nursing, and also has DC orders in.    Maurice Moses 07/27/2015, 12:29 PM  12:30 PM  Maurice Moses, PT, DPT [place] License # [id number]

## 2015-07-27 NOTE — Progress Notes (Signed)
NEUROLOGY NOTE  S: Pt agitated last night.  Has not been sleeping well.  Pt was initially upset about TEE timing.  Pt does not want to go to rehab today  ROS reviewed and neg x 8 systems  O: 98.4    170/93    65    16 Nl weight, mild distress Normocephalic, oropharynx clear Supple, no LAD CTA B, no wheezing RRR, no murmur No C/C/E  Alert and oriented x 2 not time, follows most commands PERRLA, EOMI, face symmetric 5/5 B  A/P: 1. Bilateral cerebellar infarcts- stable likely causing ataxia only 2.  Parkinsonism-  Unchanged on sinemet, maybe not idiopathic but more [place] 3.  Confusion-  Not sure if lack of sleep or underlying true sleep problem -  TEE and loop today -  Continue Sinemet 25/100mg  TID but not sure if this is contributing to 3 -  Continue ASA, plavix and statin -  Start Seroquel 25mg  qHS -  Up out of bed to chair TID -  Will follow and likely able to go to rehab tomorrow

## 2015-07-27 NOTE — Clinical Social Work Placement (Signed)
   CLINICAL SOCIAL WORK PLACEMENT  NOTE  Date:  07/27/2015  Patient Details  Name: Maurice Moses MRN: [id number] Date of Birth: 08/26/1945  Clinical Social Work is seeking post-discharge placement for this patient at the Skilled  Nursing Facility level of care (*CSW will initial, date and re-position this form in  chart as items are completed):  Yes   Patient/family provided with [facility] Clinical Social Work Department's list of facilities offering this level of care within the geographic area requested by the patient (or if unable, by the patient's family).  Yes   Patient/family informed of their freedom to choose among providers that offer the needed level of care, that participate in Medicare, Medicaid or managed care program needed by the patient, have an available bed and are willing to accept the patient.  Yes   Patient/family informed of [facility]'s ownership interest in [facility] and [facility], as well as of the fact that they are under no obligation to receive care at these facilities.  PASRR submitted to EDS on 07/25/15     PASRR number received on 07/27/15     Existing PASRR number confirmed on       FL2 transmitted to all facilities in geographic area requested by pt/family on 07/25/15     FL2 transmitted to all facilities within larger geographic area on       Patient informed that his/her managed care company has contracts with or will negotiate with certain facilities, including the following:        Yes   Patient/family informed of bed offers received.  [facility]     Physician recommends and patient chooses bed at  [facility](S[facility])    [facility].  Patient to be [facility] facility [facility]     Patient family notified on 07/27/15 of transfer.  Name of family member notified:  Pt's son, Brett CanalesSteve.      PHYSICIAN       Additional Comment:     _______________________________________________ Dede QuerySarah Kashayla Ungerer, LCSW 07/27/2015, 2:52 PM

## 2015-08-04 LAB — ACETYLCHOLINE RECEPTOR AB, ALL: Acetylchol Block Ab: 21 % (ref 0–25)

## 2015-08-04 LAB — HIGH SENSITIVITY CRP: CRP HIGH SENSITIVITY: 10.99 mg/L — AB (ref 0.00–3.00)

## 2015-08-04 LAB — MYOGLOBIN, SERUM: MYOGLOBIN: 50 ng/mL (ref 28–72)

## 2015-10-13 ENCOUNTER — Encounter: Payer: Self-pay | Admitting: Emergency Medicine

## 2015-10-13 ENCOUNTER — Emergency Department
Admission: EM | Admit: 2015-10-13 | Discharge: 2015-10-13 | Disposition: A | Discharge status: Other Acute Inpatient Hospital | Payer: Medicare Other | Attending: Emergency Medicine | Admitting: Emergency Medicine

## 2015-10-13 ENCOUNTER — Ambulatory Visit (HOSPITAL_COMMUNITY)
Admission: AD | Admit: 2015-10-13 | Discharge: 2015-10-13 | Disposition: A | Discharge status: Home / Self Care | Payer: Medicare Other | Source: Other Acute Inpatient Hospital | Attending: Psychiatry | Admitting: Psychiatry

## 2015-10-13 ENCOUNTER — Inpatient Hospital Stay
Admission: AD | Admit: 2015-10-13 | Payer: Self-pay | Source: Other Acute Inpatient Hospital | Admitting: Internal Medicine

## 2015-10-13 ENCOUNTER — Emergency Department: Payer: Medicare Other

## 2015-10-13 DIAGNOSIS — Y9389 Activity, other specified: Secondary | ICD-10-CM | POA: Insufficient documentation

## 2015-10-13 DIAGNOSIS — R42 Dizziness and giddiness: Secondary | ICD-10-CM | POA: Diagnosis present

## 2015-10-13 DIAGNOSIS — I6200 Nontraumatic subdural hemorrhage, unspecified: Secondary | ICD-10-CM | POA: Diagnosis present

## 2015-10-13 DIAGNOSIS — Z79899 Other long term (current) drug therapy: Secondary | ICD-10-CM | POA: Diagnosis not present

## 2015-10-13 DIAGNOSIS — Z7982 Long term (current) use of aspirin: Secondary | ICD-10-CM | POA: Insufficient documentation

## 2015-10-13 DIAGNOSIS — Y998 Other external cause status: Secondary | ICD-10-CM | POA: Insufficient documentation

## 2015-10-13 DIAGNOSIS — W1839XA Other fall on same level, initial encounter: Secondary | ICD-10-CM | POA: Insufficient documentation

## 2015-10-13 DIAGNOSIS — S065X9A Traumatic subdural hemorrhage with loss of consciousness of unspecified duration, initial encounter: Secondary | ICD-10-CM

## 2015-10-13 DIAGNOSIS — R32 Unspecified urinary incontinence: Secondary | ICD-10-CM | POA: Diagnosis not present

## 2015-10-13 DIAGNOSIS — Y9289 Other specified places as the place of occurrence of the external cause: Secondary | ICD-10-CM | POA: Diagnosis not present

## 2015-10-13 DIAGNOSIS — Z7902 Long term (current) use of antithrombotics/antiplatelets: Secondary | ICD-10-CM | POA: Diagnosis not present

## 2015-10-13 DIAGNOSIS — Z87891 Personal history of nicotine dependence: Secondary | ICD-10-CM | POA: Insufficient documentation

## 2015-10-13 DIAGNOSIS — I10 Essential (primary) hypertension: Secondary | ICD-10-CM | POA: Diagnosis not present

## 2015-10-13 DIAGNOSIS — S065X0A Traumatic subdural hemorrhage without loss of consciousness, initial encounter: Secondary | ICD-10-CM | POA: Insufficient documentation

## 2015-10-13 DIAGNOSIS — S065XAA Traumatic subdural hemorrhage with loss of consciousness status unknown, initial encounter: Secondary | ICD-10-CM

## 2015-10-13 DIAGNOSIS — R410 Disorientation, unspecified: Secondary | ICD-10-CM | POA: Diagnosis not present

## 2015-10-13 HISTORY — DX: Cerebral infarction, unspecified: I63.9

## 2015-10-13 LAB — CBC WITH DIFFERENTIAL/PLATELET
BASOS PCT: 1 %
Basophils Absolute: 0.1 10*3/uL (ref 0–0.1)
EOS ABS: 0.1 10*3/uL (ref 0–0.7)
Eosinophils Relative: 1 %
HCT: 40.5 % (ref 40.0–52.0)
HEMOGLOBIN: 14.2 g/dL (ref 13.0–18.0)
Lymphocytes Relative: 13 %
Lymphs Abs: 1.2 10*3/uL (ref 1.0–3.6)
MCH: 30.7 pg (ref 26.0–34.0)
MCHC: 35.1 g/dL (ref 32.0–36.0)
MCV: 87.4 fL (ref 80.0–100.0)
Monocytes Absolute: 0.7 10*3/uL (ref 0.2–1.0)
Monocytes Relative: 8 %
NEUTROS PCT: 77 %
Neutro Abs: 7.1 10*3/uL — ABNORMAL HIGH (ref 1.4–6.5)
Platelets: 238 10*3/uL (ref 150–440)
RBC: 4.63 MIL/uL (ref 4.40–5.90)
RDW: 13.4 % (ref 11.5–14.5)
WBC: 9.2 10*3/uL (ref 3.8–10.6)

## 2015-10-13 LAB — URINALYSIS COMPLETE WITH MICROSCOPIC (ARMC ONLY)
Bacteria, UA: NONE SEEN
Bilirubin Urine: NEGATIVE
Glucose, UA: NEGATIVE mg/dL
Hgb urine dipstick: NEGATIVE
KETONES UR: NEGATIVE mg/dL
Leukocytes, UA: NEGATIVE
NITRITE: NEGATIVE
PH: 7 (ref 5.0–8.0)
PROTEIN: NEGATIVE mg/dL
RBC / HPF: NONE SEEN RBC/hpf (ref 0–5)
SPECIFIC GRAVITY, URINE: 1.011 (ref 1.005–1.030)
Squamous Epithelial / LPF: NONE SEEN

## 2015-10-13 LAB — BASIC METABOLIC PANEL
Anion gap: 6 (ref 5–15)
BUN: 14 mg/dL (ref 6–20)
CALCIUM: 9.4 mg/dL (ref 8.9–10.3)
CHLORIDE: 103 mmol/L (ref 101–111)
CO2: 29 mmol/L (ref 22–32)
CREATININE: 1.24 mg/dL (ref 0.61–1.24)
GFR, EST NON AFRICAN AMERICAN: 57 mL/min — AB (ref >60)
Glucose, Bld: 120 mg/dL — ABNORMAL HIGH (ref 65–99)
Potassium: 3.5 mmol/L (ref 3.5–5.1)
SODIUM: 138 mmol/L (ref 135–145)

## 2015-10-13 LAB — ABO/RH: ABO/RH(D): A POS

## 2015-10-13 MED ORDER — SODIUM CHLORIDE 0.9 % IV SOLN: 10.0000 mL/h | Freq: Once | INTRAVENOUS | Status: DC

## 2015-10-13 MED ORDER — HYDROCODONE-ACETAMINOPHEN 5-325 MG PO TABS
1.0000 | ORAL_TABLET | ORAL | Status: DC | PRN
  Administered 2015-10-13 (×2): 2 via ORAL

## 2015-10-13 MED ORDER — SODIUM CHLORIDE 0.9 % IV SOLN
750.0000 mg | Freq: Once | INTRAVENOUS | Status: AC
  Administered 2015-10-13: 750 mg via INTRAVENOUS
  Filled 2015-10-13: qty 7.5

## 2015-10-13 NOTE — ED Notes (Signed)
Pt presents via [facility]. Per EMS pt c/o dizziness and blurred vision after a fall on Monday. EMS states that patient was supposed to see cardiologist for a heart monitor placement for possible A-fib. Pt states this morning he had worsening double vision and nausea. EMS states that gave 4 of zofran en route for nausea. Pt presents in NAD at this time.

## 2015-10-13 NOTE — ED Notes (Signed)
Per MD, pt passed MD swallow screen with no difficulty. MD states pt ate cracker, took a pill, and swallowed his own drink with no noted difficulty. MD states okay for patient to eat and drink at this time.

## 2015-10-13 NOTE — ED Notes (Signed)
Pt taken to [facility] via [place]. NAD noted at time of transfer.

## 2015-10-13 NOTE — ED Notes (Signed)
Patient waiting on Care link to arrive for transport to [facility]

## 2015-10-13 NOTE — ED Notes (Signed)
Consent for blood products received and placed on patient chart at this time. Pt family at bedside at this time. No change in patient condition at this time.

## 2015-10-13 NOTE — ED Provider Notes (Addendum)
[facility] Emergency Department Provider Note   ____________________________________________  Time seen: Approximately 7:30 AM I have reviewed the triage vital signs and the triage nursing note.  HISTORY  Chief Complaint Fall; Dizziness; and Blurred Vision   Historian Patient, somewhat poor historian Wife provides additional history  HPI Maurice Moses is a 70 y.o. 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 fib. Symptoms at that time included dizziness. He is here today for evaluation of nausea, and some double vision that has been ongoing intermittently since Monday evening. His wife states she is at home alone, and had an unwitnessed fall. He has chronic headaches and does not report any new headache. However that evening he was complaining of some blurry vision/double vision that he's been complaining of on and off until today.  He has some confusion and he is a poor historian, but this is apparently at baseline per the wife.    Past Medical History  Diagnosis Date  . Hypertension   . Depression   . IBS (irritable bowel syndrome)   . Stroke (cerebrum) [facility]     Patient Active Problem List   Diagnosis Date Noted  . 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 TIA (transient ischemic attack) 07/23/2015    History reviewed. No pertinent past surgical history.  Current Outpatient Rx  Name  Route  Sig  Dispense  Refill  . aspirin EC 81 MG EC tablet   Oral   Take 1 tablet (81 mg total) by mouth daily.         Marland Kitchen atorvastatin (LIPITOR) 40 MG tablet   Oral   Take 1 tablet (40 mg total) by mouth daily at 6 PM.   40 tablet   0   . carbidopa-levodopa (SINEMET IR) 25-100 MG tablet   Oral   Take 1 tablet by mouth 3 (three) times daily.   90 tablet   0   . cetirizine (ZYRTEC) 10 MG tablet   Oral   Take 10 mg by mouth  daily.         . cholecalciferol (VITAMIN D) 1000 UNITS tablet   Oral   Take 2,000 Units by mouth daily.         . clopidogrel (PLAVIX) 75 MG tablet   Oral   Take 1 tablet (75 mg total) by mouth daily.      0   . clorazepate (TRANXENE) 7.5 MG tablet   Oral   Take 1 tablet by mouth daily.         Marland Kitchen docusate sodium (COLACE) 50 MG capsule   Oral   Take 50 mg by mouth 2 (two) times daily.         Marland Kitchen escitalopram (LEXAPRO) 20 MG tablet   Oral   Take 20 mg by mouth daily.         Marland Kitchen esomeprazole (NEXIUM) 40 MG capsule   Oral   Take 40 mg by mouth daily at 12 noon.         . hydrochlorothiazide (HYDRODIURIL) 25 MG tablet   Oral   Take 25 mg by mouth daily.         Marland Kitchen HYDROcodone-acetaminophen (NORCO/VICODIN) 5-325 MG tablet   Oral   Take 1-2 tablets by mouth every 4 (four) hours as needed for moderate pain (Max 7 per day).   30 tablet   0   .  mirtazapine (REMERON SOL-TAB) 30 MG disintegrating tablet   Oral   Take 1 tablet by mouth daily.         . quinapril (ACCUPRIL) 40 MG tablet   Oral   Take 40 mg by mouth at bedtime.           Allergies Morphine and related  Family History  Problem Relation Age of Onset  . Hypertension Other     Social History Social History  Substance Use Topics  . Smoking status: Former Games developer  . Smokeless tobacco: None  . Alcohol Use: No    Review of Systems  Constitutional: Negative for fever. Eyes: Reports far vision that he sees double, but a close sees normal. ENT: Negative for sore throat. Cardiovascular: Negative for chest pain. Respiratory: Negative for shortness of breath. Gastrointestinal: Negative for abdominal pain, vomiting and diarrhea. Genitourinary: Negative for dysuria. He is incontinent Musculoskeletal: Negative for back pain. Skin: Negative for rash. Neurological: Negative for headache currently 10 point Review of Systems otherwise  negative ____________________________________________   PHYSICAL EXAM:  VITAL SIGNS: ED Triage Vitals  Enc Vitals Group     BP 10/13/15 0732 136/87 mmHg     Pulse Rate 10/13/15 0732 81     Resp 10/13/15 0732 13     Temp 10/13/15 0732 98 F (36.7 C)     Temp Source 10/13/15 0732 Oral     SpO2 10/13/15 0729 96 %     Weight 10/13/15 0732 230 lb (104.327 kg)     Height 10/13/15 0732  (1.727 m)     Head Cir --      Peak Flow --      Pain Score 10/13/15 0732 5     Pain Loc --      Pain Edu? --      Excl. in GC? --      Constitutional: Alert and cooperative, poor historian. Well appearing and in no distress. HEENT   Head: Normocephalic and atraumatic.      Eyes: Conjunctivae are normal. PERRL. Normal extraocular movements.      Ears:         Nose: No congestion/rhinnorhea.   Mouth/Throat: Mucous membranes are moist.   Neck: No stridor. Cardiovascular/Chest: Normal rate, regular rhythm.  No murmurs, rubs, or gallops. Respiratory: Normal respiratory effort without tachypnea nor retractions. Breath sounds are clear and equal bilaterally. No wheezes/rales/rhonchi. Gastrointestinal: Soft. No distention, no guarding, no rebound. Nontender.    Genitourinary/rectal:Deferred Musculoskeletal: Nontender with normal range of motion in all extremities. No joint effusions.  No lower extremity tenderness.  No edema. Neurologic:  Somewhat slow to come up with answers, but no aphasia or slurred speech. No facial droop.. No gross or focal neurologic deficits are appreciated. Skin:  Skin is warm, dry and intact. No rash noted. Psychiatric: Mood and affect are normal. Speech and behavior are normal. Patient exhibits appropriate insight and judgment.  ____________________________________________   EKG I, Governor Rooks, MD, the attending physician have personally viewed and interpreted all ECGs.  None ____________________________________________  LABS (pertinent  positives/negatives)  Urinalysis negative basic mental panel within normal limits CBC without significant abnormality. Platelet count 238  ____________________________________________  RADIOLOGY All Xrays were viewed by me. Imaging interpreted by Radiologist.  CT head noncontrast:  IMPRESSION: Subdural hematoma along the right side of the falx and right side of the tentorium. There is no significant midline shift.  Cerebral atrophy.  Old infarct in the left cerebral hemisphere.  Critical Value/emergent results were called  by telephone at the time of interpretation on 10/13/2015 at 8:13 am to Dr. Dolores Frame , who verbally acknowledged these results. __________________________________________  PROCEDURES  Procedure(s) performed: None  Critical Care performed: CRITICAL CARE Performed by: Governor Rooks   Total critical care time: 30 minutes  Critical care time was exclusive of separately billable procedures and treating other patients.  Critical care was necessary to treat or prevent imminent or life-threatening deterioration.  Critical care was time spent personally by me on the following activities: development of treatment plan with patient and/or surrogate as well as nursing, discussions with consultants, evaluation of patient's response to treatment, examination of patient, obtaining history from patient or surrogate, ordering and performing treatments and interventions, ordering and review of laboratory studies, ordering and review of radiographic studies, pulse oximetry and re-evaluation of patient's condition.   ____________________________________________   ED COURSE / ASSESSMENT AND PLAN  Pertinent labs & imaging results that were available during my care of the patient were reviewed by me and considered in my medical decision making (see chart for details).   The complaint is intermittent blurry/double vision and nausea since Monday afternoon with an unwitnessed fall  and Monday. Wife believes that he probably tripped over something, although the patient has a poor memory and can't exactly remember what happened.  CT shows subdural hematoma along the falx. Patient has been on aspirin and Plavix for the last few days after the likely incident occurred with a fall on Monday. He is stable, and at this point does not need acute neurosurgical intervention, however he will need observation hospitalization and backup neurosurgery which is not available at [facility].  In discussion with the family, I attempted transfer to [facility] symptoms initially, and they were full other than [facility], and family felt this was too far. Neurosurgeon did recommend 750 mg of IV Keppra and pack of platelets, and these were ordered. Of course, aspirin and Plavix will be discontinued.  I attempted transfer to [facility], and after their hospitals also full and not accepting transfers.  I discussed this case with Dr. Lovell Sheehan, neurosurgery at [facility] who agreed no acute neurosurgical intervention, but patient will be admitted to medical service for observation at [facility] with Dr. Randol Kern accepting to medicine.  Family and patient were updated.   CONSULTATIONS:   Multiple transfer conversations with neurosurgeons/hospitalist at [place], [facility], [place], and [place].   Patient / Family / Caregiver informed of clinical course, medical decision-making process, and agree with plan.   ----------------------------------------- 2:49 PM on 10/13/2015 -----------------------------------------  In calling back to [facility], no beds available and reportedly patients have been waiting for greater than 12 hours for a bed. It is not advisable for this patient with an acute head bleed to continue boarding in the emergency department. I did consult with neurosurgeon Dr. Cherylynn Ridges at [facility] and accepted in transfer.  Transfer canceled at [facility].  ___________________________________________   FINAL CLINICAL IMPRESSION(S) / ED DIAGNOSES   Final diagnoses:  Subdural hematoma (HCC)              Note: This dictation was prepared with Dragon dictation. Any transcriptional errors that result from this process are unintentional   Governor Rooks, MD 10/13/15 1139  Governor Rooks, MD 10/13/15 1452

## 2015-10-14 LAB — PREPARE PLATELET PHERESIS: Unit division: 0

## 2016-02-09 ENCOUNTER — Encounter: Payer: Self-pay | Admitting: Emergency Medicine

## 2016-02-09 ENCOUNTER — Emergency Department
Admission: EM | Admit: 2016-02-09 | Discharge: 2016-02-09 | Disposition: A | Discharge status: Home / Self Care | Payer: Medicare Other | Attending: Emergency Medicine | Admitting: Emergency Medicine

## 2016-02-09 ENCOUNTER — Emergency Department: Payer: Medicare Other

## 2016-02-09 ENCOUNTER — Other Ambulatory Visit: Payer: Self-pay

## 2016-02-09 DIAGNOSIS — F329 Major depressive disorder, single episode, unspecified: Secondary | ICD-10-CM | POA: Diagnosis not present

## 2016-02-09 DIAGNOSIS — I10 Essential (primary) hypertension: Secondary | ICD-10-CM | POA: Insufficient documentation

## 2016-02-09 DIAGNOSIS — Z7982 Long term (current) use of aspirin: Secondary | ICD-10-CM | POA: Insufficient documentation

## 2016-02-09 DIAGNOSIS — Z8673 Personal history of transient ischemic attack (TIA), and cerebral infarction without residual deficits: Secondary | ICD-10-CM | POA: Insufficient documentation

## 2016-02-09 DIAGNOSIS — Z87891 Personal history of nicotine dependence: Secondary | ICD-10-CM | POA: Diagnosis not present

## 2016-02-09 DIAGNOSIS — J189 Pneumonia, unspecified organism: Secondary | ICD-10-CM | POA: Diagnosis not present

## 2016-02-09 DIAGNOSIS — R531 Weakness: Secondary | ICD-10-CM | POA: Diagnosis present

## 2016-02-09 DIAGNOSIS — Z7902 Long term (current) use of antithrombotics/antiplatelets: Secondary | ICD-10-CM | POA: Insufficient documentation

## 2016-02-09 DIAGNOSIS — Z79899 Other long term (current) drug therapy: Secondary | ICD-10-CM | POA: Diagnosis not present

## 2016-02-09 HISTORY — DX: Nontraumatic subdural hemorrhage, unspecified: I62.00

## 2016-02-09 LAB — CBC WITH DIFFERENTIAL/PLATELET
Basophils Absolute: 0.1 10*3/uL (ref 0–0.1)
EOS ABS: 0 10*3/uL (ref 0–0.7)
Eosinophils Relative: 0 %
HEMATOCRIT: 43 % (ref 40.0–52.0)
HEMOGLOBIN: 14.5 g/dL (ref 13.0–18.0)
Lymphocytes Relative: 6 %
Lymphs Abs: 0.9 10*3/uL — ABNORMAL LOW (ref 1.0–3.6)
MCH: 30 pg (ref 26.0–34.0)
MCHC: 33.8 g/dL (ref 32.0–36.0)
MCV: 88.7 fL (ref 80.0–100.0)
Monocytes Absolute: 0.7 10*3/uL (ref 0.2–1.0)
NEUTROS ABS: 11.9 10*3/uL — AB (ref 1.4–6.5)
Platelets: 210 10*3/uL (ref 150–440)
RBC: 4.85 MIL/uL (ref 4.40–5.90)
RDW: 13.3 % (ref 11.5–14.5)
WBC: 13.6 10*3/uL — AB (ref 3.8–10.6)

## 2016-02-09 LAB — COMPREHENSIVE METABOLIC PANEL
ALBUMIN: 4.2 g/dL (ref 3.5–5.0)
ALK PHOS: 89 U/L (ref 38–126)
ALT: 29 U/L (ref 17–63)
AST: 22 U/L (ref 15–41)
Anion gap: 8 (ref 5–15)
BILIRUBIN TOTAL: 0.4 mg/dL (ref 0.3–1.2)
BUN: 19 mg/dL (ref 6–20)
CALCIUM: 9.8 mg/dL (ref 8.9–10.3)
CO2: 26 mmol/L (ref 22–32)
CREATININE: 1.56 mg/dL — AB (ref 0.61–1.24)
Chloride: 100 mmol/L — ABNORMAL LOW (ref 101–111)
GFR calc Af Amer: 50 mL/min — ABNORMAL LOW (ref >60)
GFR calc non Af Amer: 43 mL/min — ABNORMAL LOW (ref >60)
GLUCOSE: 107 mg/dL — AB (ref 65–99)
Potassium: 3.9 mmol/L (ref 3.5–5.1)
SODIUM: 134 mmol/L — AB (ref 135–145)
TOTAL PROTEIN: 6.9 g/dL (ref 6.5–8.1)

## 2016-02-09 LAB — URINALYSIS COMPLETE WITH MICROSCOPIC (ARMC ONLY)
BILIRUBIN URINE: NEGATIVE
Glucose, UA: NEGATIVE mg/dL
HGB URINE DIPSTICK: NEGATIVE
Ketones, ur: NEGATIVE mg/dL
LEUKOCYTES UA: NEGATIVE
Nitrite: NEGATIVE
PH: 6 (ref 5.0–8.0)
Protein, ur: NEGATIVE mg/dL
Specific Gravity, Urine: 1.015 (ref 1.005–1.030)

## 2016-02-09 LAB — LACTIC ACID, PLASMA: Lactic Acid, Venous: 1.3 mmol/L (ref 0.5–2.0)

## 2016-02-09 MED ORDER — SODIUM CHLORIDE 0.9 % IV BOLUS (SEPSIS)
500.0000 mL | Freq: Once | INTRAVENOUS | Status: AC
  Administered 2016-02-09: 500 mL via INTRAVENOUS

## 2016-02-09 MED ORDER — ACETAMINOPHEN 500 MG PO TABS
1000.0000 mg | ORAL_TABLET | Freq: Once | ORAL | Status: AC
  Administered 2016-02-09: 1000 mg via ORAL
  Filled 2016-02-09: qty 2

## 2016-02-09 MED ORDER — AZITHROMYCIN 250 MG PO TABS: ORAL_TABLET | ORAL | Status: DC

## 2016-02-09 MED ORDER — CEFTRIAXONE SODIUM 1 G IJ SOLR
1.0000 g | Freq: Once | INTRAMUSCULAR | Status: AC
  Administered 2016-02-09: 1 g via INTRAVENOUS
  Filled 2016-02-09: qty 10

## 2016-02-09 NOTE — Discharge Instructions (Signed)

## 2016-02-09 NOTE — ED Provider Notes (Signed)
Time Seen: Approximately *1120**  I have reviewed the triage notes  Chief Complaint: Weakness and Fever   History of Present Illness: Maurice Moses is a 70 y.o. male who presents with a previous history of a cerebellar stroke along with a subdural hemorrhage. Patient was noted to have some generalized weakness today and a low-grade fever. Temperature reached a maximum of 101.1 at home. He denies any focal weakness. He was outside mowing the lawn 2 days ago. He denies any recent trauma. Headache is mild and mainly frontal in nature with no nausea, vomiting or photophobia. He denies any neck pain or stiffness. He has chronic shortness of breath he states but has not noticed any change or productive cough. Patient denies any dysuria, hematuria or urinary frequency. Past Medical History  Diagnosis Date  . Hypertension   . Depression   . IBS (irritable bowel syndrome)   . Stroke (cerebrum) (HCC)   . Subdural hemorrhage [facility])     Patient Active Problem List   Diagnosis Date Noted  . 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 TIA (transient ischemic attack) 07/23/2015    History reviewed. No pertinent past surgical history.  History reviewed. No pertinent past surgical history.  Current Outpatient Rx  Name  Route  Sig  Dispense  Refill  . aspirin EC 81 MG EC tablet   Oral   Take 1 tablet (81 mg total) by mouth daily.         Marland Kitchen atorvastatin (LIPITOR) 40 MG tablet   Oral   Take 1 tablet (40 mg total) by mouth daily at 6 PM.   40 tablet   0   . carbidopa-levodopa (SINEMET IR) 25-100 MG tablet   Oral   Take 1 tablet by mouth 3 (three) times daily.   90 tablet   0   . cetirizine (ZYRTEC) 10 MG tablet   Oral   Take 10 mg by mouth daily.         . cholecalciferol (VITAMIN D) 1000 UNITS tablet   Oral   Take 2,000 Units by mouth daily.         . clopidogrel (PLAVIX) 75 MG tablet   Oral   Take 1 tablet (75 mg total) by mouth daily.      0   . clorazepate (TRANXENE)  7.5 MG tablet   Oral   Take 1 tablet by mouth daily.         Marland Kitchen docusate sodium (COLACE) 50 MG capsule   Oral   Take 50 mg by mouth 2 (two) times daily.         Marland Kitchen escitalopram (LEXAPRO) 20 MG tablet   Oral   Take 20 mg by mouth daily.         Marland Kitchen esomeprazole (NEXIUM) 40 MG capsule   Oral   Take 40 mg by mouth daily at 12 noon.         . hydrochlorothiazide (HYDRODIURIL) 25 MG tablet   Oral   Take 25 mg by mouth daily.         Marland Kitchen HYDROcodone-acetaminophen (NORCO/VICODIN) 5-325 MG tablet   Oral   Take 1-2 tablets by mouth every 4 (four) hours as needed for moderate pain (Max 7 per day).   30 tablet   0   . mirtazapine (REMERON SOL-TAB) 30 MG disintegrating tablet   Oral   Take 1 tablet by mouth daily.         . quinapril (ACCUPRIL) 40 MG tablet   Oral  Take 40 mg by mouth at bedtime.           Allergies:  Morphine and related  Family History: Family History  Problem Relation Age of Onset  . Hypertension Other     Social History: Social History  Substance Use Topics  . Smoking status: Former Games developermoker  . Smokeless tobacco: None  . Alcohol Use: No     Review of Systems:   10 point review of systems was performed and was otherwise negative:  Constitutional: No fever Eyes: No visual disturbances ENT: No sore throat, ear pain Cardiac: No chest pain Respiratory: No shortness of breath, wheezing, or stridor Abdomen: No abdominal pain, no vomiting, No diarrhea Endocrine: No weight loss, No night sweats Extremities: No peripheral edema, cyanosis Skin: No rashes, easy bruising Neurologic: No focal weakness, trouble with speech or swollowing Urologic: No dysuria, Hematuria, or urinary frequency   Physical Exam:  ED Triage Vitals  Enc Vitals Group     BP 02/09/16 1047 142/84 mmHg     Pulse Rate 02/09/16 1047 82     Resp 02/09/16 1047 18     Temp 02/09/16 1047 98.8 F (37.1 C)     Temp Source 02/09/16 1047 Oral     SpO2 02/09/16 1047 94 %      Weight 02/09/16 1047 230 lb (104.327 kg)     Height 02/09/16 1047 5\' 9"  (1.753 m)     Head Cir --      Peak Flow --      Pain Score --      Pain Loc --      Pain Edu? --      Excl. in GC? --     General: Awake , Alert , and Oriented times 3; GCS 15 Head: Normal cephalic , atraumatic Eyes: Pupils equal , round, reactive to light Nose/Throat: No nasal drainage, patent upper airway without erythema or exudate. Dry mucous membranes Neck: Supple, Full range of motion, No anterior adenopathy or palpable thyroid masses. No meningeal signs Lungs: Clear to ascultation without wheezes , rhonchi, or rales Heart: Regular rate, regular rhythm without murmurs , gallops , or rubs Abdomen: Soft, non tender without rebound, guarding , or rigidity; bowel sounds positive and symmetric in all 4 quadrants. No organomegaly .        Extremities: 2 plus symmetric pulses. No edema, clubbing or cyanosis Neurologic: normal ambulation, Motor symmetric without deficits, sensory intact. Negative cerebellar signs Skin: warm, dry, no rashes   Labs:   All laboratory work was reviewed including any pertinent negatives or positives listed below:  Labs Reviewed  CULTURE, BLOOD (ROUTINE X 2)  CULTURE, BLOOD (ROUTINE X 2)  CBC WITH DIFFERENTIAL/PLATELET  COMPREHENSIVE METABOLIC PANEL  URINALYSIS COMPLETEWITH MICROSCOPIC ([facility] ONLY)  LACTIC ACID, PLASMA  LACTIC ACID, PLASMA    EKG: ED ECG REPORT I, Jennye MoccasinBrian S Quigley, the attending physician, personally viewed and interpreted this ECG.  Date: 02/09/2016 EKG Time: 1055 Rate: 81 Rhythm: normal sinus rhythm QRS Axis: normal Intervals: Prolonged PR interval ST/T Wave abnormalities: normal Conduction Disturbances: none Narrative Interpretation: unremarkable QRS complexes noticed inferiorly with no acute ischemic changes   Radiology: *       DG Chest 2 View (Final result) Result time: 02/09/16 12:06:21   Final result by Rad Results In Interface  (02/09/16 12:06:21)   Narrative:   CLINICAL DATA: Patient with fever, nausea and weakness.  EXAM: CHEST 2 VIEW  COMPARISON: None.  FINDINGS: Monitoring leads overlie the patient. Low  lung volumes. Normal cardiac and mediastinal contours. Suggestion of focal consolidation within the right medial lower lung. No pleural effusion or pneumothorax. Thoracic spine degenerative changes.  IMPRESSION: Suggestion of focal consolidation within the medial right lower lung may represent atelectasis or infection. Underlying pulmonary nodule not excluded. Followup PA and lateral chest X-ray is recommended in 3-4 weeks following trial of antibiotic therapy to ensure resolution and exclude underlying malignancy.   Electronically Signed By: Annia Beltrew Davis M.D. On: 02/09/2016 12:06       I personally reviewed the radiologic studies    ED Course: * Patient was initiated on fever workup. Fortunately for him his laboratory work shows a negative lactic acid level. Blood cultures 2 are pending. I felt this was unlikely to be a tickborne disease or meningitis at this time. It appears that we do have a source and what appears to be some mild community-acquired pneumonia. The patient was started on IV Rocephin here in emergency Department be discharged on a Zithromax. I reviewed the findings at the bedside with the patient and his wife and they appear to be of understanding and advised to have a low threshold to bring him back especially if he has increased pain and shortness of breath or any other new concerns.    Assessment: * Community-acquired pneumonia     Plan: * Outpatient Prescription for Zithromax, Tylenol over-the-counter Patient was advised to return immediately if condition worsens. Patient was advised to follow up with their primary care physician or other specialized physicians involved in their outpatient care. The patient and/or family member/power of attorney had  laboratory results reviewed at the bedside. All questions and concerns were addressed and appropriate discharge instructions were distributed by the nursing staff.             Jennye MoccasinBrian S Quigley, MD 02/09/16 1355

## 2016-02-09 NOTE — ED Notes (Signed)
Pt. Verbalizes understanding of d/c instructions, prescriptions, and follow-up, but encouraged to call back or return with any concerns. VS stable.  Pt. In NAD at time of d/c and denies concerns. Pt. Wheeled out of the unit by [employer].

## 2016-02-09 NOTE — ED Notes (Signed)
Wife stepped out. Asked to call if any changes arise. Mrs. Amy Wamser, [phone].

## 2016-02-09 NOTE — ED Notes (Signed)
Pt arrived via EMS from home for reports of fever up to 101.1 at home and weakness. EMS reports initial BP 168/100, on the ride over 134/84, 85 HR, CBG 107. Pt alert and oriented on arrival, slow thought processes apparent but is reported as baseline. Pt has history of CVA and subdural bleed.

## 2016-02-09 NOTE — ED Notes (Signed)
MD at bedside. 

## 2016-02-14 LAB — CULTURE, BLOOD (ROUTINE X 2)
CULTURE: NO GROWTH
Culture: NO GROWTH

## 2016-07-31 ENCOUNTER — Emergency Department
Admission: EM | Admit: 2016-07-31 | Discharge: 2016-07-31 | Disposition: A | Discharge status: Home / Self Care | Payer: Medicare Other | Attending: Emergency Medicine | Admitting: Emergency Medicine

## 2016-07-31 ENCOUNTER — Encounter: Payer: Self-pay | Admitting: Emergency Medicine

## 2016-07-31 ENCOUNTER — Emergency Department: Payer: Medicare Other

## 2016-07-31 DIAGNOSIS — Y92002 Bathroom of unspecified non-institutional (private) residence single-family (private) house as the place of occurrence of the external cause: Secondary | ICD-10-CM | POA: Diagnosis not present

## 2016-07-31 DIAGNOSIS — Z87891 Personal history of nicotine dependence: Secondary | ICD-10-CM | POA: Diagnosis not present

## 2016-07-31 DIAGNOSIS — Y999 Unspecified external cause status: Secondary | ICD-10-CM | POA: Insufficient documentation

## 2016-07-31 DIAGNOSIS — I10 Essential (primary) hypertension: Secondary | ICD-10-CM | POA: Diagnosis not present

## 2016-07-31 DIAGNOSIS — Z7982 Long term (current) use of aspirin: Secondary | ICD-10-CM | POA: Diagnosis not present

## 2016-07-31 DIAGNOSIS — W1839XA Other fall on same level, initial encounter: Secondary | ICD-10-CM | POA: Diagnosis not present

## 2016-07-31 DIAGNOSIS — Z79899 Other long term (current) drug therapy: Secondary | ICD-10-CM | POA: Diagnosis not present

## 2016-07-31 DIAGNOSIS — S0990XA Unspecified injury of head, initial encounter: Secondary | ICD-10-CM | POA: Diagnosis present

## 2016-07-31 DIAGNOSIS — Y9301 Activity, walking, marching and hiking: Secondary | ICD-10-CM | POA: Insufficient documentation

## 2016-07-31 DIAGNOSIS — W19XXXA Unspecified fall, initial encounter: Secondary | ICD-10-CM

## 2016-07-31 HISTORY — DX: Cerebral infarction, unspecified: I63.9

## 2016-07-31 LAB — CBC WITH DIFFERENTIAL/PLATELET
BASOS ABS: 0 10*3/uL (ref 0–0.1)
BASOS PCT: 1 %
EOS ABS: 0.2 10*3/uL (ref 0–0.7)
Eosinophils Relative: 2 %
HCT: 42.8 % (ref 40.0–52.0)
HEMOGLOBIN: 15.1 g/dL (ref 13.0–18.0)
Lymphocytes Relative: 17 %
Lymphs Abs: 1.4 10*3/uL (ref 1.0–3.6)
MCH: 31.4 pg (ref 26.0–34.0)
MCHC: 35.2 g/dL (ref 32.0–36.0)
MCV: 89.3 fL (ref 80.0–100.0)
MONOS PCT: 6 %
Monocytes Absolute: 0.5 10*3/uL (ref 0.2–1.0)
NEUTROS ABS: 6.3 10*3/uL (ref 1.4–6.5)
NEUTROS PCT: 74 %
Platelets: 209 10*3/uL (ref 150–440)
RBC: 4.8 MIL/uL (ref 4.40–5.90)
RDW: 13.6 % (ref 11.5–14.5)
WBC: 8.5 10*3/uL (ref 3.8–10.6)

## 2016-07-31 LAB — TROPONIN I

## 2016-07-31 LAB — COMPREHENSIVE METABOLIC PANEL
ALK PHOS: 81 U/L (ref 38–126)
ALT: 31 U/L (ref 17–63)
ANION GAP: 7 (ref 5–15)
AST: 26 U/L (ref 15–41)
Albumin: 4.1 g/dL (ref 3.5–5.0)
BILIRUBIN TOTAL: 0.7 mg/dL (ref 0.3–1.2)
BUN: 22 mg/dL — ABNORMAL HIGH (ref 6–20)
CALCIUM: 9.8 mg/dL (ref 8.9–10.3)
CO2: 30 mmol/L (ref 22–32)
Chloride: 100 mmol/L — ABNORMAL LOW (ref 101–111)
Creatinine, Ser: 1.46 mg/dL — ABNORMAL HIGH (ref 0.61–1.24)
GFR, EST AFRICAN AMERICAN: 54 mL/min — AB (ref >60)
GFR, EST NON AFRICAN AMERICAN: 46 mL/min — AB (ref >60)
Glucose, Bld: 99 mg/dL (ref 65–99)
POTASSIUM: 3.5 mmol/L (ref 3.5–5.1)
Sodium: 137 mmol/L (ref 135–145)
TOTAL PROTEIN: 6.9 g/dL (ref 6.5–8.1)

## 2016-07-31 NOTE — ED Notes (Signed)
Pt in via triage; reports losing his balance this morning in the bathroom and falling, pt reports hitting head on tile floor.  Pt denies any pain at this time.  Pt currently takes Plavix due to previous stroke; pt also with hx of subdural bleed.  Pt A/Ox4, vitals WDL, no immediate distress at this time.

## 2016-07-31 NOTE — Discharge Instructions (Signed)
Please continue follow-up with your regular doctor and your neurologist or you can [facility] neurologist here. Spoke with Dr. Thad Rangereynolds did neurologist in the hospital. She recommends continuing the blood thinners. Please be very careful not to fall. Please continue your physical therapy to work on improving her strength and coordination. Please return for any further problems.

## 2016-07-31 NOTE — ED Provider Notes (Signed)
[facility] Emergency Department Provider Note   ____________________________________________   First MD Initiated Contact with Patient 07/31/16 [phone]     (approximate)  I have reviewed the triage vital signs and the nursing notes.   HISTORY  Chief Complaint Fall and Head Injury    HPI Maurice Moses is a 70 y.o. 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 Patient seems to have lost his balance today fell backwards and hit his head. He did not pass out. Nothing to hurting him. He really can't give me more history.   Past Medical History:  Diagnosis Date  . Cerebellar stroke (HCC)   . Depression   . Hypertension   . IBS (irritable bowel syndrome)   . Stroke (cerebrum) (HCC)   . Subdural hemorrhage [facility](HCC)     Patient Active Problem List   Diagnosis Date Noted  . 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 TIA (transient ischemic attack) 07/23/2015    History reviewed. No pertinent surgical history.  Prior to Admission medications   Medication Sig Start Date End Date Taking? Authorizing Provider  aspirin EC 81 MG EC tablet Take 1 tablet (81 mg total) by mouth daily. 07/26/15   Auburn BilberryShreyang Patel, MD  atorvastatin (LIPITOR) 40 MG tablet Take 1 tablet (40 mg total) by mouth daily at 6 PM. 07/26/15   Auburn BilberryShreyang Patel, MD  azithromycin (ZITHROMAX Z-PAK) 250 MG tablet Take 2 tablets (500 mg) on  Day 1,  followed by 1 tablet (250 mg) once daily on Days 2 through 5. 02/09/16   Jennye MoccasinBrian S Quigley, MD  carbidopa-levodopa (SINEMET IR) 25-100 MG tablet Take 1 tablet by mouth 3 (three) times daily. 07/27/15   Auburn BilberryShreyang Patel, MD  cetirizine (ZYRTEC) 10 MG tablet Take 10 mg by mouth daily.    Historical Provider, MD  cholecalciferol (VITAMIN D)  1000 UNITS tablet Take 2,000 Units by mouth daily.    Historical Provider, MD  clopidogrel (PLAVIX) 75 MG tablet Take 1 tablet (75 mg total) by mouth daily. 07/26/15   Auburn BilberryShreyang Patel, MD  clorazepate (TRANXENE) 7.5 MG tablet Take 1 tablet by mouth daily.    Historical Provider, MD  docusate sodium (COLACE) 50 MG capsule Take 50 mg by mouth 2 (two) times daily.    Historical Provider, MD  escitalopram (LEXAPRO) 20 MG tablet Take 20 mg by mouth daily.    Historical Provider, MD  esomeprazole (NEXIUM) 40 MG capsule Take 40 mg by mouth daily at 12 noon.    Historical Provider, MD  hydrochlorothiazide (HYDRODIURIL) 25 MG tablet Take 25 mg by mouth daily.    Historical Provider, MD  HYDROcodone-acetaminophen (NORCO/VICODIN) 5-325 MG tablet Take 1-2 tablets by mouth every 4 (four) hours as needed for moderate pain (Max 7 per day). 07/26/15   Auburn BilberryShreyang Patel, MD  mirtazapine (REMERON SOL-TAB) 30 MG disintegrating tablet Take 1 tablet by mouth daily.    Historical Provider, MD  quinapril (ACCUPRIL) 40 MG tablet Take 40 mg by mouth at bedtime.    Historical Provider, MD    Allergies Morphine and related  Family History  Problem Relation Age of Onset  . Hypertension Other     Social History Social History  Substance Use Topics  . Smoking status: Former Games developermoker  .  Smokeless tobacco: Never Used  . Alcohol use No    Review of Systems Constitutional: No fever/chills Eyes: No visual changes. ENT: No sore throat. Cardiovascular: Denies chest pain. Respiratory: Denies shortness of breath. Gastrointestinal: No abdominal pain.  No nausea, no vomiting.  No diarrhea.  No constipation. Genitourinary: Negative for dysuria. Musculoskeletal: Negative for back pain. Skin: Negative for rash. Neurological: Negative for headaches, focal weakness or numbness.  10-point ROS otherwise negative.  ____________________________________________   PHYSICAL EXAM:  VITAL SIGNS: ED Triage Vitals [07/31/16 0941]    Enc Vitals Group     BP 129/87     Pulse Rate 75     Resp 20     Temp 97.7 F (36.5 C)     Temp Source Oral     SpO2 97 %     Weight 230 lb (104.3 kg)     Height 5\' 9"  (1.753 m)     Head Circumference      Peak Flow      Pain Score 0     Pain Loc      Pain Edu?      Excl. in GC?     Constitutional: Alert and oriented. Well appearing and in no acute distress. Eyes: Conjunctivae are normal. PERRL. EOMI. Head: Atraumatic. Nose: No congestion/rhinnorhea. Mouth/Throat: Mucous membranes are moist.  Oropharynx non-erythematous. Neck: No stridor.  No cervical spine tenderness to palpation. Cardiovascular: Normal rate, regular rhythm. Grossly normal heart sounds.  Good peripheral circulation. Respiratory: Normal respiratory effort.  No retractions. Lungs CTAB. Gastrointestinal: Soft and nontender. No distention. No abdominal bruits. No CVA tenderness. Musculoskeletal: No lower extremity tenderness nor edema.  No joint effusions. Neurologic:  Normal speech and language. No gross focal neurologic deficits are appreciated.Cerebellar finger to nose is normal heel-to-shin is normal cranial nerves II through XII are intact on the visual fields were not checked motor strength is 5 over 5 throughout. Incision is intact.    ____________________________________________   LABS (all labs ordered are listed, but only abnormal results are displayed)  Labs Reviewed  COMPREHENSIVE METABOLIC PANEL - Abnormal; Notable for the following:       Result Value   Chloride 100 (*)    BUN 22 (*)    Creatinine, Ser 1.46 (*)    GFR calc non Af Amer 46 (*)    GFR calc Af Amer 54 (*)    All other components within normal limits  TROPONIN I  CBC WITH DIFFERENTIAL/PLATELET   ____________________________________________  EKG  EKG read and interpreted by me shows normal sinus rhythm rate of 69 left axis low voltage nonspecific ST-T wave  changes ____________________________________________  RADIOLOGY  Study Result   CLINICAL DATA:  Fall today.  Left side injury.  EXAM: CT HEAD WITHOUT CONTRAST  TECHNIQUE: Contiguous axial images were obtained from the base of the skull through the vertex without intravenous contrast.  COMPARISON:  10/13/2015  FINDINGS: Brain: Old left cerebellar infarct. There is atrophy and chronic small vessel disease changes. No acute intracranial abnormality. Specifically, no hemorrhage, hydrocephalus, mass lesion, acute infarction, or significant intracranial injury.  Vascular: No hyperdense vessel or unexpected calcification.  Skull: No acute calvarial abnormality.  Sinuses/Orbits: No acute findings  Other: None  IMPRESSION: Atrophy, chronic small vessel disease.  Old left cerebellar infarct.  No acute intracranial abnormality.   Electronically Signed   By: Charlett Nose M.D.   On: 07/31/2016 10:24     ____________________________________________   PROCEDURES  Procedure(s) performed  Procedures  Critical  Care performed:  ____________________________________________   INITIAL IMPRESSION / ASSESSMENT AND PLAN / ED COURSE  Pertinent labs & imaging results that were available during my care of the patient were reviewed by me and considered in my medical decision making (see chart for details).    Clinical Course    Discussed the patient Dr. Thad Rangereynolds on call for neurology supplied the history given to me by the patient's wife. She recommends keeping the patient on his blood thinners.  ____________________________________________   FINAL CLINICAL IMPRESSION(S) / ED DIAGNOSES  Final diagnoses:  Fall, initial encounter      NEW MEDICATIONS STARTED DURING THIS VISIT:  New Prescriptions   No medications on file     Note:  This document was prepared using Dragon voice recognition software and may include unintentional dictation  errors.    Arnaldo NatalPaul F Mauria Asquith, MD 07/31/16 [phone]

## 2016-07-31 NOTE — ED Triage Notes (Signed)
Pt to ed with c/o fall this am while walking into bathroom,  Pt states he fell backwards onto hard tile and hit left side of head.  Pt confused to events at this time.  Pt denies loss of consciousness however pt on plavix at this time.  Pt with hx of subdural bleed 10/13/15 and cerebellar stroke 07/23/15. Pt also with noted abrasion to bilat elbows.

## 2016-11-25 ENCOUNTER — Observation Stay
Admission: EM | Admit: 2016-11-25 | Discharge: 2016-11-26 | Disposition: A | Discharge status: Home / Self Care | Payer: Medicare Other | Attending: Internal Medicine | Admitting: Internal Medicine

## 2016-11-25 DIAGNOSIS — Z79899 Other long term (current) drug therapy: Secondary | ICD-10-CM | POA: Diagnosis not present

## 2016-11-25 DIAGNOSIS — Z7982 Long term (current) use of aspirin: Secondary | ICD-10-CM | POA: Diagnosis not present

## 2016-11-25 DIAGNOSIS — Z8673 Personal history of transient ischemic attack (TIA), and cerebral infarction without residual deficits: Secondary | ICD-10-CM | POA: Diagnosis not present

## 2016-11-25 DIAGNOSIS — Z885 Allergy status to narcotic agent status: Secondary | ICD-10-CM | POA: Insufficient documentation

## 2016-11-25 DIAGNOSIS — Z820 Family history of epilepsy and other diseases of the nervous system: Secondary | ICD-10-CM | POA: Insufficient documentation

## 2016-11-25 DIAGNOSIS — Z881 Allergy status to other antibiotic agents status: Secondary | ICD-10-CM | POA: Insufficient documentation

## 2016-11-25 DIAGNOSIS — E876 Hypokalemia: Secondary | ICD-10-CM | POA: Diagnosis not present

## 2016-11-25 DIAGNOSIS — E785 Hyperlipidemia, unspecified: Secondary | ICD-10-CM | POA: Insufficient documentation

## 2016-11-25 DIAGNOSIS — I10 Essential (primary) hypertension: Secondary | ICD-10-CM | POA: Diagnosis not present

## 2016-11-25 DIAGNOSIS — R253 Fasciculation: Secondary | ICD-10-CM | POA: Insufficient documentation

## 2016-11-25 DIAGNOSIS — K589 Irritable bowel syndrome without diarrhea: Secondary | ICD-10-CM | POA: Insufficient documentation

## 2016-11-25 DIAGNOSIS — R4182 Altered mental status, unspecified: Principal | ICD-10-CM | POA: Insufficient documentation

## 2016-11-25 DIAGNOSIS — Z7902 Long term (current) use of antithrombotics/antiplatelets: Secondary | ICD-10-CM | POA: Diagnosis not present

## 2016-11-25 DIAGNOSIS — Z87891 Personal history of nicotine dependence: Secondary | ICD-10-CM | POA: Diagnosis not present

## 2016-11-25 DIAGNOSIS — D72829 Elevated white blood cell count, unspecified: Secondary | ICD-10-CM | POA: Diagnosis not present

## 2016-11-25 DIAGNOSIS — I639 Cerebral infarction, unspecified: Secondary | ICD-10-CM | POA: Diagnosis present

## 2016-11-25 DIAGNOSIS — R531 Weakness: Secondary | ICD-10-CM | POA: Insufficient documentation

## 2016-11-25 DIAGNOSIS — F329 Major depressive disorder, single episode, unspecified: Secondary | ICD-10-CM | POA: Insufficient documentation

## 2016-11-26 ENCOUNTER — Emergency Department: Payer: Medicare Other

## 2016-11-26 ENCOUNTER — Observation Stay
Admit: 2016-11-26 | Discharge: 2016-11-26 | Disposition: A | Discharge status: Home / Self Care | Payer: Medicare Other | Attending: Internal Medicine | Admitting: Internal Medicine

## 2016-11-26 ENCOUNTER — Observation Stay: Payer: Medicare Other

## 2016-11-26 DIAGNOSIS — R4182 Altered mental status, unspecified: Secondary | ICD-10-CM

## 2016-11-26 DIAGNOSIS — I639 Cerebral infarction, unspecified: Secondary | ICD-10-CM | POA: Diagnosis present

## 2016-11-26 LAB — COMPREHENSIVE METABOLIC PANEL
ALK PHOS: 89 U/L (ref 38–126)
ALT: 36 U/L (ref 17–63)
AST: 39 U/L (ref 15–41)
Albumin: 4 g/dL (ref 3.5–5.0)
Anion gap: 7 (ref 5–15)
BILIRUBIN TOTAL: 0.9 mg/dL (ref 0.3–1.2)
BUN: 14 mg/dL (ref 6–20)
CALCIUM: 9.8 mg/dL (ref 8.9–10.3)
CO2: 29 mmol/L (ref 22–32)
Chloride: 99 mmol/L — ABNORMAL LOW (ref 101–111)
Creatinine, Ser: 1.24 mg/dL (ref 0.61–1.24)
GFR calc Af Amer: >60 mL/min (ref >60)
GFR calc non Af Amer: 56 mL/min — ABNORMAL LOW (ref >60)
GLUCOSE: 142 mg/dL — AB (ref 65–99)
Potassium: 3.2 mmol/L — ABNORMAL LOW (ref 3.5–5.1)
SODIUM: 135 mmol/L (ref 135–145)
TOTAL PROTEIN: 7 g/dL (ref 6.5–8.1)

## 2016-11-26 LAB — URINALYSIS, COMPLETE (UACMP) WITH MICROSCOPIC
BACTERIA UA: NONE SEEN
BILIRUBIN URINE: NEGATIVE
Glucose, UA: NEGATIVE mg/dL
Hgb urine dipstick: NEGATIVE
Ketones, ur: NEGATIVE mg/dL
LEUKOCYTES UA: NEGATIVE
NITRITE: NEGATIVE
PROTEIN: NEGATIVE mg/dL
Specific Gravity, Urine: 1.011 (ref 1.005–1.030)
pH: 7 (ref 5.0–8.0)

## 2016-11-26 LAB — MAGNESIUM: Magnesium: 2 mg/dL (ref 1.7–2.4)

## 2016-11-26 LAB — INFLUENZA PANEL BY PCR (TYPE A & B)
Influenza A By PCR: NEGATIVE
Influenza B By PCR: NEGATIVE

## 2016-11-26 LAB — CBC
HEMATOCRIT: 45.8 % (ref 40.0–52.0)
HEMOGLOBIN: 15.6 g/dL (ref 13.0–18.0)
MCH: 31 pg (ref 26.0–34.0)
MCHC: 34.1 g/dL (ref 32.0–36.0)
MCV: 90.9 fL (ref 80.0–100.0)
Platelets: 278 10*3/uL (ref 150–440)
RBC: 5.04 MIL/uL (ref 4.40–5.90)
RDW: 13.4 % (ref 11.5–14.5)
WBC: 15.8 10*3/uL — ABNORMAL HIGH (ref 3.8–10.6)

## 2016-11-26 LAB — ECHOCARDIOGRAM COMPLETE
Height: 71 in
Weight: 4000 oz

## 2016-11-26 LAB — LIPID PANEL
Cholesterol: 139 mg/dL (ref 0–200)
HDL: 44 mg/dL (ref >40)
LDL Cholesterol: 70 mg/dL (ref 0–99)
Total CHOL/HDL Ratio: 3.2 RATIO
Triglycerides: 126 mg/dL (ref <150)
VLDL: 25 mg/dL (ref 0–40)

## 2016-11-26 LAB — LACTIC ACID, PLASMA
LACTIC ACID, VENOUS: 2 mmol/L — AB (ref 0.5–1.9)
Lactic Acid, Venous: 1.6 mmol/L (ref 0.5–1.9)

## 2016-11-26 LAB — TROPONIN I: Troponin I: <0.03 ng/mL (ref <0.03)

## 2016-11-26 MED ORDER — LISINOPRIL 20 MG PO TABS
40.0000 mg | ORAL_TABLET | Freq: Every day | ORAL | Status: DC
  Filled 2016-11-26: qty 2

## 2016-11-26 MED ORDER — LORATADINE 10 MG PO TABS
10.0000 mg | ORAL_TABLET | Freq: Every day | ORAL | Status: DC
  Administered 2016-11-26: 10:00:00 10 mg via ORAL
  Filled 2016-11-26: qty 1

## 2016-11-26 MED ORDER — ASPIRIN EC 81 MG PO TBEC
81.0000 mg | DELAYED_RELEASE_TABLET | Freq: Every day | ORAL | Status: DC
  Administered 2016-11-26: 10:00:00 81 mg via ORAL
  Filled 2016-11-26: qty 1

## 2016-11-26 MED ORDER — CLORAZEPATE DIPOTASSIUM 3.75 MG PO TABS: 3.7500 mg | ORAL_TABLET | Freq: Every evening | ORAL | Status: DC | PRN

## 2016-11-26 MED ORDER — PANTOPRAZOLE SODIUM 40 MG PO TBEC
40.0000 mg | DELAYED_RELEASE_TABLET | Freq: Every day | ORAL | Status: DC
  Filled 2016-11-26: qty 1

## 2016-11-26 MED ORDER — ACETAMINOPHEN 160 MG/5ML PO SOLN: 650.0000 mg | ORAL | Status: DC | PRN

## 2016-11-26 MED ORDER — QUINAPRIL HCL 10 MG PO TABS: 40.0000 mg | ORAL_TABLET | Freq: Every day | ORAL | Status: DC

## 2016-11-26 MED ORDER — SODIUM CHLORIDE 0.9 % IV SOLN
INTRAVENOUS | Status: DC
  Administered 2016-11-26: 09:00:00 via INTRAVENOUS

## 2016-11-26 MED ORDER — ESCITALOPRAM OXALATE 10 MG PO TABS
20.0000 mg | ORAL_TABLET | Freq: Every day | ORAL | Status: DC
  Filled 2016-11-26: qty 2

## 2016-11-26 MED ORDER — ACETAMINOPHEN 650 MG RE SUPP: 650.0000 mg | RECTAL | Status: DC | PRN

## 2016-11-26 MED ORDER — POTASSIUM CHLORIDE CRYS ER 20 MEQ PO TBCR
40.0000 meq | EXTENDED_RELEASE_TABLET | Freq: Once | ORAL | Status: AC
  Administered 2016-11-26: 08:00:00 40 meq via ORAL
  Filled 2016-11-26: qty 2

## 2016-11-26 MED ORDER — HYDROCHLOROTHIAZIDE 25 MG PO TABS
25.0000 mg | ORAL_TABLET | Freq: Every day | ORAL | Status: DC
  Administered 2016-11-26: 25 mg via ORAL
  Filled 2016-11-26: qty 1

## 2016-11-26 MED ORDER — ENOXAPARIN SODIUM 40 MG/0.4ML ~~LOC~~ SOLN: 40.0000 mg | SUBCUTANEOUS | Status: DC

## 2016-11-26 MED ORDER — ATORVASTATIN CALCIUM 20 MG PO TABS
40.0000 mg | ORAL_TABLET | Freq: Every day | ORAL | Status: DC
  Administered 2016-11-26: 40 mg via ORAL
  Filled 2016-11-26: qty 2

## 2016-11-26 MED ORDER — MIRTAZAPINE 15 MG PO TBDP
30.0000 mg | ORAL_TABLET | Freq: Every day | ORAL | Status: DC
  Filled 2016-11-26: qty 2

## 2016-11-26 MED ORDER — DOCUSATE SODIUM 100 MG PO CAPS
100.0000 mg | ORAL_CAPSULE | Freq: Three times a day (TID) | ORAL | Status: DC
  Administered 2016-11-26 (×2): 100 mg via ORAL
  Filled 2016-11-26 (×2): qty 1

## 2016-11-26 MED ORDER — ACETAMINOPHEN 325 MG PO TABS: 650.0000 mg | ORAL_TABLET | ORAL | Status: DC | PRN

## 2016-11-26 MED ORDER — CLOPIDOGREL BISULFATE 75 MG PO TABS
75.0000 mg | ORAL_TABLET | Freq: Every day | ORAL | Status: DC
  Administered 2016-11-26: 75 mg via ORAL
  Filled 2016-11-26: qty 1

## 2016-11-26 MED ORDER — HYDROCODONE-ACETAMINOPHEN 5-325 MG PO TABS: 1.0000 | ORAL_TABLET | Freq: Four times a day (QID) | ORAL | Status: DC | PRN

## 2016-11-26 MED ORDER — STROKE: EARLY STAGES OF RECOVERY BOOK
Freq: Once | Status: AC
  Administered 2016-11-26: 08:00:00

## 2016-11-26 NOTE — Discharge Summary (Signed)
[facility] [place] at [facility]   PATIENT NAME: Maurice Moses    MR#:  [id number]  DATE OF BIRTH:  February 11, 1946  DATE OF ADMISSION:  11/25/2016   ADMITTING PHYSICIAN: Arnaldo Natal, MD  DATE OF DISCHARGE:  11/26/2016 PRIMARY CARE PHYSICIAN: Linfors, Leane Call, MD   ADMISSION DIAGNOSIS:  Altered mental status, unspecified altered mental status type [R41.82] DISCHARGE DIAGNOSIS:  Active Problems:   CVA (cerebral vascular accident) (HCC) Weakness. SECONDARY DIAGNOSIS:   Past Medical History:  Diagnosis Date  . Cerebellar stroke (HCC)   . Depression   . Hypertension   . IBS (irritable bowel syndrome)   . Stroke (cerebrum) (HCC)   . Subdural hemorrhage [facility])    HOSPITAL COURSE:   This is a 71 year old male admitted for weakness. 1. Weakness:   CT shows old left cerebellar stroke. MRI of the brain didn't show any CVA. Carotid duplex is unremarkable. Echocardiogram is unremarkable. The patient refused PT and OT evaluation. Continue aspirin and Plavix.  Per neurologist, the patient may be discharged today.  2. Parkinsonian features: involuntary fasciculations and difficulty initiating movement at times.  Strong family history of parkinson's disease.  Mental slowing may be secondary to this as well.  3. Leukocytosis: differential includes viral syndrome although CBC differential is more consistent with bacterial infection.  Lungs and urine clear. Screen for influenza is negative. Follow-up CBC with PCP.  4. Hypertension: Uncontrolled; continue hydrochlorothiazide and lisinopril. Labetalol as needed. 5. Hyperlipidemia: Continue statin therapy 6. Depression: Continue Remeron and Lexapro Hypokalemia. Given potassium in the follow-up BMP with PCP as outpatient.  DISCHARGE CONDITIONS:  Stable, discharge to home today. CONSULTS OBTAINED:  Treatment Team:  Pauletta Browns, MD DRUG ALLERGIES:   Allergies  Allergen Reactions  . Doxycycline Nausea And  Vomiting  . Morphine And Related Other (See Comments)    Agitation - wife does not want him to have it   DISCHARGE MEDICATIONS:   Allergies as of 11/26/2016      Reactions   Doxycycline Nausea And Vomiting   Morphine And Related Other (See Comments)   Agitation - wife does not want him to have it      Medication List    TAKE these medications   aspirin 81 MG EC tablet Take 1 tablet (81 mg total) by mouth daily.   atorvastatin 40 MG tablet Commonly known as:  LIPITOR Take 1 tablet (40 mg total) by mouth daily at 6 PM.   cetirizine 10 MG tablet Commonly known as:  ZYRTEC Take 10 mg by mouth daily.   clopidogrel 75 MG tablet Commonly known as:  PLAVIX Take 1 tablet (75 mg total) by mouth daily.   clorazepate 7.5 MG tablet Commonly known as:  TRANXENE Take 1 tablet by mouth daily as needed for anxiety.   docusate sodium 100 MG capsule Commonly known as:  COLACE Take 100 mg by mouth 3 (three) times daily.   escitalopram 20 MG tablet Commonly known as:  LEXAPRO Take 20 mg by mouth daily.   esomeprazole 40 MG capsule Commonly known as:  NEXIUM Take 40 mg by mouth daily at 12 noon.   hydrochlorothiazide 25 MG tablet Commonly known as:  HYDRODIURIL Take 25 mg by mouth daily.   HYDROcodone-acetaminophen 5-325 MG tablet Commonly known as:  NORCO/VICODIN Take 1-2 tablets by mouth every 4 (four) hours as needed for moderate pain (Max 7 per day).   mirtazapine 30 MG disintegrating tablet Commonly known as:  REMERON SOL-TAB Take 1  tablet by mouth daily.   quinapril 40 MG tablet Commonly known as:  ACCUPRIL Take 40 mg by mouth at bedtime.   VESICARE 10 MG tablet Generic drug:  solifenacin Take 10 mg by mouth daily.        DISCHARGE INSTRUCTIONS:  See AVS.  If you experience worsening of your admission symptoms, develop shortness of breath, life threatening emergency, suicidal or homicidal thoughts you must seek medical attention immediately by calling 911 or  calling your MD immediately  if symptoms less severe.  You Must read complete instructions/literature along with all the possible adverse reactions/side effects for all the Medicines you take and that have been prescribed to you. Take any new Medicines after you have completely understood and accpet all the possible adverse reactions/side effects.   Please note  You were cared for by a hospitalist during your hospital stay. If you have any questions about your discharge medications or the care you received while you were in the hospital after you are discharged, you can call the unit and asked to speak with the hospitalist on call if the hospitalist that took care of you is not available. Once you are discharged, your primary care physician will handle any further medical issues. Please note that NO REFILLS for any discharge medications will be authorized once you are discharged, as it is imperative that you return to your primary care physician (or establish a relationship with a primary care physician if you do not have one) for your aftercare needs so that they can reassess your need for medications and monitor your lab values.    On the day of Discharge:  VITAL SIGNS:  Blood pressure (!) 169/97, pulse 88, temperature 98.6 F (37 C), temperature source Oral, resp. rate 20, height 5\' 11"  (1.803 m), weight 250 lb (113.4 kg), SpO2 97 %. PHYSICAL EXAMINATION:  GENERAL:  71 y.o.-year-old patient lying in the bed with no acute distress.  EYES: Pupils equal, round, reactive to light and accommodation. No scleral icterus. Extraocular muscles intact.  HEENT: Head atraumatic, normocephalic. Oropharynx and nasopharynx clear.  NECK:  Supple, no jugular venous distention. No thyroid enlargement, no tenderness.  LUNGS: Normal breath sounds bilaterally, no wheezing, rales,rhonchi or crepitation. No use of accessory muscles of respiration.  CARDIOVASCULAR: S1, S2 normal. No murmurs, rubs, or gallops.    ABDOMEN: Soft, non-tender, non-distended. Bowel sounds present. No organomegaly or mass.  EXTREMITIES: No pedal edema, cyanosis, or clubbing.  NEUROLOGIC: Cranial nerves II through XII are intact. Muscle strength 5/5 in all extremities. Sensation intact. Gait not checked.  PSYCHIATRIC: The patient is alert and oriented x 3.  SKIN: No obvious rash, lesion, or ulcer.  DATA REVIEW:   CBC  Recent Labs Lab 11/26/16 0011  WBC 15.8*  HGB 15.6  HCT 45.8  PLT 278    Chemistries   Recent Labs Lab 11/26/16 0011 11/26/16 0519  NA 135  --   K 3.2*  --   CL 99*  --   CO2 29  --   GLUCOSE 142*  --   BUN 14  --   CREATININE 1.24  --   CALCIUM 9.8  --   MG  --  2.0  AST 39  --   ALT 36  --   ALKPHOS 89  --   BILITOT 0.9  --      Microbiology Results  Results for orders placed or performed during the hospital encounter of 11/25/16  Culture, blood (routine x 2)  Status: None (Preliminary result)   Collection Time: 11/26/16 12:17 AM  Result Value Ref Range Status   Specimen Description BLOOD RIGHT WRIST  Final   Special Requests   Final    BOTTLES DRAWN AEROBIC AND ANAEROBIC Blood Culture results may not be optimal due to an excessive volume of blood received in culture bottles   Culture NO GROWTH < 12 HOURS  Final   Report Status PENDING  Incomplete  Culture, blood (routine x 2)     Status: None (Preliminary result)   Collection Time: 11/26/16 12:54 AM  Result Value Ref Range Status   Specimen Description BLOOD BLOOD RIGHT HAND  Final   Special Requests   Final    BOTTLES DRAWN AEROBIC AND ANAEROBIC Blood Culture adequate volume   Culture NO GROWTH < 12 HOURS  Final   Report Status PENDING  Incomplete    RADIOLOGY:  Ct Head Wo Contrast  Result Date: 11/26/2016 CLINICAL DATA:  71 y/o  M; altered mental status. EXAM: CT HEAD WITHOUT CONTRAST TECHNIQUE: Contiguous axial images were obtained from the base of the skull through the vertex without intravenous contrast.  COMPARISON:  10/02/2015 CT of the head. FINDINGS: Brain: No evidence of acute infarction, hemorrhage, hydrocephalus, extra-axial collection or mass lesion/mass effect. Stable left chronic cerebellar infarction. Stable mild chronic microvascular ischemic changes of white matter and brain parenchymal volume loss. Vascular: Mild calcific atherosclerosis of the cavernous and paraclinoid internal carotid arteries. Skull: Normal. Negative for fracture or focal lesion. Sinuses/Orbits: Patchy opacification of ethmoid air cells. Otherwise visualized paranasal sinuses and mastoid air cells are normally aerated. Other: None. IMPRESSION: 1. No acute intracranial abnormality identified. 2. Stable mild for age chronic microvascular ischemic changes and mild parenchymal volume loss of the brain. 3. Stable small chronic infarct in left cerebellar hemisphere. Electronically Signed   By: Mitzi Hansen M.D.   On: 11/26/2016 00:46   Mr Brain Wo Contrast  Result Date: 11/26/2016 CLINICAL DATA:  Weakness. EXAM: MRI HEAD WITHOUT CONTRAST MRA HEAD WITHOUT CONTRAST TECHNIQUE: Multiplanar, multiecho pulse sequences of the brain and surrounding structures were obtained without intravenous contrast. Angiographic images of the head were obtained using MRA technique without contrast. COMPARISON:  Head CT 11/26/2016. Head MRI/ MRA 07/24/2015. Head CTA 07/25/2015. FINDINGS: MRI HEAD FINDINGS Brain: There is no evidence of acute infarct, intracranial hemorrhage, mass, midline shift, or extra-axial fluid collection. There is moderate cerebral atrophy. Chronic infarcts are again seen involving the left greater than right cerebellum. Minimal cerebral white matter T2 signal changes are not greater than expected for patient's age. Vascular: Major intracranial vascular flow voids are preserved. Skull and upper cervical spine: Unremarkable bone marrow signal. Sinuses/Orbits: Unremarkable orbits. Minimal mucosal thickening in the ethmoid  sinuses and right mastoid air cells. Other: None. MRA HEAD FINDINGS The visualized distal vertebral arteries are widely patent to the basilar. Patent AICA and SCA origins are visualized bilaterally. The basilar artery is widely patent. There is a patent right posterior communicating artery. PCAs are patent with severe proximal P2 stenoses again noted on the left. There are bilateral distal cervical ICA loops. The intracranial ICAs are widely patent from skullbase to carotid termini. ACAs and MCAs are patent without evidence of major branch occlusion or significant proximal stenosis. No intracranial aneurysm is identified. IMPRESSION: 1. No acute intracranial abnormality. 2. Chronic cerebellar infarcts. 3. Unchanged severe left P2 PCA stenoses. No large vessel occlusion. Electronically Signed   By: Sebastian Ache M.D.   On: 11/26/2016 12:24   US  Carotid Bilateral (at [facility] Only)  Result Date: 11/26/2016 CLINICAL DATA:  CVA EXAM: BILATERAL CAROTID DUPLEX ULTRASOUND TECHNIQUE: Wallace Cullens scale imaging, color Doppler and duplex ultrasound were performed of bilateral carotid and vertebral arteries in the neck. COMPARISON:  None. FINDINGS: Criteria: Quantification of carotid stenosis is based on velocity parameters that correlate the residual internal carotid diameter with NASCET-based stenosis levels, using the diameter of the distal internal carotid lumen as the denominator for stenosis measurement. The following velocity measurements were obtained: RIGHT ICA:  94 cm/sec CCA:  108 cm/sec SYSTOLIC ICA/CCA RATIO:  0.9 DIASTOLIC ICA/CCA RATIO:  1.5 ECA:  103 cm/sec LEFT ICA:  83 cm/sec CCA:  147 cm/sec SYSTOLIC ICA/CCA RATIO:  0.6 DIASTOLIC ICA/CCA RATIO:  1.0 ECA:  113 cm/sec RIGHT CAROTID ARTERY: Mild calcified plaque in the bulb. Low resistance internal carotid Doppler pattern. RIGHT VERTEBRAL ARTERY:  Antegrade. LEFT CAROTID ARTERY: Moderate calcified plaque in the bulb. Low resistance internal carotid Doppler  pattern. LEFT VERTEBRAL ARTERY:  Antegrade. IMPRESSION: Less than 50% stenosis in the right and left internal carotid arteries. Electronically Signed   By: Jolaine Click M.D.   On: 11/26/2016 13:32   Dg Chest Port 1 View  Result Date: 11/26/2016 CLINICAL DATA:  Altered mental status EXAM: PORTABLE CHEST 1 VIEW COMPARISON:  02/09/2016 FINDINGS: The heart size and mediastinal contours are within normal limits. Aortic atherosclerosis is again noted without aneurysm involving the arch. Loop recorder device is seen projecting over the left lung base. Both lungs are clear. The visualized skeletal structures are unremarkable. IMPRESSION: Aortic atherosclerosis.  No acute cardiopulmonary disease. Electronically Signed   By: Tollie Eth M.D.   On: 11/26/2016 00:56   Mr Maxine Glenn Head/brain ZO Cm  Result Date: 11/26/2016 CLINICAL DATA:  Weakness. EXAM: MRI HEAD WITHOUT CONTRAST MRA HEAD WITHOUT CONTRAST TECHNIQUE: Multiplanar, multiecho pulse sequences of the brain and surrounding structures were obtained without intravenous contrast. Angiographic images of the head were obtained using MRA technique without contrast. COMPARISON:  Head CT 11/26/2016. Head MRI/ MRA 07/24/2015. Head CTA 07/25/2015. FINDINGS: MRI HEAD FINDINGS Brain: There is no evidence of acute infarct, intracranial hemorrhage, mass, midline shift, or extra-axial fluid collection. There is moderate cerebral atrophy. Chronic infarcts are again seen involving the left greater than right cerebellum. Minimal cerebral white matter T2 signal changes are not greater than expected for patient's age. Vascular: Major intracranial vascular flow voids are preserved. Skull and upper cervical spine: Unremarkable bone marrow signal. Sinuses/Orbits: Unremarkable orbits. Minimal mucosal thickening in the ethmoid sinuses and right mastoid air cells. Other: None. MRA HEAD FINDINGS The visualized distal vertebral arteries are widely patent to the basilar. Patent AICA and SCA  origins are visualized bilaterally. The basilar artery is widely patent. There is a patent right posterior communicating artery. PCAs are patent with severe proximal P2 stenoses again noted on the left. There are bilateral distal cervical ICA loops. The intracranial ICAs are widely patent from skullbase to carotid termini. ACAs and MCAs are patent without evidence of major branch occlusion or significant proximal stenosis. No intracranial aneurysm is identified. IMPRESSION: 1. No acute intracranial abnormality. 2. Chronic cerebellar infarcts. 3. Unchanged severe left P2 PCA stenoses. No large vessel occlusion. Electronically Signed   By: Sebastian Ache M.D.   On: 11/26/2016 12:24     Management plans discussed with the patient, his wife and they are in agreement.  CODE STATUS: Full Code   TOTAL TIME TAKING CARE OF THIS PATIENT: 26 minutes.    Imogene Burn,  Claudina Oliphant M.D on 11/26/2016 at 5:29 PM  Between 7am to 6pm - Pager - [phone]  After 6pm go to [email or web address] - Social research officer, government  [facility]  Office  [phone]  CC: Primary care physician; Linfors, Leane Call, MD   Note: This dictation was prepared with Dragon dictation along with smaller phrase technology. Any transcriptional errors that result from this process are unintentional.

## 2016-11-26 NOTE — H&P (Signed)
Maurice Moses is an 71 y.o. male.   Chief Complaint: Weakness HPI: The patient with past medical history of cerebrovascular accident, falls with subsequent subdural hematoma and hypertension presents emergency department complaining of weakness. The patient began to heal though this morning. His general malaise evolved into chills. He called his wife home to help him to the restroom because he was too weak to ambulate. His wife states that she was barely able to carry him and felt as if his legs were weak. At some point she expressed concern that his right leg may be weaker than his left however she does not endorse this to me. Following his stroke 2 years ago he was known to have some dysmetria on the left but no motor deficits. Today the patient denies focal weakness, sensory abdomen mildly or difficulty swallowing. He also denies dizziness, imbalance or vertigo. Laboratory evaluation is significant for leukocytosis and there is a report of fever at home although the patient is afebrile here without having taken Tylenol. CT of his head shows his old cerebellar stroke but no new insults. Due to his unclear symptoms and history of CVA emergency department staff called the hospitalist service for further evaluation.  Past Medical History:  Diagnosis Date  . Cerebellar stroke ([place])   . Depression   . Hypertension   . IBS (irritable bowel syndrome)   . Stroke (cerebrum) ([place])   . Subdural hemorrhage [facility])     Past Surgical History:  Procedure Laterality Date  . LOOP RECORDER INSERTION       Family History  Problem Relation Age of Onset  . Hypertension Other    Social History:  reports that he has quit smoking. He has never used smokeless tobacco. He reports that he does not drink alcohol or use drugs.  Allergies:  Allergies  Allergen Reactions  . Doxycycline Nausea And Vomiting  . Morphine And Related Other (See Comments)    Agitation - wife does not want him to have it    Medications  Prior to Admission  Medication Sig Dispense Refill  . aspirin EC 81 MG EC tablet Take 1 tablet (81 mg total) by mouth daily.    Marland Kitchen atorvastatin (LIPITOR) 40 MG tablet Take 1 tablet (40 mg total) by mouth daily at 6 PM. 40 tablet 0  . cetirizine (ZYRTEC) 10 MG tablet Take 10 mg by mouth daily.    . clopidogrel (PLAVIX) 75 MG tablet Take 1 tablet (75 mg total) by mouth daily.  0  . clorazepate (TRANXENE) 7.5 MG tablet Take 1 tablet by mouth daily as needed for anxiety.     . docusate sodium (COLACE) 100 MG capsule Take 100 mg by mouth 3 (three) times daily.     Marland Kitchen escitalopram (LEXAPRO) 20 MG tablet Take 20 mg by mouth daily.    Marland Kitchen esomeprazole (NEXIUM) 40 MG capsule Take 40 mg by mouth daily at 12 noon.    . hydrochlorothiazide (HYDRODIURIL) 25 MG tablet Take 25 mg by mouth daily.    Marland Kitchen HYDROcodone-acetaminophen (NORCO/VICODIN) 5-325 MG tablet Take 1-2 tablets by mouth every 4 (four) hours as needed for moderate pain (Max 7 per day). 30 tablet 0  . mirtazapine (REMERON SOL-TAB) 30 MG disintegrating tablet Take 1 tablet by mouth daily.    . quinapril (ACCUPRIL) 40 MG tablet Take 40 mg by mouth at bedtime.    . VESICARE 10 MG tablet Take 10 mg by mouth daily.  1    Results for orders placed or  performed during the hospital encounter of 11/25/16 (from the past 48 hour(s))  Comprehensive metabolic panel     Status: Abnormal   Collection Time: 11/26/16 12:11 AM  Result Value Ref Range   Sodium 135 135 - 145 mmol/L   Potassium 3.2 (L) 3.5 - 5.1 mmol/L   Chloride 99 (L) 101 - 111 mmol/L   CO2 29 22 - 32 mmol/L   Glucose, Bld 142 (H) 65 - 99 mg/dL   BUN 14 6 - 20 mg/dL   Creatinine, Ser 1.24 0.61 - 1.24 mg/dL   Calcium 9.8 8.9 - 10.3 mg/dL   Total Protein 7.0 6.5 - 8.1 g/dL   Albumin 4.0 3.5 - 5.0 g/dL   AST 39 15 - 41 U/L   ALT 36 17 - 63 U/L   Alkaline Phosphatase 89 38 - 126 U/L   Total Bilirubin 0.9 0.3 - 1.2 mg/dL   GFR calc non Af Amer 56 (L) >60 mL/min   GFR calc Af Amer >60 >60 mL/min     Comment: (NOTE) The eGFR has been calculated using the CKD EPI equation. This calculation has not been validated in all clinical situations. eGFR's persistently <60 mL/min signify possible Chronic Kidney Disease.    Anion gap 7 5 - 15  CBC     Status: Abnormal   Collection Time: 11/26/16 12:11 AM  Result Value Ref Range   WBC 15.8 (H) 3.8 - 10.6 K/uL   RBC 5.04 4.40 - 5.90 MIL/uL   Hemoglobin 15.6 13.0 - 18.0 g/dL   HCT 45.8 40.0 - 52.0 %   MCV 90.9 80.0 - 100.0 fL   MCH 31.0 26.0 - 34.0 pg   MCHC 34.1 32.0 - 36.0 g/dL   RDW 13.4 11.5 - 14.5 %   Platelets 278 150 - 440 K/uL  Troponin I     Status: None   Collection Time: 11/26/16 12:11 AM  Result Value Ref Range   Troponin I <0.03 <0.03 ng/mL  Lactic acid, plasma     Status: Abnormal   Collection Time: 11/26/16 12:54 AM  Result Value Ref Range   Lactic Acid, Venous 2.0 (HH) 0.5 - 1.9 mmol/L    Comment: CRITICAL RESULT CALLED TO, READ BACK BY AND VERIFIED WITH IRIS GUIDRY AT 0128 11/26/16.PMH  Urinalysis, Complete w Microscopic     Status: Abnormal   Collection Time: 11/26/16  1:25 AM  Result Value Ref Range   Color, Urine YELLOW (A) YELLOW   APPearance CLEAR (A) CLEAR   Specific Gravity, Urine 1.011 1.005 - 1.030   pH 7.0 5.0 - 8.0   Glucose, UA NEGATIVE NEGATIVE mg/dL   Hgb urine dipstick NEGATIVE NEGATIVE   Bilirubin Urine NEGATIVE NEGATIVE   Ketones, ur NEGATIVE NEGATIVE mg/dL   Protein, ur NEGATIVE NEGATIVE mg/dL   Nitrite NEGATIVE NEGATIVE   Leukocytes, UA NEGATIVE NEGATIVE   RBC / HPF 0-5 0 - 5 RBC/hpf   WBC, UA 0-5 0 - 5 WBC/hpf   Bacteria, UA NONE SEEN NONE SEEN   Squamous Epithelial / LPF 0-5 (A) NONE SEEN   Mucous PRESENT   Lactic acid, plasma     Status: None   Collection Time: 11/26/16  4:03 AM  Result Value Ref Range   Lactic Acid, Venous 1.6 0.5 - 1.9 mmol/L  Lipid panel     Status: None   Collection Time: 11/26/16  5:19 AM  Result Value Ref Range   Cholesterol 139 0 - 200 mg/dL   Triglycerides  126 <150  mg/dL   HDL 44 >40 mg/dL   Total CHOL/HDL Ratio 3.2 RATIO   VLDL 25 0 - 40 mg/dL   LDL Cholesterol 70 0 - 99 mg/dL    Comment:        Total Cholesterol/HDL:CHD Risk Coronary Heart Disease Risk Table                     Men   Women  1/2 Average Risk   3.4   3.3  Average Risk       5.0   4.4  2 X Average Risk   9.6   7.1  3 X Average Risk  23.4   11.0        Use the calculated Patient Ratio above and the CHD Risk Table to determine the patient's CHD Risk.        ATP III CLASSIFICATION (LDL):  <100     mg/dL   Optimal  100-129  mg/dL   Near or Above                    Optimal  130-159  mg/dL   Borderline  160-189  mg/dL   High  >190     mg/dL   Very High    Ct Head Wo Contrast  Result Date: 11/26/2016 CLINICAL DATA:  71 y/o  M; altered mental status. EXAM: CT HEAD WITHOUT CONTRAST TECHNIQUE: Contiguous axial images were obtained from the base of the skull through the vertex without intravenous contrast. COMPARISON:  10/02/2015 CT of the head. FINDINGS: Brain: No evidence of acute infarction, hemorrhage, hydrocephalus, extra-axial collection or mass lesion/mass effect. Stable left chronic cerebellar infarction. Stable mild chronic microvascular ischemic changes of white matter and brain parenchymal volume loss. Vascular: Mild calcific atherosclerosis of the cavernous and paraclinoid internal carotid arteries. Skull: Normal. Negative for fracture or focal lesion. Sinuses/Orbits: Patchy opacification of ethmoid air cells. Otherwise visualized paranasal sinuses and mastoid air cells are normally aerated. Other: None. IMPRESSION: 1. No acute intracranial abnormality identified. 2. Stable mild for age chronic microvascular ischemic changes and mild parenchymal volume loss of the brain. 3. Stable small chronic infarct in left cerebellar hemisphere. Electronically Signed   By: Kristine Garbe M.D.   On: 11/26/2016 00:46   Dg Chest Port 1 View  Result Date: 11/26/2016 CLINICAL  DATA:  Altered mental status EXAM: PORTABLE CHEST 1 VIEW COMPARISON:  02/09/2016 FINDINGS: The heart size and mediastinal contours are within normal limits. Aortic atherosclerosis is again noted without aneurysm involving the arch. Loop recorder device is seen projecting over the left lung base. Both lungs are clear. The visualized skeletal structures are unremarkable. IMPRESSION: Aortic atherosclerosis.  No acute cardiopulmonary disease. Electronically Signed   By: Ashley Royalty M.D.   On: 11/26/2016 00:56    Review of Systems  Constitutional: Positive for chills. Negative for fever.  HENT: Negative for sore throat and tinnitus.   Eyes: Negative for blurred vision and redness.  Respiratory: Negative for cough and shortness of breath.   Cardiovascular: Negative for chest pain, palpitations, orthopnea and PND.  Gastrointestinal: Negative for abdominal pain, diarrhea, nausea and vomiting.  Genitourinary: Negative for dysuria, frequency and urgency.  Musculoskeletal: Negative for joint pain and myalgias.  Skin: Negative for rash.       No lesions  Neurological: Positive for weakness. Negative for speech change and focal weakness.  Endo/Heme/Allergies: Does not bruise/bleed easily.       No temperature intolerance  Psychiatric/Behavioral: Negative for  depression and suicidal ideas.    Blood pressure (!) 133/95, pulse 79, temperature 99.1 F (37.3 C), temperature source Oral, resp. rate 16, height 5' 11"  (1.803 m), weight 113.4 kg (250 lb), SpO2 98 %. Physical Exam  Nursing note and vitals reviewed. Constitutional: He is oriented to person, place, and time. He appears well-developed and well-nourished. No distress.  HENT:  Head: Normocephalic and atraumatic.  Mouth/Throat: Oropharynx is clear and moist.  Eyes: Conjunctivae and EOM are normal. Pupils are equal, round, and reactive to light. No scleral icterus.  Neck: Normal range of motion. Neck supple. No JVD present. No tracheal deviation  present. No thyromegaly present.  Cardiovascular: Normal rate, regular rhythm and normal heart sounds.  Exam reveals no gallop and no friction rub.   No murmur heard. Respiratory: Effort normal and breath sounds normal. No respiratory distress.  GI: Soft. Bowel sounds are normal. He exhibits no distension. There is no tenderness.  Genitourinary:  Genitourinary Comments: Deferred  Musculoskeletal: Normal range of motion. He exhibits no edema.  Lymphadenopathy:    He has no cervical adenopathy.  Neurological: He is alert and oriented to person, place, and time. He displays normal reflexes. No cranial nerve deficit. Coordination (difficulty with rapid alternating movement and mild dysmetria R>L) abnormal.  Skin: Skin is warm and dry. No rash noted. No erythema.  Psychiatric: He has a normal mood and affect. His behavior is normal. Judgment and thought content normal.     Assessment/Plan This is a 71 year old male admitted for weakness. 1. Weakness: rule out TIA. The patient has no focal neurological deficits. Nonetheless, obtain echocardiogram and MRI if possible. (Patient has implanted loop recorder placed after previous CVA). CT shows old left cerebellar stroke but the patient had bilateral symptoms with his previous stroke which warranted TEE at that time.  He has some mental slowing and difficulty with word finding at times which may be a sequelae of his previous stroke as well. Consult neurology. Continue aspirin and Plavix.  2. Parkinsonian features: involuntary fasciculations and difficulty initiating movement at times.  Strong family history of parkinson's disease.  Mental slowing may be secondary to this as well. 3. Leukocytosis: differential includes viral syndrome although CBC differential is more consistent with bacterial infection.  Lungs and urine clear. Screen for influenza. 4. Hypertension: Uncontrolled; continue hydrochlorothiazide and lisinopril. Labetalol as needed. 5.  Hyperlipidemia: Continue statin therapy 6. Depression: Continue Remeron and Lexapro 7. DVT prophylaxis: Lovenox 8. GI prophylaxis: PPI per home regimen The patient is a full code. Time spent on admission orders and patient care approximately 45 minutes  Harrie Foreman, MD 11/26/2016, 7:01 AM

## 2016-11-26 NOTE — ED Triage Notes (Signed)
Pt brought in from home via EMS.  Pt has new AMS, after having large BM today.  Wife reports last known well at yesterday at noon.  Pt slow to answer questions and forgetful at this time.  Pt has history of stroke in 2016 and subdural head bleed in 2017.  Per EMS pt has temp of 100.5 in route.  Pt was constipated earlier today.

## 2016-11-26 NOTE — Progress Notes (Signed)
PT Cancellation Note  Patient Details Name: Maurice Moses MRN: [id number] DOB: [birth year]   Cancelled Treatment:    Reason Eval/Treat Not Completed: Patient declined, no reason specified (Evaluation re-attempted.  Patient adamantly refusing despite max encouragement from both PT/OT.  Unable to elaborate reason, repetitively stating "I just want to go home". Unable to reason with or redirect patient.  Will continue efforts next date as patient agreeable and medically appropriate.)  Of note, patient lives with wife in two-story home with bed/bathroom on main level of home; 5 steps, bilat rails (close enough to hold both) to enter.  Patient able to complete ADLs, household mobility without assist device; occasional assist from wife as needed.  Endorses 2-3 falls in previous six months.  Has SPC, RW and WC available if needed.  Has completed course of STR and HHPT in the past (previous CVA and SDH); prefers home with HHPT.  [name], PT, DPT, NCS 11/26/16, 3:27 PM [phone]

## 2016-11-26 NOTE — Care Management Obs Status (Signed)
MEDICARE OBSERVATION STATUS NOTIFICATION   Patient Details  Name: Maurice Moses MRN: [id number] Date of Birth: [birth year]   Medicare Observation Status Notification Given:  Yes    Gwenette Greet, RN 11/26/2016, 1:55 PM

## 2016-11-26 NOTE — Care Management (Signed)
Admitted to this facility under observation status with the diagnosis of CVA. Lives with wife, Amoret [phone]). Last seen Dr. Doug Sou 1//11/18 at [facility], next appointment is this Thursday. Prescriptions are filled at [facility]) in [place]. Home health per [facility] in the past as well as [facility]. [facility] x 10 days 07/2015. No home oxygen. Wheelchair and rolling walker, if needed. Uses cane. Self feed, self dress, uses shower chair for baths, Drives very little. Wife does errands. No falls. Good appetite. Wife will transport. Stroke work-up in progress. Gwenette Greet RN MSN CCM Care Management [phone]

## 2016-11-26 NOTE — Progress Notes (Signed)
SLP Cancellation Note  Patient Details Name: Maurice Moses MRN: [id number] DOB: [birth year]   Cancelled treatment:       Reason Eval/Treat Not Completed: SLP screened, no needs identified, will sign off (Chart reviewed ) Pt and wife denied any concerns with speech/language that is different from pt's baseline this date; wife stated "there is no change from this hospital visit" than his normal speech. Pt's wife with reports of pt's "processing time" being slower than "normal" with this starting "before he had a stroke" 2 years ago. Pt and wife denied need for Skilled ST services during hospitalization, SLP educated pt and wife on following up with primary care physician if they feel Skilled ST services would be of benefit to which the wife answered " we have a family member that is a SLP, we would go to her if we felt we needed anything. She lives in [place] which is not too far for [place]".  Pt able to converse with wife and SLP at conversational level, pt did appear to have min reduced processing time intermitted  with times of spontaneous speech as well.   Pt finishing lunch tray upon arrival, with no swallowing difficulty noted or observed. No further skilled ST services indicated, NSG to re-consult if any change in status.    Ardelia Mems, B.S Graduate Clinician  11/26/2016, 1:30 PM

## 2016-11-26 NOTE — Consult Note (Signed)
Reason for Consult:LE weakness  Referring Physician: Dr. Imogene Burn   CC: LE weakness   HPI: Maurice Moses is an 71 y.o. male  medical history of cerebrovascular accident, falls with subsequent subdural hematoma and hypertension presents emergency department complaining of weakness. As per family pt had b/l LE weakness along with fever.  Now currently back to baseline.    Past Medical History:  Diagnosis Date  . Cerebellar stroke (HCC)   . Depression   . Hypertension   . IBS (irritable bowel syndrome)   . Stroke (cerebrum) (HCC)   . Subdural hemorrhage [facility])     Past Surgical History:  Procedure Laterality Date  . LOOP RECORDER INSERTION      Family History  Problem Relation Age of Onset  . Hypertension Other     Social History:  reports that he has quit smoking. He has never used smokeless tobacco. He reports that he does not drink alcohol or use drugs.  Allergies  Allergen Reactions  . Doxycycline Nausea And Vomiting  . Morphine And Related Other (See Comments)    Agitation - wife does not want him to have it    Medications: I have reviewed the patient's current medications.  ROS: History obtained from the patient    General ROS: negative for - chills, fatigue, fever, night sweats, weight gain or weight loss Psychological ROS: negative for - behavioral disorder, hallucinations, memory difficulties, mood swings or suicidal ideation Ophthalmic ROS: negative for - blurry vision, double vision, eye pain or loss of vision ENT ROS: negative for - epistaxis, nasal discharge, oral lesions, sore throat, tinnitus or vertigo Allergy and Immunology ROS: negative for - hives or itchy/watery eyes Hematological and Lymphatic ROS: negative for - bleeding problems, bruising or swollen lymph nodes Endocrine ROS: negative for - galactorrhea, hair pattern changes, polydipsia/polyuria or temperature intolerance Respiratory ROS: negative for - cough, hemoptysis, shortness of breath or  wheezing Cardiovascular ROS: negative for - chest pain, dyspnea on exertion, edema or irregular heartbeat Gastrointestinal ROS: negative for - abdominal pain, diarrhea, hematemesis, nausea/vomiting or stool incontinence Genito-Urinary ROS: negative for - dysuria, hematuria, incontinence or urinary frequency/urgency Musculoskeletal ROS: negative for - joint swelling or muscular weakness Neurological ROS: as noted in HPI Dermatological ROS: negative for rash and skin lesion changes  Physical Examination: Blood pressure 135/80, pulse 71, temperature 98.7 F (37.1 C), temperature source Oral, resp. rate 20, height  (1.803 m), weight 113.4 kg (250 lb), SpO2 96 %.    Neurological Examination   Mental Status: Alert, oriented, thought content appropriate.  Speech fluent without evidence of aphasia.  Able to follow 3 step commands without difficulty. Cranial Nerves: II: Discs flat bilaterally; Visual fields grossly normal, pupils equal, round, reactive to light and accommodation III,IV, VI: ptosis not present, extra-ocular motions intact bilaterally V,VII: smile symmetric, facial light touch sensation normal bilaterally VIII: hearing normal bilaterally IX,X: gag reflex present XI: bilateral shoulder shrug XII: midline tongue extension Motor: Right : Upper extremity   5/5    Left:     Upper extremity   5/5  Lower extremity   5/5     Lower extremity   5/5 Tone and bulk:normal tone throughout; no atrophy noted Sensory: Pinprick and light touch intact throughout, bilaterally Deep Tendon Reflexes: 2+ and symmetric throughout Plantars: Right: downgoing   Left: downgoing Cerebellar: normal finger-to-nose, normal rapid alternating movements and normal heel-to-shin test Gait: not tested       Laboratory Studies:   Basic Metabolic Panel:  Recent  Labs Lab 11/26/16 0011 11/26/16 0519  NA 135  --   K 3.2*  --   CL 99*  --   CO2 29  --   GLUCOSE 142*  --   BUN 14  --   CREATININE  1.24  --   CALCIUM 9.8  --   MG  --  2.0    Liver Function Tests:  Recent Labs Lab 11/26/16 0011  AST 39  ALT 36  ALKPHOS 89  BILITOT 0.9  PROT 7.0  ALBUMIN 4.0   No results for input(s): LIPASE, AMYLASE in the last 168 hours. No results for input(s): AMMONIA in the last 168 hours.  CBC:  Recent Labs Lab 11/26/16 0011  WBC 15.8*  HGB 15.6  HCT 45.8  MCV 90.9  PLT 278    Cardiac Enzymes:  Recent Labs Lab 11/26/16 0011  TROPONINI <0.03    BNP: Invalid input(s): POCBNP  CBG: No results for input(s): GLUCAP in the last 168 hours.  Microbiology: Results for orders placed or performed during the hospital encounter of 11/25/16  Culture, blood (routine x 2)     Status: None (Preliminary result)   Collection Time: 11/26/16 12:17 AM  Result Value Ref Range Status   Specimen Description BLOOD RIGHT WRIST  Final   Special Requests   Final    BOTTLES DRAWN AEROBIC AND ANAEROBIC Blood Culture results may not be optimal due to an excessive volume of blood received in culture bottles   Culture NO GROWTH < 12 HOURS  Final   Report Status PENDING  Incomplete  Culture, blood (routine x 2)     Status: None (Preliminary result)   Collection Time: 11/26/16 12:54 AM  Result Value Ref Range Status   Specimen Description BLOOD BLOOD RIGHT HAND  Final   Special Requests   Final    BOTTLES DRAWN AEROBIC AND ANAEROBIC Blood Culture adequate volume   Culture NO GROWTH < 12 HOURS  Final   Report Status PENDING  Incomplete    Coagulation Studies: No results for input(s): LABPROT, INR in the last 72 hours.  Urinalysis:  Recent Labs Lab 11/26/16 0125  COLORURINE YELLOW*  LABSPEC 1.011  PHURINE 7.0  GLUCOSEU NEGATIVE  HGBUR NEGATIVE  BILIRUBINUR NEGATIVE  KETONESUR NEGATIVE  PROTEINUR NEGATIVE  NITRITE NEGATIVE  LEUKOCYTESUR NEGATIVE    Lipid Panel:     Component Value Date/Time   CHOL 139 11/26/2016 0519   TRIG 126 11/26/2016 0519   HDL 44 11/26/2016 0519    CHOLHDL 3.2 11/26/2016 0519   VLDL 25 11/26/2016 0519   LDLCALC 70 11/26/2016 0519    HgbA1C:  Lab Results  Component Value Date   HGBA1C 5.3 07/24/2015    Urine Drug Screen:  No results found for: LABOPIA, COCAINSCRNUR, LABBENZ, AMPHETMU, THCU, LABBARB  Alcohol Level: No results for input(s): ETH in the last 168 hours.  Other results: EKG: normal EKG, normal sinus rhythm, unchanged from previous tracings.  Imaging: Ct Head Wo Contrast  Result Date: 11/26/2016 CLINICAL DATA:  71 y/o  M; altered mental status. EXAM: CT HEAD WITHOUT CONTRAST TECHNIQUE: Contiguous axial images were obtained from the base of the skull through the vertex without intravenous contrast. COMPARISON:  10/02/2015 CT of the head. FINDINGS: Brain: No evidence of acute infarction, hemorrhage, hydrocephalus, extra-axial collection or mass lesion/mass effect. Stable left chronic cerebellar infarction. Stable mild chronic microvascular ischemic changes of white matter and brain parenchymal volume loss. Vascular: Mild calcific atherosclerosis of the cavernous and paraclinoid internal carotid  arteries. Skull: Normal. Negative for fracture or focal lesion. Sinuses/Orbits: Patchy opacification of ethmoid air cells. Otherwise visualized paranasal sinuses and mastoid air cells are normally aerated. Other: None. IMPRESSION: 1. No acute intracranial abnormality identified. 2. Stable mild for age chronic microvascular ischemic changes and mild parenchymal volume loss of the brain. 3. Stable small chronic infarct in left cerebellar hemisphere. Electronically Signed   By: Mitzi Hansen M.D.   On: 11/26/2016 00:46   Mr Brain Wo Contrast  Result Date: 11/26/2016 CLINICAL DATA:  Weakness. EXAM: MRI HEAD WITHOUT CONTRAST MRA HEAD WITHOUT CONTRAST TECHNIQUE: Multiplanar, multiecho pulse sequences of the brain and surrounding structures were obtained without intravenous contrast. Angiographic images of the head were obtained using  MRA technique without contrast. COMPARISON:  Head CT 11/26/2016. Head MRI/ MRA 07/24/2015. Head CTA 07/25/2015. FINDINGS: MRI HEAD FINDINGS Brain: There is no evidence of acute infarct, intracranial hemorrhage, mass, midline shift, or extra-axial fluid collection. There is moderate cerebral atrophy. Chronic infarcts are again seen involving the left greater than right cerebellum. Minimal cerebral white matter T2 signal changes are not greater than expected for patient's age. Vascular: Major intracranial vascular flow voids are preserved. Skull and upper cervical spine: Unremarkable bone marrow signal. Sinuses/Orbits: Unremarkable orbits. Minimal mucosal thickening in the ethmoid sinuses and right mastoid air cells. Other: None. MRA HEAD FINDINGS The visualized distal vertebral arteries are widely patent to the basilar. Patent AICA and SCA origins are visualized bilaterally. The basilar artery is widely patent. There is a patent right posterior communicating artery. PCAs are patent with severe proximal P2 stenoses again noted on the left. There are bilateral distal cervical ICA loops. The intracranial ICAs are widely patent from skullbase to carotid termini. ACAs and MCAs are patent without evidence of major branch occlusion or significant proximal stenosis. No intracranial aneurysm is identified. IMPRESSION: 1. No acute intracranial abnormality. 2. Chronic cerebellar infarcts. 3. Unchanged severe left P2 PCA stenoses. No large vessel occlusion. Electronically Signed   By: Sebastian Ache M.D.   On: 11/26/2016 12:24   US Carotid Bilateral (at [facility] Only)  Result Date: 11/26/2016 CLINICAL DATA:  CVA EXAM: BILATERAL CAROTID DUPLEX ULTRASOUND TECHNIQUE: Wallace Cullens scale imaging, color Doppler and duplex ultrasound were performed of bilateral carotid and vertebral arteries in the neck. COMPARISON:  None. FINDINGS: Criteria: Quantification of carotid stenosis is based on velocity parameters that correlate the residual  internal carotid diameter with NASCET-based stenosis levels, using the diameter of the distal internal carotid lumen as the denominator for stenosis measurement. The following velocity measurements were obtained: RIGHT ICA:  94 cm/sec CCA:  108 cm/sec SYSTOLIC ICA/CCA RATIO:  0.9 DIASTOLIC ICA/CCA RATIO:  1.5 ECA:  103 cm/sec LEFT ICA:  83 cm/sec CCA:  147 cm/sec SYSTOLIC ICA/CCA RATIO:  0.6 DIASTOLIC ICA/CCA RATIO:  1.0 ECA:  113 cm/sec RIGHT CAROTID ARTERY: Mild calcified plaque in the bulb. Low resistance internal carotid Doppler pattern. RIGHT VERTEBRAL ARTERY:  Antegrade. LEFT CAROTID ARTERY: Moderate calcified plaque in the bulb. Low resistance internal carotid Doppler pattern. LEFT VERTEBRAL ARTERY:  Antegrade. IMPRESSION: Less than 50% stenosis in the right and left internal carotid arteries. Electronically Signed   By: Jolaine Click M.D.   On: 11/26/2016 13:32   Dg Chest Port 1 View  Result Date: 11/26/2016 CLINICAL DATA:  Altered mental status EXAM: PORTABLE CHEST 1 VIEW COMPARISON:  02/09/2016 FINDINGS: The heart size and mediastinal contours are within normal limits. Aortic atherosclerosis is again noted without aneurysm involving the arch. Loop recorder  device is seen projecting over the left lung base. Both lungs are clear. The visualized skeletal structures are unremarkable. IMPRESSION: Aortic atherosclerosis.  No acute cardiopulmonary disease. Electronically Signed   By: Tollie Eth M.D.   On: 11/26/2016 00:56   Mr Maxine Glenn Head/brain JX Cm  Result Date: 11/26/2016 CLINICAL DATA:  Weakness. EXAM: MRI HEAD WITHOUT CONTRAST MRA HEAD WITHOUT CONTRAST TECHNIQUE: Multiplanar, multiecho pulse sequences of the brain and surrounding structures were obtained without intravenous contrast. Angiographic images of the head were obtained using MRA technique without contrast. COMPARISON:  Head CT 11/26/2016. Head MRI/ MRA 07/24/2015. Head CTA 07/25/2015. FINDINGS: MRI HEAD FINDINGS Brain: There is no evidence of  acute infarct, intracranial hemorrhage, mass, midline shift, or extra-axial fluid collection. There is moderate cerebral atrophy. Chronic infarcts are again seen involving the left greater than right cerebellum. Minimal cerebral white matter T2 signal changes are not greater than expected for patient's age. Vascular: Major intracranial vascular flow voids are preserved. Skull and upper cervical spine: Unremarkable bone marrow signal. Sinuses/Orbits: Unremarkable orbits. Minimal mucosal thickening in the ethmoid sinuses and right mastoid air cells. Other: None. MRA HEAD FINDINGS The visualized distal vertebral arteries are widely patent to the basilar. Patent AICA and SCA origins are visualized bilaterally. The basilar artery is widely patent. There is a patent right posterior communicating artery. PCAs are patent with severe proximal P2 stenoses again noted on the left. There are bilateral distal cervical ICA loops. The intracranial ICAs are widely patent from skullbase to carotid termini. ACAs and MCAs are patent without evidence of major branch occlusion or significant proximal stenosis. No intracranial aneurysm is identified. IMPRESSION: 1. No acute intracranial abnormality. 2. Chronic cerebellar infarcts. 3. Unchanged severe left P2 PCA stenoses. No large vessel occlusion. Electronically Signed   By: Sebastian Ache M.D.   On: 11/26/2016 12:24     Assessment/Plan:  71 y.o. male  medical history of cerebrovascular accident, falls with subsequent subdural hematoma and hypertension presents emergency department complaining of weakness. As per family pt had b/l LE weakness along with fever.  Now currently back to baseline.    - MRI brain no acute anomalities with chronic PCA stenosis - No further imaging from neuro stand point - I think he can be d/c even later tonight from neuro stand point.   11/26/2016, 2:21 PM

## 2016-11-26 NOTE — Progress Notes (Signed)
OT Cancellation Note  Patient Details Name: Unnamed Zeien MRN: [id number] DOB: 01/02/1946   Cancelled Treatment:    Reason Eval/Treat Not Completed: Patient at procedure or test/ unavailable. Order received, chart reviewed, pt out of room for testing. Will re-attempt OT evaluation at later date/time as available.  Richrd Prime, MPH, MS, OTR/L ascom [phone] 11/26/16, 10:48 AM

## 2016-11-26 NOTE — Progress Notes (Signed)
PT Cancellation Note  Patient Details Name: Maurice Moses MRN: [id number] DOB: [birth year]   Cancelled Treatment:    Reason Eval/Treat Not Completed: Other (comment). Consult received and chart reviewed. Pt currently in bathroom, reports it will be a long time before he is ready to get up and family requests PT to return at another time. Will re-attempt.    Lenita Peregrina 11/26/2016, 9:01 AM  Elizabeth Palau, PT, DPT [phone]

## 2016-11-26 NOTE — Progress Notes (Signed)
*  PRELIMINARY RESULTS* Echocardiogram 2D Echocardiogram has been performed.  Maurice Moses 11/26/2016, 12:32 PM

## 2016-11-26 NOTE — Evaluation (Signed)
Occupational Therapy Evaluation Patient Details Name: Maurice Moses MRN: [id number] DOB: 03-11-1946 Today's Date: 11/26/2016    History of Present Illness Pt is 71 year old male with past medical history of cerebrovascular accident Dec 2016 and a fall with subdural hematoma about 10 weeks later.  He also has hypertension and presented to emergency department complaining of weakness.  MRI was negative with no new CVA but with chronic cerebellar infarcts and unchanged severe L P2 stenosis.     Clinical Impression   Pt. Is an 71 y.o. male who was admitted with weakness with past hx of CVA in Dec 2016 and a fall after that with a subdural hematoma about 10 weeks later. Pt. presents with impaired cognition with expressive and receptive issues demonstrated during evaluation.  He was cooperative at first but then became very stubborn and refused to stand up from EOB or ambulate when PT arrived in room.  He had difficulty maintaining balance sitting EOB and needed hands on to keep him from falling over.  He also has impaired safety awareness and decreased functional mobility which hinder his ability to complete ADL and IADL tasks. Patient could benefit from skilled OT services to work on functional ADLs with focus on balance and safety training and pt./family education. Patient would be a good candidate to continue OT services at [facility]. Strongly rec his wife install grab bars in bathroom since patient is at risk for falls.    Follow Up Recommendations  Home health OT    Equipment Recommendations       Recommendations for Other Services       Precautions / Restrictions Precautions Precautions: Fall Restrictions Weight Bearing Restrictions: No      Mobility Bed Mobility                  Transfers                      Balance                                           ADL either performed or assessed with clinical judgement   ADL Overall ADL's : Needs  assistance/impaired Eating/Feeding: Independent;Set up   Grooming: Wash/dry hands;Wash/dry face;Oral care;Brushing hair;Minimal assistance;Set up Grooming Details (indicate cue type and reason): cues to complete and stay on task         Upper Body Dressing : Set up;Supervision/safety Upper Body Dressing Details (indicate cue type and reason): for pull over shirt only   Lower Body Dressing Details (indicate cue type and reason): Pt refused to complete LB doffing and donning of socks sitting EOB because he didn't want to nor did he agree to stand up to assess pants over hips.                     Vision Patient Visual Report: No change from baseline       Perception     Praxis      Pertinent Vitals/Pain Pain Assessment: No/denies pain     Hand Dominance Right   Extremity/Trunk Assessment Upper Extremity Assessment Upper Extremity Assessment: Overall WFL for tasks assessed   Lower Extremity Assessment Lower Extremity Assessment: Defer to PT evaluation       Communication Communication Communication: Expressive difficulties;Other (comment);Receptive difficulties (difficulty answering simple questions and relies on his wife to answer most  of time or uses humor.  )   Cognition Arousal/Alertness: Awake/alert Behavior During Therapy: Restless (pt was stubborn and not wanting to get up out of bed for OT or PT because he did not want to and knows he is stubborn.) Overall Cognitive Status: History of cognitive impairments - at baseline                                 General Comments: Pt had a bad experience with [facility] PT in the past and is anxious about seeing therapists now.     General Comments       Exercises     Shoulder Instructions      Home Living Family/patient expects to be discharged to:: Private residence Living Arrangements: Spouse/significant other   Type of Home: House Home Access: Stairs to enter [employer] of Steps:  5 Entrance Stairs-Rails: Can reach both Home Layout: Two level;Able to live on main level with bedroom/bathroom     Bathroom Shower/Tub: Tub/shower unit;Curtain   Bathroom Toilet: Standard Bathroom Accessibility: Yes How Accessible: Accessible via walker Home Equipment: Cane - single point;Walker - 2 wheels;Shower seat   Additional Comments: has grab bars to put in next to toilet but has not done so yet.  Tried BSC over toilet but he was urinating in between and urine went all over floor.      Prior Functioning/Environment Level of Independence: Needs assistance  Gait / Transfers Assistance Needed: Has a walker and SPC at home but was not using either one.  Difficulty staying inside walker and tends to push out in front of him. ADL's / Homemaking Assistance Needed: Wife was helping intetmittently with sock and shoes as needed and used chair to shower.  2-3 falls in last few months.            OT Problem List: Decreased activity tolerance;Decreased cognition;Decreased safety awareness      OT Treatment/Interventions: Self-care/ADL training;Patient/family education;Therapeutic activities;Balance training    OT Goals(Current goals can be found in the care plan section) Acute Rehab OT Goals Patient Stated Goal: "to go home today" OT Goal Formulation: With patient/family Time For Goal Achievement: 12/10/16 Potential to Achieve Goals: Fair ADL Goals Pt Will Perform Lower Body Dressing: with modified independence;with set-up;sit to/from stand (with no LOB) Pt Will Transfer to Toilet: with set-up;with modified independence;regular height toilet;stand pivot transfer (with no LOB)  OT Frequency: Min 1X/week   Barriers to D/C:            Co-evaluation              End of Session    Activity Tolerance: Patient tolerated treatment well Patient left: in bed;with call bell/phone within reach;with bed alarm set;with family/visitor present  OT Visit Diagnosis: Unsteadiness on  feet (R26.81);Muscle weakness (generalized) (M62.81);Repeated falls (R29.6)                Time: 1345-1430 OT Time Calculation (min): 45 min Charges:  OT General Charges $OT Visit: 1 Procedure OT Evaluation $OT Eval Moderate Complexity: 1 Procedure OT Treatments $Self Care/Home Management : 23-37 mins G-Codes: OT G-codes **NOT FOR INPATIENT CLASS** Functional Limitation: Self care Self Care Current Status (Z6109): At least 40 percent but less than 60 percent impaired, limited or restricted Self Care Goal Status (U0454): At least 1 percent but less than 20 percent impaired, limited or restricted   Susanne Borders, OTR/L ascom [phone] 11/26/16, 3:11 PM

## 2016-11-26 NOTE — ED Provider Notes (Signed)
[facility] Emergency Department Provider Note   ____________________________________________   First MD Initiated Contact with Patient 11/26/16 0020     (approximate)  I have reviewed the triage vital signs and the nursing notes.   HISTORY  Chief Complaint Altered Mental Status    HPI Maurice Moses is a 71 y.o. male brought to the ED from home via EMS with a chief complaint of altered mental status. Patient has a history of CVA in 2016, subdural hemorrhage in 2017; currently on Plavix and aspirin. Spouse states patient was in his last known well state yesterday at noon. States he has had progressive cognitive slowing, forgetfulness and confusion. States his balance has been off today as well. Patient usually ambulates without assistance but today he has had generalized weakness to the point of requiring heavy assistance from his wife in order to ambulate. EMS reports temperature of 100.6F. Spouse got 99.6F at home. Reports patient was constipated today so he receiveda stool softener. He was having a large bowel movement when wife noted his confusion. Denies associated chest pain, shortness of breath, abdominal pain, nausea, vomiting. Denies recent travel or trauma. Nothing makes his symptoms better or worse.   Past Medical History:  Diagnosis Date  . Cerebellar stroke (HCC)   . Depression   . Hypertension   . IBS (irritable bowel syndrome)   . Stroke (cerebrum) (HCC)   . Subdural hemorrhage [facility])     Patient Active Problem List   Diagnosis Date Noted  . 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 TIA (transient ischemic attack) 07/23/2015    History reviewed. No pertinent surgical history.  Prior to Admission medications   Medication Sig Start Date End Date Taking? Authorizing Provider  aspirin EC 81 MG EC tablet Take 1 tablet (81 mg total) by mouth daily. 07/26/15  Yes Auburn Bilberry, MD  atorvastatin (LIPITOR) 40 MG tablet Take 1 tablet (40 mg  total) by mouth daily at 6 PM. 07/26/15  Yes Auburn Bilberry, MD  cetirizine (ZYRTEC) 10 MG tablet Take 10 mg by mouth daily.   Yes Historical Provider, MD  clopidogrel (PLAVIX) 75 MG tablet Take 1 tablet (75 mg total) by mouth daily. 07/26/15  Yes Auburn Bilberry, MD  clorazepate (TRANXENE) 7.5 MG tablet Take 1 tablet by mouth daily as needed for anxiety.    Yes Historical Provider, MD  docusate sodium (COLACE) 100 MG capsule Take 100 mg by mouth 3 (three) times daily.    Yes Historical Provider, MD  escitalopram (LEXAPRO) 20 MG tablet Take 20 mg by mouth daily.   Yes Historical Provider, MD  esomeprazole (NEXIUM) 40 MG capsule Take 40 mg by mouth daily at 12 noon.   Yes Historical Provider, MD  hydrochlorothiazide (HYDRODIURIL) 25 MG tablet Take 25 mg by mouth daily.   Yes Historical Provider, MD  HYDROcodone-acetaminophen (NORCO/VICODIN) 5-325 MG tablet Take 1-2 tablets by mouth every 4 (four) hours as needed for moderate pain (Max 7 per day). 07/26/15  Yes Auburn Bilberry, MD  mirtazapine (REMERON SOL-TAB) 30 MG disintegrating tablet Take 1 tablet by mouth daily.   Yes Historical Provider, MD  quinapril (ACCUPRIL) 40 MG tablet Take 40 mg by mouth at bedtime.   Yes Historical Provider, MD  VESICARE 10 MG tablet Take 10 mg by mouth daily. 10/24/16  Yes Historical Provider, MD    Allergies Doxycycline and Morphine and related  Family History  Problem Relation Age of Onset  . Hypertension Other     Social History Social  History  Substance Use Topics  . Smoking status: Former Games developer  . Smokeless tobacco: Never Used  . Alcohol use No    Review of Systems  Constitutional: No fever/chills. Eyes: No visual changes. ENT: No sore throat. Cardiovascular: Denies chest pain. Respiratory: Denies shortness of breath. Gastrointestinal: No abdominal pain.  No nausea, no vomiting.  No diarrhea.  No constipation. Genitourinary: Negative for dysuria. Musculoskeletal: Negative for back pain. Skin:  Negative for rash. Neurological: Positive for unsteady gait. Negative for headaches, focal weakness or numbness.  10-point ROS otherwise negative.  ____________________________________________   PHYSICAL EXAM:  VITAL SIGNS: ED Triage Vitals [11/26/16 0010]  Enc Vitals Group     BP (!) 157/100     Pulse Rate 97     Resp 19     Temp 99.7 F (37.6 C)     Temp src      SpO2 93 %     Weight 250 lb (113.4 kg)     Height  (1.803 m)     Head Circumference      Peak Flow      Pain Score      Pain Loc      Pain Edu?      Excl. in GC?     Constitutional: Alert and oriented. Well appearing and in no acute distress. Eyes: Conjunctivae are normal. PERRL. EOMI. Head: Atraumatic. Nose: No congestion/rhinnorhea. Mouth/Throat: Mucous membranes are moist.  Oropharynx non-erythematous. Neck: No stridor.  Supple neck without meningismus.  No carotid bruits. Cardiovascular: Normal rate, regular rhythm. Grossly normal heart sounds.  Good peripheral circulation. Respiratory: Normal respiratory effort.  No retractions. Lungs CTAB. Gastrointestinal: Soft and nontender. No distention. No abdominal bruits. No CVA tenderness. Musculoskeletal: No lower extremity tenderness nor edema.  No joint effusions. Neurologic:  Delayed speech and language. CN II-XII grossly intact. Mild left hand grip weakness which spouse states is baseline for patient. 4/5 RLE weakness. Skin:  Skin is warm, dry and intact. No rash noted. Psychiatric: Mood and affect are normal. Speech and behavior are normal.  ____________________________________________   LABS (all labs ordered are listed, but only abnormal results are displayed)  Labs Reviewed  COMPREHENSIVE METABOLIC PANEL - Abnormal; Notable for the following:       Result Value   Potassium 3.2 (*)    Chloride 99 (*)    Glucose, Bld 142 (*)    GFR calc non Af Amer 56 (*)    All other components within normal limits  CBC - Abnormal; Notable for the  following:    WBC 15.8 (*)    All other components within normal limits  LACTIC ACID, PLASMA - Abnormal; Notable for the following:    Lactic Acid, Venous 2.0 (*)    All other components within normal limits  URINALYSIS, COMPLETE (UACMP) WITH MICROSCOPIC - Abnormal; Notable for the following:    Color, Urine YELLOW (*)    APPearance CLEAR (*)    Squamous Epithelial / LPF 0-5 (*)    All other components within normal limits  CULTURE, BLOOD (ROUTINE X 2)  CULTURE, BLOOD (ROUTINE X 2)  URINE CULTURE  TROPONIN I  LACTIC ACID, PLASMA  CBG MONITORING, ED   ____________________________________________  EKG  ED ECG REPORT I, Solei Wubben J, the attending physician, personally viewed and interpreted this ECG.   Date: 11/26/2016  EKG Time: 0037  Rate: 92  Rhythm: normal EKG, normal sinus rhythm  Axis: Normal  Intervals:none  ST&T Change: Nonspecific  ____________________________________________  RADIOLOGY  CT head interpreted per Dr. Harrie Jeans: 1. No acute intracranial abnormality identified.  2. Stable mild for age chronic microvascular ischemic changes and  mild parenchymal volume loss of the brain.  3. Stable small chronic infarct in left cerebellar hemisphere.   Portable chest x-ray interpreted per Dr. Sterling Big: Aortic atherosclerosis. No acute cardiopulmonary disease. ____________________________________________   PROCEDURES  Procedure(s) performed: None  Procedures  Critical Care performed: No  ____________________________________________   INITIAL IMPRESSION / ASSESSMENT AND PLAN / ED COURSE  Pertinent labs & imaging results that were available during my care of the patient were reviewed by me and considered in my medical decision making (see chart for details).  71 year old male with prior history of CVA and SDH who presents with altered mentation and low-grade fever. Will send for urgent CT head to evaluate for intracranial hemorrhage, obtain screening lab work  including lactate, and reassess.  Clinical Course as of Nov 27 222  Mon Nov 26, 2016  0223 Updated patient and spouse of laboratory and imaging results. Will discuss with hospitalist evaluate patient in the emergency department for admission. Will repeat lactic acid. No indication currently for IV antibiotics.  [JS]    Clinical Course User Index [JS] Irean Hong, MD     ____________________________________________   FINAL CLINICAL IMPRESSION(S) / ED DIAGNOSES  Final diagnoses:  Altered mental status, unspecified altered mental status type      NEW MEDICATIONS STARTED DURING THIS VISIT:  New Prescriptions   No medications on file     Note:  This document was prepared using Dragon voice recognition software and may include unintentional dictation errors.    Irean Hong, MD 11/26/16 [phone]

## 2016-11-26 NOTE — Discharge Instructions (Signed)
Heart healthy diet. Fall precaution. Follow-up CBC and potassium level with PCP.

## 2016-11-26 NOTE — Progress Notes (Signed)
Patient was negative for CVA discontinue neuro checks, NIH,a d vital signs. Routine vitals ordered.

## 2016-11-26 NOTE — Progress Notes (Signed)
PT Cancellation Note  Patient Details Name: Maurice Moses MRN: [id number] DOB: 10-13-1945   Cancelled Treatment:    Reason Eval/Treat Not Completed: Other (comment). 2nd attempt, pt out of room for imaging. Will re-attempt.   Maurice Moses 11/26/2016, 10:19 AM Elizabeth Palau, PT, DPT [phone]

## 2016-11-27 LAB — URINE CULTURE

## 2016-11-27 LAB — HEMOGLOBIN A1C
Hgb A1c MFr Bld: 5.4 % (ref 4.8–5.6)
Mean Plasma Glucose: 108 mg/dL

## 2016-12-01 LAB — CULTURE, BLOOD (ROUTINE X 2)
CULTURE: NO GROWTH
Culture: NO GROWTH
SPECIAL REQUESTS: ADEQUATE

## 2017-05-23 ENCOUNTER — Encounter: Payer: Self-pay | Admitting: Intensive Care

## 2017-05-23 ENCOUNTER — Emergency Department
Admission: EM | Admit: 2017-05-23 | Discharge: 2017-05-23 | Disposition: A | Discharge status: Home / Self Care | Payer: Medicare Other | Attending: Emergency Medicine | Admitting: Emergency Medicine

## 2017-05-23 ENCOUNTER — Emergency Department: Payer: Medicare Other

## 2017-05-23 DIAGNOSIS — Y92007 Garden or yard of unspecified non-institutional (private) residence as the place of occurrence of the external cause: Secondary | ICD-10-CM | POA: Diagnosis not present

## 2017-05-23 DIAGNOSIS — W010XXA Fall on same level from slipping, tripping and stumbling without subsequent striking against object, initial encounter: Secondary | ICD-10-CM | POA: Insufficient documentation

## 2017-05-23 DIAGNOSIS — Y9389 Activity, other specified: Secondary | ICD-10-CM | POA: Diagnosis not present

## 2017-05-23 DIAGNOSIS — I10 Essential (primary) hypertension: Secondary | ICD-10-CM | POA: Insufficient documentation

## 2017-05-23 DIAGNOSIS — W19XXXA Unspecified fall, initial encounter: Secondary | ICD-10-CM

## 2017-05-23 DIAGNOSIS — Y999 Unspecified external cause status: Secondary | ICD-10-CM | POA: Insufficient documentation

## 2017-05-23 DIAGNOSIS — S0990XA Unspecified injury of head, initial encounter: Secondary | ICD-10-CM | POA: Diagnosis not present

## 2017-05-23 DIAGNOSIS — Z87891 Personal history of nicotine dependence: Secondary | ICD-10-CM | POA: Diagnosis not present

## 2017-05-23 HISTORY — DX: Cerebral infarction, unspecified: I63.9

## 2017-05-23 MED ORDER — TRAMADOL HCL 50 MG PO TABS: 50.0000 mg | ORAL_TABLET | Freq: Four times a day (QID) | ORAL | 0 refills | Status: DC | PRN

## 2017-05-23 NOTE — ED Triage Notes (Addendum)
Patient had unwitnessed fall today around 3pm on his porch. Patient reports tripping over his feet which caused fall. Denies LOC. Reports hitting head. Abrasion under R eye and bruising with swelling noted on R forearm. HX stroke 2016, subdural bleed 2017. Takes plavix daily. A&O x4 upon arrival to ER. Rainbow draw done in triage and sent to lab

## 2017-05-23 NOTE — ED Provider Notes (Signed)
[facility] Emergency Department Provider Note       Time seen: ----------------------------------------- 6:06 PM on 05/23/2017 -----------------------------------------     I have reviewed the triage vital signs and the nursing notes.   HISTORY   Chief Complaint Fall    HPI Maurice Moses is a 71 y.o. male who presents to the ED for an unwitnessed fall that occurred around 3 PM today on his porch. Patient reports tripping over his feet which caused the fall. He has a history of balance disturbance from a cerebellar stroke in the distant past. He does report falling hitting the right side of his head and around the right eye. He presents with a right eye abrasion. He reports a history of a subdural bleed in the past.  Past Medical History:  Diagnosis Date  . Cerebellar stroke (HCC)   . Depression   . Hypertension   . IBS (irritable bowel syndrome)   . Stroke (cerebrum) (HCC)   . Stroke (HCC)   . Subdural hemorrhage [facility])     Patient Active Problem List   Diagnosis Date Noted  . CVA (cerebral vascular accident) (HCC) 11/26/2016  . 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 TIA (transient ischemic attack) 07/23/2015    Past Surgical History:  Procedure Laterality Date  . LOOP RECORDER INSERTION      Allergies Doxycycline and Morphine and related  Social History Social History  Substance Use Topics  . Smoking status: Former Games developer  . Smokeless tobacco: Never Used  . Alcohol use Yes     Comment: occ    Review of Systems Constitutional: Negative for fever. Eyes: Negative for vision changes ENT:  Negative for congestion, sore throat Cardiovascular: Negative for chest pain. Respiratory: Negative for shortness of breath. Gastrointestinal: Negative for abdominal pain, vomiting and diarrhea. Musculoskeletal: Negative for back pain. Skin: positive for contusion and abrasion Neurological: Negative for headaches, focal weakness or  numbness.  All systems negative/normal/unremarkable except as stated in the HPI  ____________________________________________   PHYSICAL EXAM:  VITAL SIGNS: ED Triage Vitals  Enc Vitals Group     BP 05/23/17 1639 (!) 166/62     Pulse Rate 05/23/17 1639 (!) 105     Resp 05/23/17 1639 18     Temp 05/23/17 1639 98 F (36.7 C)     Temp Source 05/23/17 1639 Oral     SpO2 05/23/17 1639 96 %     Weight 05/23/17 1639 240 lb (108.9 kg)     Height 05/23/17 1639 5' 8.5" (1.74 m)     Head Circumference --      Peak Flow --      Pain Score 05/23/17 1638 3     Pain Loc --      Pain Edu? --      Excl. in GC? --     Constitutional: Alert and oriented. Well appearing and in no distress. Eyes: Conjunctivae are normal. Normal extraocular movements. ENT   Head: Normocephalic, small right zygoma contusion   Nose: No congestion/rhinnorhea.   Mouth/Throat: Mucous membranes are moist.   Neck: No stridor. Cardiovascular: Normal rate, regular rhythm. No murmurs, rubs, or gallops. Respiratory: Normal respiratory effort without tachypnea nor retractions. Breath sounds are clear and equal bilaterally. No wheezes/rales/rhonchi. Gastrointestinal: Soft and nontender. Normal bowel sounds Musculoskeletal: Nontender with normal range of motion in extremities. No lower extremity tenderness nor edema.no bony tenderness in the right arm Neurologic:  Normal speech and language. No gross focal neurologic deficits are appreciated.  Skin:  extensive right forearm ecchymosis over the volar aspect Psychiatric: Mood and affect are normal. Speech and behavior are normal.  ____________________________________________  ED COURSE:  Pertinent labs & imaging results that were available during my care of the patient were reviewed by me and considered in my medical decision making (see chart for details). Patient presents for a fall, we will assess with labs and imaging as indicated.    Procedures  RADIOLOGY  CT head IMPRESSION: Chronic changes as described. No posttraumatic sequelae are evident.  No skull fracture or intracranial hemorrhage.   ____________________________________________  DIFFERENTIAL DIAGNOSIS   subdural, subarachnoid, epidural, skull fracture, facial fracture, contusion, fracture   FINAL ASSESSMENT AND PLAN  minor head injury, contusion   Plan: Patient's imaging was dictated above. Patient had presented for a fall and sustained a minor head injury with negative CT imaging. He is stable for outpatient follow-up and has no further neurologic symptoms.   Emily Filbert, MD   Note: This note was generated in part or whole with voice recognition software. Voice recognition is usually quite accurate but there are transcription errors that can and very often do occur. I apologize for any typographical errors that were not detected and corrected.     Emily Filbert, MD 05/23/17 [name]

## 2017-12-14 ENCOUNTER — Observation Stay
Admission: EM | Admit: 2017-12-14 | Discharge: 2017-12-15 | Disposition: A | Discharge status: Home / Self Care | Payer: Medicare Other | Attending: Internal Medicine | Admitting: Internal Medicine

## 2017-12-14 ENCOUNTER — Emergency Department: Payer: Medicare Other

## 2017-12-14 ENCOUNTER — Other Ambulatory Visit: Payer: Self-pay

## 2017-12-14 DIAGNOSIS — Z7902 Long term (current) use of antithrombotics/antiplatelets: Secondary | ICD-10-CM | POA: Diagnosis not present

## 2017-12-14 DIAGNOSIS — I6503 Occlusion and stenosis of bilateral vertebral arteries: Secondary | ICD-10-CM | POA: Diagnosis not present

## 2017-12-14 DIAGNOSIS — Z885 Allergy status to narcotic agent status: Secondary | ICD-10-CM | POA: Insufficient documentation

## 2017-12-14 DIAGNOSIS — M1990 Unspecified osteoarthritis, unspecified site: Secondary | ICD-10-CM | POA: Diagnosis not present

## 2017-12-14 DIAGNOSIS — E785 Hyperlipidemia, unspecified: Secondary | ICD-10-CM | POA: Diagnosis not present

## 2017-12-14 DIAGNOSIS — F329 Major depressive disorder, single episode, unspecified: Secondary | ICD-10-CM | POA: Insufficient documentation

## 2017-12-14 DIAGNOSIS — K219 Gastro-esophageal reflux disease without esophagitis: Secondary | ICD-10-CM | POA: Diagnosis not present

## 2017-12-14 DIAGNOSIS — E876 Hypokalemia: Secondary | ICD-10-CM | POA: Diagnosis not present

## 2017-12-14 DIAGNOSIS — I69311 Memory deficit following cerebral infarction: Secondary | ICD-10-CM | POA: Insufficient documentation

## 2017-12-14 DIAGNOSIS — K625 Hemorrhage of anus and rectum: Secondary | ICD-10-CM | POA: Insufficient documentation

## 2017-12-14 DIAGNOSIS — K589 Irritable bowel syndrome without diarrhea: Secondary | ICD-10-CM | POA: Diagnosis not present

## 2017-12-14 DIAGNOSIS — R739 Hyperglycemia, unspecified: Secondary | ICD-10-CM | POA: Insufficient documentation

## 2017-12-14 DIAGNOSIS — I10 Essential (primary) hypertension: Secondary | ICD-10-CM | POA: Insufficient documentation

## 2017-12-14 DIAGNOSIS — G459 Transient cerebral ischemic attack, unspecified: Secondary | ICD-10-CM | POA: Diagnosis present

## 2017-12-14 DIAGNOSIS — K644 Residual hemorrhoidal skin tags: Secondary | ICD-10-CM | POA: Insufficient documentation

## 2017-12-14 DIAGNOSIS — I69398 Other sequelae of cerebral infarction: Secondary | ICD-10-CM | POA: Diagnosis not present

## 2017-12-14 DIAGNOSIS — I6523 Occlusion and stenosis of bilateral carotid arteries: Secondary | ICD-10-CM | POA: Insufficient documentation

## 2017-12-14 DIAGNOSIS — E871 Hypo-osmolality and hyponatremia: Secondary | ICD-10-CM | POA: Insufficient documentation

## 2017-12-14 DIAGNOSIS — Z79899 Other long term (current) drug therapy: Secondary | ICD-10-CM | POA: Insufficient documentation

## 2017-12-14 DIAGNOSIS — Z6834 Body mass index (BMI) 34.0-34.9, adult: Secondary | ICD-10-CM | POA: Insufficient documentation

## 2017-12-14 DIAGNOSIS — Z87891 Personal history of nicotine dependence: Secondary | ICD-10-CM | POA: Insufficient documentation

## 2017-12-14 DIAGNOSIS — Z881 Allergy status to other antibiotic agents status: Secondary | ICD-10-CM | POA: Insufficient documentation

## 2017-12-14 DIAGNOSIS — Z7982 Long term (current) use of aspirin: Secondary | ICD-10-CM | POA: Insufficient documentation

## 2017-12-14 DIAGNOSIS — E669 Obesity, unspecified: Secondary | ICD-10-CM | POA: Insufficient documentation

## 2017-12-14 DIAGNOSIS — R2689 Other abnormalities of gait and mobility: Secondary | ICD-10-CM | POA: Insufficient documentation

## 2017-12-14 DIAGNOSIS — K922 Gastrointestinal hemorrhage, unspecified: Secondary | ICD-10-CM

## 2017-12-14 DIAGNOSIS — F419 Anxiety disorder, unspecified: Secondary | ICD-10-CM | POA: Insufficient documentation

## 2017-12-14 LAB — DIFFERENTIAL
BASOS ABS: 0.1 10*3/uL (ref 0–0.1)
Basophils Relative: 1 %
Eosinophils Absolute: 0.2 10*3/uL (ref 0–0.7)
Eosinophils Relative: 2 %
LYMPHS PCT: 19 %
Lymphs Abs: 1.6 10*3/uL (ref 1.0–3.6)
MONOS PCT: 8 %
Monocytes Absolute: 0.6 10*3/uL (ref 0.2–1.0)
NEUTROS PCT: 70 %
Neutro Abs: 5.8 10*3/uL (ref 1.4–6.5)

## 2017-12-14 LAB — CBC
HEMATOCRIT: 43.9 % (ref 40.0–52.0)
HEMOGLOBIN: 15.3 g/dL (ref 13.0–18.0)
MCH: 31.4 pg (ref 26.0–34.0)
MCHC: 34.9 g/dL (ref 32.0–36.0)
MCV: 90 fL (ref 80.0–100.0)
Platelets: 275 10*3/uL (ref 150–440)
RBC: 4.88 MIL/uL (ref 4.40–5.90)
RDW: 13.4 % (ref 11.5–14.5)
WBC: 8.3 10*3/uL (ref 3.8–10.6)

## 2017-12-14 LAB — COMPREHENSIVE METABOLIC PANEL
ALBUMIN: 4.4 g/dL (ref 3.5–5.0)
ALK PHOS: 74 U/L (ref 38–126)
ALT: 27 U/L (ref 17–63)
AST: 26 U/L (ref 15–41)
Anion gap: 9 (ref 5–15)
BILIRUBIN TOTAL: 0.6 mg/dL (ref 0.3–1.2)
BUN: 14 mg/dL (ref 6–20)
CALCIUM: 9.8 mg/dL (ref 8.9–10.3)
CO2: 30 mmol/L (ref 22–32)
CREATININE: 1.22 mg/dL (ref 0.61–1.24)
Chloride: 94 mmol/L — ABNORMAL LOW (ref 101–111)
GFR calc Af Amer: >60 mL/min (ref >60)
GFR, EST NON AFRICAN AMERICAN: 57 mL/min — AB (ref >60)
GLUCOSE: 113 mg/dL — AB (ref 65–99)
Potassium: 3.3 mmol/L — ABNORMAL LOW (ref 3.5–5.1)
Sodium: 133 mmol/L — ABNORMAL LOW (ref 135–145)
TOTAL PROTEIN: 7 g/dL (ref 6.5–8.1)

## 2017-12-14 LAB — PROTIME-INR
INR: 0.87
Prothrombin Time: 11.8 seconds (ref 11.4–15.2)

## 2017-12-14 LAB — ETHANOL: Alcohol, Ethyl (B): <10 mg/dL (ref <10)

## 2017-12-14 LAB — TROPONIN I

## 2017-12-14 LAB — APTT: APTT: 28 s (ref 24–36)

## 2017-12-14 MED ORDER — IOPAMIDOL (ISOVUE-370) INJECTION 76%
100.0000 mL | Freq: Once | INTRAVENOUS | Status: AC | PRN
  Administered 2017-12-14: 100 mL via INTRAVENOUS

## 2017-12-14 NOTE — Progress Notes (Signed)
CODE STROKE- PHARMACY COMMUNICATION   Time CODE STROKE called/page received: 2300  Time response to CODE STROKE was made (in person or via phone):   Time Stroke Kit retrieved from [facility] (only if needed):  Name of Provider/Nurse contacted:  Past Medical History:  Diagnosis Date  . Cerebellar stroke ([place])   . Depression   . Hypertension   . IBS (irritable bowel syndrome)   . Stroke (cerebrum) ([place])   . Stroke ([place])   . Subdural hemorrhage ([place])    Prior to Admission medications   Medication Sig Start Date End Date Taking? Authorizing Provider  aspirin EC 81 MG EC tablet Take 1 tablet (81 mg total) by mouth daily. 07/26/15   Dustin Flock, MD  atorvastatin (LIPITOR) 40 MG tablet Take 1 tablet (40 mg total) by mouth daily at 6 PM. 07/26/15   Dustin Flock, MD  cetirizine (ZYRTEC) 10 MG tablet Take 10 mg by mouth daily.    [provider]  clopidogrel (PLAVIX) 75 MG tablet Take 1 tablet (75 mg total) by mouth daily. 07/26/15   Dustin Flock, MD  clorazepate (TRANXENE) 7.5 MG tablet Take 1 tablet by mouth daily as needed for anxiety.     [provider]  docusate sodium (COLACE) 100 MG capsule Take 100 mg by mouth 3 (three) times daily.     [provider]  escitalopram (LEXAPRO) 20 MG tablet Take 20 mg by mouth daily.    [provider]  esomeprazole (NEXIUM) 40 MG capsule Take 40 mg by mouth daily at 12 noon.    [provider]  hydrochlorothiazide (HYDRODIURIL) 25 MG tablet Take 25 mg by mouth daily.    [provider]  HYDROcodone-acetaminophen (NORCO/VICODIN) 5-325 MG tablet Take 1-2 tablets by mouth every 4 (four) hours as needed for moderate pain (Max 7 per day). 07/26/15   Dustin Flock, MD  mirtazapine (REMERON SOL-TAB) 30 MG disintegrating tablet Take 1 tablet by mouth daily.    [provider]  quinapril (ACCUPRIL) 40 MG tablet Take 40 mg by mouth at bedtime.    [provider]  traMADol (ULTRAM) 50 MG  tablet Take 1 tablet (50 mg total) by mouth every 6 (six) hours as needed. 05/23/17 05/23/18  Earleen Newport, MD  VESICARE 10 MG tablet Take 10 mg by mouth daily. 10/24/16   [provider]    Tobie Lords ,PharmD Clinical Pharmacist  12/14/2017  11:14 PM

## 2017-12-14 NOTE — Progress Notes (Signed)
   12/14/17 2306  Clinical Encounter Type  Visited With Patient;Family  Visit Type Code (code stroke)  Spiritual Encounters  Spiritual Needs Prayer;Emotional   Chaplain responded to code stroke, offered silent prayer for patient and care team.  Patient left to have CT scan.  Chaplain met wife in ED lobby, walked with her to consult room, let staff know her location, then returned to offer emotional support and build rapport.  Patient spouse spoke of volunteering at a wildlife refuge being important to self and patient.  Spouse reviewed events of this evening.  Chaplain offered to check if patient was back from scan, which he was.  Chaplain walked spouse back to patient room.  The two expressed desire for time together; chaplain encouraged them to have chaplain paged as needed.

## 2017-12-14 NOTE — ED Notes (Signed)
Patient transported to CT for perfusion study

## 2017-12-14 NOTE — ED Notes (Signed)
Maurice Lim, RN ordered CT with perfusion, CT contacted Dr Dolores Frame notified, Wife at bedside, per same reports bright red blood clumps at stool movement

## 2017-12-14 NOTE — ED Triage Notes (Signed)
Pt arrives via ems from home with reports of expressive aphasia. Ems states wife reported pt went into the bathroom and when pt came out he was having difficulties forming words and sentences. Ems states pt is alert and oriented at but when asked questions that require a sentence he has a hard time forming. Pt able to state name birthdate and month but unable to recall what year we are currently in.

## 2017-12-14 NOTE — ED Provider Notes (Signed)
[facility] Emergency Department Provider Note   ____________________________________________   First MD Initiated Contact with Patient 12/14/17 2306     (approximate)  I have reviewed the triage vital signs and the nursing notes.   HISTORY  Chief Complaint Aphasia  Level 5 caveat: Patient with speech and cognitive difficulty; history obtained via spouse  HPI Maurice Moses is a 72 y.o. male brought to the ED from home via EMS with a chief complaint of expressive aphasia.  Wife states that approximately 9:30 PM, patient went into the restroom and when he came out he was having difficulty forming his words and sentences.  She states that some of what he was saying sounded like gibberish.  Denies slurred speech or extremity weakness/numbness/tingling.  States at baseline sometimes patient has difficulty expressing what he wants to say and is at times confused but tonight's episode was worse.  Denies recent fever, chills, chest pain, shortness of breath, abdominal pain, nausea, vomiting.  Denies recent travel or trauma.  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  Currently on Plavix for history of stroke.   Past Medical History:  Diagnosis Date  . Cerebellar stroke (HCC)   . Depression   . Hypertension   . IBS (irritable bowel syndrome)   . Stroke (cerebrum) (HCC)   . Stroke (HCC)   . Subdural hemorrhage [facility])     Patient Active Problem List   Diagnosis Date Noted  . CVA (cerebral vascular accident) (HCC) 11/26/2016  . 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 TIA (transient ischemic attack) 07/23/2015    Past Surgical History:  Procedure Laterality Date  . LOOP RECORDER INSERTION      Prior to Admission medications   Medication Sig Start Date End Date Taking? Authorizing Provider  aspirin EC 81 MG EC tablet Take 1 tablet (81 mg total) by mouth daily. 07/26/15  Yes Auburn Bilberry, MD  atorvastatin (LIPITOR) 40 MG tablet Take 1 tablet (40  mg total) by mouth daily at 6 PM. 07/26/15  Yes Auburn Bilberry, MD  cetirizine (ZYRTEC) 10 MG tablet Take 10 mg by mouth daily.   Yes [provider]  cholecalciferol (VITAMIN D) 1000 units tablet Take 2,000 Units by mouth daily.   Yes [provider]  clopidogrel (PLAVIX) 75 MG tablet Take 1 tablet (75 mg total) by mouth daily. 07/26/15  Yes Auburn Bilberry, MD  clorazepate (TRANXENE) 7.5 MG tablet Take 1 tablet by mouth daily as needed for anxiety.    Yes [provider]  docusate sodium (COLACE) 100 MG capsule Take 100 mg by mouth 3 (three) times daily.    Yes [provider]  escitalopram (LEXAPRO) 20 MG tablet Take 20 mg by mouth daily.   Yes [provider]  esomeprazole (NEXIUM) 40 MG capsule Take 40 mg by mouth daily at 12 noon.   Yes [provider]  hydrochlorothiazide (HYDRODIURIL) 25 MG tablet Take 25 mg by mouth daily.   Yes [provider]  HYDROcodone-acetaminophen (NORCO/VICODIN) 5-325 MG tablet Take 1-2 tablets by mouth every 4 (four) hours as needed for moderate pain (Max 7 per day). 07/26/15  Yes Auburn Bilberry, MD  mirtazapine (REMERON SOL-TAB) 30 MG disintegrating tablet Take 1 tablet by mouth daily.   Yes [provider]  Omega-3 Fatty Acids (FISH OIL) 1000 MG CAPS Take 1,000 mg by mouth every morning.   Yes [provider]  quinapril (ACCUPRIL) 40 MG tablet Take 40 mg by mouth at bedtime.  Yes [provider]  VESICARE 10 MG tablet Take 10 mg by mouth daily. 10/24/16  Yes [provider]  traMADol (ULTRAM) 50 MG tablet Take 1 tablet (50 mg total) by mouth every 6 (six) hours as needed. Patient not taking: Reported on 12/14/2017 05/23/17 05/23/18  Emily Filbert, MD    Allergies Doxycycline and Morphine and related  Family History  Problem Relation Age of Onset  . Hypertension Other     Social History Social History   Tobacco Use  . Smoking status: Former Games developer  .  Smokeless tobacco: Never Used  Substance Use Topics  . Alcohol use: Yes    Comment: occ  . Drug use: No    Review of Systems  Constitutional: No fever/chills. Eyes: No visual changes. ENT: No sore throat. Cardiovascular: Denies chest pain. Respiratory: Denies shortness of breath. Gastrointestinal: No abdominal pain.  No nausea, no vomiting.  No diarrhea.  No constipation. Genitourinary: Negative for dysuria. Musculoskeletal: Negative for back pain. Skin: Negative for rash. Neurological: Positive for expressive aphasia.  Negative for headaches, focal weakness or numbness.   ____________________________________________   PHYSICAL EXAM:  VITAL SIGNS: ED Triage Vitals  Enc Vitals Group     BP 12/14/17 2304 (!) 169/108     Pulse Rate 12/14/17 2304 (!) 103     Resp 12/14/17 2304 (!) 23     Temp 12/14/17 2304 98.6 F (37 C)     Temp Source 12/14/17 2304 Oral     SpO2 12/14/17 2304 91 %     Weight 12/14/17 2306 230 lb (104.3 kg)     Height 12/14/17 2306 5\' 9"  (1.753 m)     Head Circumference --      Peak Flow --      Pain Score 12/14/17 2305 0     Pain Loc --      Pain Edu? --      Excl. in GC? --     Constitutional: Alert and oriented. Well appearing and in no acute distress. Eyes: Conjunctivae are normal. PERRL. EOMI. Head: Atraumatic. Nose: No congestion/rhinnorhea. Mouth/Throat: Mucous membranes are moist.  Oropharynx non-erythematous. Neck: No stridor.   Cardiovascular: Normal rate, regular rhythm. Grossly normal heart sounds.  Good peripheral circulation. Respiratory: Normal respiratory effort.  No retractions. Lungs CTAB. Gastrointestinal: Soft and nontender. No distention. No abdominal bruits. No CVA tenderness. Musculoskeletal: No lower extremity tenderness nor edema.  No joint effusions. Neurologic: Delayed speech and language.  Mild aphasia noted.  No gross focal neurologic deficits are appreciated. MAEx4. Skin:  Skin is warm, dry and intact. No rash noted.   No petechiae. Psychiatric: Mood and affect are normal. Speech and behavior are normal.  ____________________________________________   LABS (all labs ordered are listed, but only abnormal results are displayed)  Labs Reviewed  COMPREHENSIVE METABOLIC PANEL - Abnormal; Notable for the following components:      Result Value   Sodium 133 (*)    Potassium 3.3 (*)    Chloride 94 (*)    Glucose, Bld 113 (*)    GFR calc non Af Amer 57 (*)    All other components within normal limits  PROTIME-INR  APTT  CBC  DIFFERENTIAL  TROPONIN I  ETHANOL  URINE DRUG SCREEN, QUALITATIVE ([facility] ONLY)  URINALYSIS, ROUTINE W REFLEX MICROSCOPIC   ____________________________________________  EKG  ED ECG REPORT I, Loletta Harper J, the attending physician, personally viewed and interpreted this ECG.   Date: 12/14/2017  EKG Time: 2258  Rate: 104  Rhythm: normal EKG, normal sinus rhythm  Axis: Normal  Intervals:none  ST&T Change: Nonspecific  ____________________________________________  RADIOLOGY  ED MD interpretation: CT head without contrast negative for ICH; negative CTA for emergent large vessel occlusion, no infarct   Official radiology report(s): Ct Angio Head W Or Wo Contrast  Result Date: 12/15/2017 CLINICAL DATA:  Initial evaluation for acute stroke, expressive aphasia. EXAM: CT ANGIOGRAPHY HEAD AND NECK CT PERFUSION BRAIN TECHNIQUE: Multidetector CT imaging of the head and neck was performed using the standard protocol during bolus administration of intravenous contrast. Multiplanar CT image reconstructions and MIPs were obtained to evaluate the vascular anatomy. Carotid stenosis measurements (when applicable) are obtained utilizing NASCET criteria, using the distal internal carotid diameter as the denominator. Multiphase CT imaging of the brain was performed following IV bolus contrast injection. Subsequent parametric perfusion maps were calculated using RAPID software. CONTRAST:   ISOVUE-370 IOPAMIDOL (ISOVUE-370) INJECTION 76% COMPARISON:  Prior CT from earlier the same day. FINDINGS: CTA NECK FINDINGS Aortic arch: Visualized aortic arch of normal caliber with normal 3 vessel morphology. No flow-limiting stenosis about the origin of the great vessels. Visualized subclavian arteries widely patent. Right carotid system: Right common carotid artery patent from its origin to the bifurcation without stenosis. Minimal atheromatous plaque about the right bifurcation without hemodynamically significant stenosis. Right ICA widely patent distally to the skull base without stenosis, dissection, or occlusion. Left carotid system: Left common carotid artery patent from its origin to the bifurcation without stenosis. Calcified atheromatous plaque about the left bifurcation with associated short-segment stenosis of up to approximately 50% by NASCET criteria. Left ICA patent distally to the skull base without stenosis, dissection, or occlusion. Vertebral arteries: Both of the vertebral arteries arise from the subclavian arteries. Focal plaque at the origin of the vertebral arteries bilaterally with secondary mild to moderate stenosis, slightly worse on the right. Vertebral arteries otherwise widely patent within the neck without stenosis, dissection, or occlusion. Left vertebral artery slightly dominant. Skeleton: No acute osseus abnormality. No worrisome lytic or blastic osseous lesions. Moderate degenerative spondylolysis present at C5-6 and C6-7. Other neck: No acute soft tissue abnormality within the neck. Salivary glands within normal limits. No adenopathy. Thyroid normal. Upper chest: Visualized upper chest within normal limits. Visualized lungs are clear. Review of the MIP images confirms the above findings CTA HEAD FINDINGS Anterior circulation: Petrous segments widely patent bilaterally. Atheromatous plaque within the cavernous/supraclinoid ICAs with resultant moderate to severe stenosis on the  right (series 7, image 118), with relatively mild stenosis on the left. Origin of the ophthalmic arteries patent. ICA termini widely patent. A1 segments, anterior communicating artery common anterior cerebral arteries widely patent and within normal limits. No M1 segment occlusion or stenosis. No proximal M2 occlusion. Distal MCA branches well perfused and symmetric. Posterior circulation: Vertebral arteries widely patent to the vertebrobasilar junction. Posterior inferior cerebral arteries patent bilaterally. Basilar artery widely patent to its distal aspect. Superior cerebral arteries patent bilaterally. Both of the posterior cerebral arteries primarily supplied via the basilar and are well perfused to their distal aspects. Moderate to severe tandem proximal and mid left P2 stenoses noted. No significant stenosis on the right. Small right posterior communicating artery noted. Venous sinuses: Patent. Anatomic variants: No significant anatomic variant. No aneurysm or vascular abnormality. Delayed phase: Not performed. Review of the MIP images confirms the above findings CT Brain Perfusion Findings: CBF (<30%) Volume: 0mL Perfusion (Tmax>6.0s) volume: 5mL Mismatch Volume: 5mL Infarction Location:No acute infarct by CT perfusion. Mildly delayed  perfusion within the left temporal region suspected to be related to left ICA stenosis. IMPRESSION: 1. Negative CTA for emergent large vessel occlusion. 2. No acute infarct by CT perfusion. 3. Atheromatous plaque about the left carotid bifurcation with associated stenosis of up to 50% by NASCET criteria. 4. 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 5. Moderate to severe tandem proximal-mid left P2 stenoses. Electronically Signed   By: Rise Mu M.D.   On: 12/15/2017 00:46   Ct Angio Neck W And/or Wo Contrast  Result Date: 12/15/2017 CLINICAL DATA:  Initial evaluation for acute stroke, expressive aphasia. EXAM: CT  ANGIOGRAPHY HEAD AND NECK CT PERFUSION BRAIN TECHNIQUE: Multidetector CT imaging of the head and neck was performed using the standard protocol during bolus administration of intravenous contrast. Multiplanar CT image reconstructions and MIPs were obtained to evaluate the vascular anatomy. Carotid stenosis measurements (when applicable) are obtained utilizing NASCET criteria, using the distal internal carotid diameter as the denominator. Multiphase CT imaging of the brain was performed following IV bolus contrast injection. Subsequent parametric perfusion maps were calculated using RAPID software. CONTRAST:  ISOVUE-370 IOPAMIDOL (ISOVUE-370) INJECTION 76% COMPARISON:  Prior CT from earlier the same day. FINDINGS: CTA NECK FINDINGS Aortic arch: Visualized aortic arch of normal caliber with normal 3 vessel morphology. No flow-limiting stenosis about the origin of the great vessels. Visualized subclavian arteries widely patent. Right carotid system: Right common carotid artery patent from its origin to the bifurcation without stenosis. Minimal atheromatous plaque about the right bifurcation without hemodynamically significant stenosis. Right ICA widely patent distally to the skull base without stenosis, dissection, or occlusion. Left carotid system: Left common carotid artery patent from its origin to the bifurcation without stenosis. Calcified atheromatous plaque about the left bifurcation with associated short-segment stenosis of up to approximately 50% by NASCET criteria. Left ICA patent distally to the skull base without stenosis, dissection, or occlusion. Vertebral arteries: Both of the vertebral arteries arise from the subclavian arteries. Focal plaque at the origin of the vertebral arteries bilaterally with secondary mild to moderate stenosis, slightly worse on the right. Vertebral arteries otherwise widely patent within the neck without stenosis, dissection, or occlusion. Left vertebral artery slightly  dominant. Skeleton: No acute osseus abnormality. No worrisome lytic or blastic osseous lesions. Moderate degenerative spondylolysis present at C5-6 and C6-7. Other neck: No acute soft tissue abnormality within the neck. Salivary glands within normal limits. No adenopathy. Thyroid normal. Upper chest: Visualized upper chest within normal limits. Visualized lungs are clear. Review of the MIP images confirms the above findings CTA HEAD FINDINGS Anterior circulation: Petrous segments widely patent bilaterally. Atheromatous plaque within the cavernous/supraclinoid ICAs with resultant moderate to severe stenosis on the right (series 7, image 118), with relatively mild stenosis on the left. Origin of the ophthalmic arteries patent. ICA termini widely patent. A1 segments, anterior communicating artery common anterior cerebral arteries widely patent and within normal limits. No M1 segment occlusion or stenosis. No proximal M2 occlusion. Distal MCA branches well perfused and symmetric. Posterior circulation: Vertebral arteries widely patent to the vertebrobasilar junction. Posterior inferior cerebral arteries patent bilaterally. Basilar artery widely patent to its distal aspect. Superior cerebral arteries patent bilaterally. Both of the posterior cerebral arteries primarily supplied via the basilar and are well perfused to their distal aspects. Moderate to severe tandem proximal and mid left P2 stenoses noted. No significant stenosis on the right. Small right posterior communicating artery noted. Venous sinuses: Patent. Anatomic variants: No significant anatomic variant.  No aneurysm or vascular abnormality. Delayed phase: Not performed. Review of the MIP images confirms the above findings CT Brain Perfusion Findings: CBF (<30%) Volume: 0mL Perfusion (Tmax>6.0s) volume: 5mL Mismatch Volume: 5mL Infarction Location:No acute infarct by CT perfusion. Mildly delayed perfusion within the left temporal region suspected to be related  to left ICA stenosis. IMPRESSION: 1. Negative CTA for emergent large vessel occlusion. 2. No acute infarct by CT perfusion. 3. Atheromatous plaque about the left carotid bifurcation with associated stenosis of up to 50% by NASCET criteria. 4. 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 5. Moderate to severe tandem proximal-mid left P2 stenoses. Electronically Signed   By: Rise Mu M.D.   On: 12/15/2017 00:46   Ct Cerebral Perfusion W Contrast  Result Date: 12/15/2017 CLINICAL DATA:  Initial evaluation for acute stroke, expressive aphasia. EXAM: CT ANGIOGRAPHY HEAD AND NECK CT PERFUSION BRAIN TECHNIQUE: Multidetector CT imaging of the head and neck was performed using the standard protocol during bolus administration of intravenous contrast. Multiplanar CT image reconstructions and MIPs were obtained to evaluate the vascular anatomy. Carotid stenosis measurements (when applicable) are obtained utilizing NASCET criteria, using the distal internal carotid diameter as the denominator. Multiphase CT imaging of the brain was performed following IV bolus contrast injection. Subsequent parametric perfusion maps were calculated using RAPID software. CONTRAST:  ISOVUE-370 IOPAMIDOL (ISOVUE-370) INJECTION 76% COMPARISON:  Prior CT from earlier the same day. FINDINGS: CTA NECK FINDINGS Aortic arch: Visualized aortic arch of normal caliber with normal 3 vessel morphology. No flow-limiting stenosis about the origin of the great vessels. Visualized subclavian arteries widely patent. Right carotid system: Right common carotid artery patent from its origin to the bifurcation without stenosis. Minimal atheromatous plaque about the right bifurcation without hemodynamically significant stenosis. Right ICA widely patent distally to the skull base without stenosis, dissection, or occlusion. Left carotid system: Left common carotid artery patent from its origin to the  bifurcation without stenosis. Calcified atheromatous plaque about the left bifurcation with associated short-segment stenosis of up to approximately 50% by NASCET criteria. Left ICA patent distally to the skull base without stenosis, dissection, or occlusion. Vertebral arteries: Both of the vertebral arteries arise from the subclavian arteries. Focal plaque at the origin of the vertebral arteries bilaterally with secondary mild to moderate stenosis, slightly worse on the right. Vertebral arteries otherwise widely patent within the neck without stenosis, dissection, or occlusion. Left vertebral artery slightly dominant. Skeleton: No acute osseus abnormality. No worrisome lytic or blastic osseous lesions. Moderate degenerative spondylolysis present at C5-6 and C6-7. Other neck: No acute soft tissue abnormality within the neck. Salivary glands within normal limits. No adenopathy. Thyroid normal. Upper chest: Visualized upper chest within normal limits. Visualized lungs are clear. Review of the MIP images confirms the above findings CTA HEAD FINDINGS Anterior circulation: Petrous segments widely patent bilaterally. Atheromatous plaque within the cavernous/supraclinoid ICAs with resultant moderate to severe stenosis on the right (series 7, image 118), with relatively mild stenosis on the left. Origin of the ophthalmic arteries patent. ICA termini widely patent. A1 segments, anterior communicating artery common anterior cerebral arteries widely patent and within normal limits. No M1 segment occlusion or stenosis. No proximal M2 occlusion. Distal MCA branches well perfused and symmetric. Posterior circulation: Vertebral arteries widely patent to the vertebrobasilar junction. Posterior inferior cerebral arteries patent bilaterally. Basilar artery widely patent to its distal aspect. Superior cerebral arteries patent bilaterally. Both of the posterior cerebral arteries primarily supplied via the basilar  and are well perfused  to their distal aspects. Moderate to severe tandem proximal and mid left P2 stenoses noted. No significant stenosis on the right. Small right posterior communicating artery noted. Venous sinuses: Patent. Anatomic variants: No significant anatomic variant. No aneurysm or vascular abnormality. Delayed phase: Not performed. Review of the MIP images confirms the above findings CT Brain Perfusion Findings: CBF (<30%) Volume: 0mL Perfusion (Tmax>6.0s) volume: 5mL Mismatch Volume: 5mL Infarction Location:No acute infarct by CT perfusion. Mildly delayed perfusion within the left temporal region suspected to be related to left ICA stenosis. IMPRESSION: 1. Negative CTA for emergent large vessel occlusion. 2. No acute infarct by CT perfusion. 3. Atheromatous plaque about the left carotid bifurcation with associated stenosis of up to 50% by NASCET criteria. 4. 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 5. Moderate to severe tandem proximal-mid left P2 stenoses. Electronically Signed   By: Rise Mu M.D.   On: 12/15/2017 00:46   Ct Head Code Stroke Wo Contrast  Result Date: 12/14/2017 CLINICAL DATA:  Code stroke. Initial evaluation for acute expressive aphasia. EXAM: CT HEAD WITHOUT CONTRAST TECHNIQUE: Contiguous axial images were obtained from the base of the skull through the vertex without intravenous contrast. COMPARISON:  Prior CT from 05/23/2017. FINDINGS: Brain: Atrophy with chronic small vessel ischemic disease. Scatter remote bilateral cerebellar infarcts, left greater than right. No acute intracranial hemorrhage. No acute large vessel territory infarct. No mass lesion, midline shift or mass effect. Diffuse ventricular prominence related global parenchymal volume loss of hydrocephalus. No extra-axial fluid collection. Vascular: No asymmetric hyperdense vessel. Scattered vascular calcifications noted within the carotid siphons. Skull: Scalp soft tissues and  calvarium within normal limits. Sinuses/Orbits: Globes and orbital soft tissues within normal limits. Mild scattered mucosal thickening within the ethmoidal air cells. Paranasal sinuses are otherwise clear. Small right mastoid effusion noted, of doubtful significance. Other: None. ASPECTS [facility] Stroke Program Early CT Score) - Ganglionic level infarction (caudate, lentiform nuclei, internal capsule, insula, M1-M3 cortex): 7 - Supraganglionic infarction (M4-M6 cortex): 3 Total score (0-10 with 10 being normal): 10 IMPRESSION: 1. No acute intracranial infarct or other process identified. 2. ASPECTS is 10. 3. 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 Critical Value/emergent results were called by telephone at the time of interpretation on 12/14/2017 at 11:33 pm to Dr. Lesly Rubenstein Akaila Rambo , who verbally acknowledged these results. Electronically Signed   By: Rise Mu M.D.   On: 12/14/2017 23:34    ____________________________________________   PROCEDURES  Procedure(s) performed:   Interval: Initial (04/27 2314) Level of Consciousness (1a.)   : Alert, keenly responsive (04/27 2314) LOC Questions (1b. )   +: Answers neither question correctly (04/27 2314) LOC Commands (1c. )   + : Performs both tasks correctly (04/27 2314) Best Gaze (2. )  +: Normal (04/27 2314) Visual (3. )  +: No visual loss (04/27 2314) Facial Palsy (4. )    : Normal symmetrical movements (04/27 2314) Motor Arm, Left (5a. )   +: No drift (04/27 2314) Motor Arm, Right (5b. )   +: No drift (04/27 2314) Motor Leg, Left (6a. )   +: No drift (04/27 2314) Motor Leg, Right (6b. )   +: No drift (04/27 2314) Limb Ataxia (7. ): Absent (04/27 2314) Sensory (8. )   +: Normal, no sensory loss (04/27 2314) Best Language (9. )   +: Mild-to-moderate aphasia (04/27 2314) Dysarthria (10. ): Normal (04/27 2314) Extinction/Inattention (11.)   +:  No Abnormality (04/27 2314) Total: 3 (04/27  2314)   Procedures  Critical Care performed: Yes, see critical care note(s)   CRITICAL CARE Performed by: Irean Hong   Total critical care time: 45 minutes  Critical care time was exclusive of separately billable procedures and treating other patients.  Critical care was necessary to treat or prevent imminent or life-threatening deterioration.  Critical care was time spent personally by me on the following activities: development of treatment plan with patient and/or surrogate as well as nursing, discussions with consultants, evaluation of patient's response to treatment, examination of patient, obtaining history from patient or surrogate, ordering and performing treatments and interventions, ordering and review of laboratory studies, ordering and review of radiographic studies, pulse oximetry and re-evaluation of patient's condition.  ____________________________________________   INITIAL IMPRESSION / ASSESSMENT AND PLAN / ED COURSE  As part of my medical decision making, I reviewed the following data within the electronic MEDICAL RECORD NUMBER History obtained from family, Nursing notes reviewed and incorporated, Labs reviewed, EKG interpreted, Old chart reviewed, Radiograph reviewed, Discussed with admitting physician, A consult was requested and obtained from this/these consultant(s) Neurology and Notes from prior ED visits   72 year old male with prior history of subdural hematoma as well as CVA, currently on Plavix, who presents with expressive aphasia approximately 1.5 hours ago.  Differential diagnosis includes but is not limited to intracranial hemorrhage, CVA, TIA, hypoglycemia, infection, etc.   Given time of onset less than 3 hours with focal neurological deficit, ED code stroke was called upon patient's arrival to the emergency department.  Patient was sent emergently for CT head without contrast.  Tele-neurology was contacted.   Clinical Course as of Dec 15 98  Sat Dec 14, 2017  2331 Tele-neurology nurse speaking with patient now.  She has recommended CT perfusion study of the head.  Wife is now at bedside.  States speech has improved.   [JS]  2337 Spoke with radiologist; CT head negative for intracranial hemorrhage.   [JS]  Sun Dec 15, 2017  7425 Discussed with tele-neurologist Dr. Domingo Sep who does not feel patient would benefit from TPA as his symptoms have improved and are resolved.  Recommends holding antiplatelet therapy because patient reports he has been seeing bright red blood per rectum.  Rectal exam reveals large external hemorrhoid which is nonthrombosed.  Bloody streaks on gloved finger which are immediately heme positive.  Discussed with hospitalist who will evaluate patient in emergency department for admission.   [JS]    Clinical Course User Index [JS] Irean Hong, MD     ____________________________________________   FINAL CLINICAL IMPRESSION(S) / ED DIAGNOSES  Final diagnoses:  TIA (transient ischemic attack)  Gastrointestinal hemorrhage, unspecified gastrointestinal hemorrhage type     ED Discharge Orders    None       Note:  This document was prepared using Dragon voice recognition software and may include unintentional dictation errors.    Irean Hong, MD 12/15/17 [phone]

## 2017-12-15 ENCOUNTER — Encounter: Payer: Self-pay | Admitting: Internal Medicine

## 2017-12-15 ENCOUNTER — Other Ambulatory Visit: Payer: Self-pay

## 2017-12-15 DIAGNOSIS — G459 Transient cerebral ischemic attack, unspecified: Secondary | ICD-10-CM

## 2017-12-15 LAB — URINALYSIS, ROUTINE W REFLEX MICROSCOPIC
Bilirubin Urine: NEGATIVE
GLUCOSE, UA: NEGATIVE mg/dL
HGB URINE DIPSTICK: NEGATIVE
Ketones, ur: NEGATIVE mg/dL
LEUKOCYTES UA: NEGATIVE
Nitrite: NEGATIVE
Protein, ur: NEGATIVE mg/dL
SPECIFIC GRAVITY, URINE: 1.024 (ref 1.005–1.030)
pH: 7 (ref 5.0–8.0)

## 2017-12-15 LAB — LIPID PANEL
CHOLESTEROL: 151 mg/dL (ref 0–200)
HDL: 42 mg/dL (ref >40)
LDL Cholesterol: 54 mg/dL (ref 0–99)
TRIGLYCERIDES: 274 mg/dL — AB (ref <150)
Total CHOL/HDL Ratio: 3.6 RATIO
VLDL: 55 mg/dL — AB (ref 0–40)

## 2017-12-15 LAB — URINE DRUG SCREEN, QUALITATIVE (ARMC ONLY)
AMPHETAMINES, UR SCREEN: NOT DETECTED
Barbiturates, Ur Screen: NOT DETECTED
Benzodiazepine, Ur Scrn: NOT DETECTED
Cannabinoid 50 Ng, Ur ~~LOC~~: NOT DETECTED
Cocaine Metabolite,Ur ~~LOC~~: NOT DETECTED
MDMA (ECSTASY) UR SCREEN: NOT DETECTED
Methadone Scn, Ur: NOT DETECTED
OPIATE, UR SCREEN: POSITIVE — AB
PHENCYCLIDINE (PCP) UR S: NOT DETECTED
Tricyclic, Ur Screen: NOT DETECTED

## 2017-12-15 MED ORDER — ACETAMINOPHEN 160 MG/5ML PO SOLN
650.0000 mg | ORAL | Status: DC | PRN
  Filled 2017-12-15: qty 20.3

## 2017-12-15 MED ORDER — ENOXAPARIN SODIUM 40 MG/0.4ML ~~LOC~~ SOLN
40.0000 mg | SUBCUTANEOUS | Status: DC
  Administered 2017-12-15: 40 mg via SUBCUTANEOUS
  Filled 2017-12-15: qty 0.4

## 2017-12-15 MED ORDER — DARIFENACIN HYDROBROMIDE ER 15 MG PO TB24
15.0000 mg | ORAL_TABLET | Freq: Every day | ORAL | Status: DC
  Filled 2017-12-15: qty 1

## 2017-12-15 MED ORDER — KCL-LACTATED RINGERS 20 MEQ/L IV SOLN
INTRAVENOUS | Status: DC
  Filled 2017-12-15: qty 1000

## 2017-12-15 MED ORDER — ACETAMINOPHEN 325 MG PO TABS: 650.0000 mg | ORAL_TABLET | ORAL | Status: DC | PRN

## 2017-12-15 MED ORDER — ACETAMINOPHEN 650 MG RE SUPP: 650.0000 mg | RECTAL | Status: DC | PRN

## 2017-12-15 MED ORDER — POTASSIUM CHLORIDE CRYS ER 20 MEQ PO TBCR
40.0000 meq | EXTENDED_RELEASE_TABLET | Freq: Once | ORAL | Status: AC
  Administered 2017-12-15: 40 meq via ORAL

## 2017-12-15 MED ORDER — POTASSIUM CHLORIDE CRYS ER 20 MEQ PO TBCR
EXTENDED_RELEASE_TABLET | ORAL | Status: AC
  Administered 2017-12-15: 40 meq via ORAL
  Filled 2017-12-15: qty 2

## 2017-12-15 MED ORDER — QUINAPRIL HCL 10 MG PO TABS
40.0000 mg | ORAL_TABLET | Freq: Every day | ORAL | Status: DC
  Administered 2017-12-15: 40 mg via ORAL
  Filled 2017-12-15 (×2): qty 4

## 2017-12-15 MED ORDER — MIRTAZAPINE 30 MG PO TBDP
30.0000 mg | ORAL_TABLET | Freq: Every day | ORAL | Status: DC
  Filled 2017-12-15: qty 1

## 2017-12-15 MED ORDER — PANTOPRAZOLE SODIUM 40 MG PO TBEC
40.0000 mg | DELAYED_RELEASE_TABLET | Freq: Every day | ORAL | Status: DC
  Filled 2017-12-15: qty 1

## 2017-12-15 MED ORDER — HYDROCODONE-ACETAMINOPHEN 5-325 MG PO TABS
1.0000 | ORAL_TABLET | ORAL | Status: DC | PRN
  Administered 2017-12-15: 2 via ORAL
  Filled 2017-12-15: qty 2

## 2017-12-15 MED ORDER — ATORVASTATIN CALCIUM 20 MG PO TABS: 40.0000 mg | ORAL_TABLET | Freq: Every day | ORAL | Status: DC

## 2017-12-15 MED ORDER — STROKE: EARLY STAGES OF RECOVERY BOOK: Freq: Once | Status: DC

## 2017-12-15 MED ORDER — ESCITALOPRAM OXALATE 20 MG PO TABS
20.0000 mg | ORAL_TABLET | Freq: Every day | ORAL | Status: DC
  Filled 2017-12-15: qty 1

## 2017-12-15 MED ORDER — SENNOSIDES-DOCUSATE SODIUM 8.6-50 MG PO TABS: 1.0000 | ORAL_TABLET | Freq: Every evening | ORAL | Status: DC | PRN

## 2017-12-15 MED ORDER — ASPIRIN EC 81 MG PO TBEC
81.0000 mg | DELAYED_RELEASE_TABLET | Freq: Every day | ORAL | Status: DC
  Filled 2017-12-15: qty 1

## 2017-12-15 MED ORDER — HYDROCHLOROTHIAZIDE 25 MG PO TABS: 25.0000 mg | ORAL_TABLET | Freq: Every day | ORAL | Status: DC

## 2017-12-15 MED ORDER — POTASSIUM CHLORIDE IN NACL 20-0.9 MEQ/L-% IV SOLN
INTRAVENOUS | Status: DC
  Administered 2017-12-15: 04:00:00 via INTRAVENOUS
  Filled 2017-12-15 (×3): qty 1000

## 2017-12-15 MED ORDER — BISACODYL 5 MG PO TBEC: 5.0000 mg | DELAYED_RELEASE_TABLET | Freq: Every day | ORAL | Status: DC | PRN

## 2017-12-15 MED ORDER — CLORAZEPATE DIPOTASSIUM 7.5 MG PO TABS: 7.5000 mg | ORAL_TABLET | Freq: Every day | ORAL | Status: DC | PRN

## 2017-12-15 MED ORDER — VITAMIN D 1000 UNITS PO TABS
2000.0000 [IU] | ORAL_TABLET | Freq: Every day | ORAL | Status: DC
  Filled 2017-12-15: qty 2

## 2017-12-15 NOTE — H&P (Signed)
[employer] - [place] at [facility]   PATIENT NAME: Maurice Moses    MR#:  [id number]  DATE OF BIRTH:  04/10/1946  DATE OF ADMISSION:  12/14/2017  PRIMARY CARE PHYSICIAN: Linfors, Leane Call, MD   REQUESTING/REFERRING PHYSICIAN:  Irean Hong, MD   CHIEF COMPLAINT:   Chief Complaint  Patient presents with  . Aphasia    HISTORY OF PRESENT ILLNESS:  Maurice Moses  is a 72 y.o. 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 Pt's wife is at bedside, and assists in obtaining Hx. Pt states that on Saturday (12/14/2017) evening, he went to use the bathroom, and had a bowel movement. He noticed pink/light red blood and small clots in the toilet bowl water. He walked out of the bathroom into the den. He states he tried to talk to his wife, but he was unable to get the words out, and he tells me he, "felt weird". His wife says that he would say several words, and then be unable to complete his sentence. She states this occurred @~2130PM. She did not notice any slurred speech or facial droop. The pt himself recalls the entire episode, and denies vertigo, LH, LOC, dysequilibrium or acute changes in hearing/vision. The pt's wife called 911, and the operator asked the pt to repeat a sentence, which the pt was able to do without difficulty. An ambulance was sent to the house nonetheless. Pt's wife checked his BP while waiting for EMS, 180s/120s (normal 120s/80s on medication). EMS arrived, by which point pt's symptoms had resolved. Pt was subsequently brought to the hospital via ambulance. CT perfusion study of the brain performed in ED (-). Tele-radiologist (Dr. Sheria Lang) contacted, and did not recommend tPA, but did recommend holding Plavix, performing brain MRI and obtaining GI and Neurology evaluations.  At the time of my  Hx/examination, pt is well-appearing, and in no distress. He has some mild impairments in memory/cognition per his wife, and he is able to tell me the month (April), the city/town [place]) and the current POTUS (Trump), but cannot recall the year (2019) or the name of the hospital. He is otherwise neurologically non-focal. He is otherwise without complaint. He denies F/C/N/V/D/AP, CP, SOB, palpitations, diaphoresis, night sweats, rigors, cough, hemoptysis, wheezing, urinary symptoms. He uses a cane to ambulate at baseline. His wife states he does not fall frequently, but he has fallen twice so far in 2019. He has not sustained any major injuries necessitating an ED visit or hospitalization. Pt's Cardiologist is Dr. Danelle Earthly. Pt has a implantable cardiac loop recorder (Reveal ICM, Model # [id number], Serial # [id number]), placed 10/27/2015, shortly after his cerebellar CVA. Pt's Gastroenterologist is Dr. Ramiro Harvest Singletary. Pt had colonoscopy on 08/23/2012, (+) hemorrhoids. Pt was noted to have a large non-thrombosed external hemorrhoid (w/ blood streaks) on ED examination. Hgb 15.3 on present admission.  PAST MEDICAL HISTORY:   Past Medical History:  Diagnosis Date  . Cerebellar stroke (HCC)   . Depression   . Hypertension   . IBS (irritable bowel syndrome)   . Stroke (cerebrum) (HCC)   . Stroke (HCC)   . Subdural hemorrhage (HCC)     PAST SURGICAL HISTORY:   Past Surgical History:  Procedure Laterality Date  . LOOP RECORDER INSERTION      SOCIAL HISTORY:   Social History   Tobacco Use  .  Smoking status: Former Games developer  . Smokeless tobacco: Never Used  Substance Use Topics  . Alcohol use: Yes    Comment: occ    FAMILY HISTORY:   Family History  Problem Relation Age of Onset  . Hypertension Other     DRUG ALLERGIES:   Allergies  Allergen Reactions  . Doxycycline Nausea And Vomiting  . Morphine And Related Other (See Comments)    Agitation - wife does not want him to  have it    REVIEW OF SYSTEMS:   Review of Systems  Constitutional: Negative for chills, diaphoresis, fever, malaise/fatigue and weight loss.  HENT: Negative for congestion, hearing loss, sinus pain, sore throat and tinnitus.   Eyes: Negative for blurred vision, double vision and photophobia.  Respiratory: Negative for cough, hemoptysis, sputum production, shortness of breath and wheezing.   Cardiovascular: Negative for chest pain, palpitations, orthopnea, claudication, leg swelling and PND.  Gastrointestinal: Negative for abdominal pain, blood in stool, constipation, diarrhea, heartburn, melena, nausea and vomiting.  Genitourinary: Negative for dysuria, frequency, hematuria and urgency.  Musculoskeletal: Positive for falls. Negative for back pain, joint pain, myalgias and neck pain.  Skin: Negative for itching and rash.  Neurological: Positive for speech change. Negative for dizziness, tingling, tremors, sensory change, focal weakness, seizures, loss of consciousness, weakness and headaches.    MEDICATIONS AT HOME:   Prior to Admission medications   Medication Sig Start Date End Date Taking? Authorizing Provider  aspirin EC 81 MG EC tablet Take 1 tablet (81 mg total) by mouth daily. 07/26/15  Yes Auburn Bilberry, MD  atorvastatin (LIPITOR) 40 MG tablet Take 1 tablet (40 mg total) by mouth daily at 6 PM. 07/26/15  Yes Auburn Bilberry, MD  cetirizine (ZYRTEC) 10 MG tablet Take 10 mg by mouth daily.   Yes [provider]  cholecalciferol (VITAMIN D) 1000 units tablet Take 2,000 Units by mouth daily.   Yes [provider]  clopidogrel (PLAVIX) 75 MG tablet Take 1 tablet (75 mg total) by mouth daily. 07/26/15  Yes Auburn Bilberry, MD  clorazepate (TRANXENE) 7.5 MG tablet Take 1 tablet by mouth daily as needed for anxiety.    Yes [provider]  docusate sodium (COLACE) 100 MG capsule Take 100 mg by mouth 3 (three) times daily.    Yes [provider]    escitalopram (LEXAPRO) 20 MG tablet Take 20 mg by mouth daily.   Yes [provider]  esomeprazole (NEXIUM) 40 MG capsule Take 40 mg by mouth daily at 12 noon.   Yes [provider]  hydrochlorothiazide (HYDRODIURIL) 25 MG tablet Take 25 mg by mouth daily.   Yes [provider]  HYDROcodone-acetaminophen (NORCO/VICODIN) 5-325 MG tablet Take 1-2 tablets by mouth every 4 (four) hours as needed for moderate pain (Max 7 per day). 07/26/15  Yes Auburn Bilberry, MD  mirtazapine (REMERON SOL-TAB) 30 MG disintegrating tablet Take 1 tablet by mouth daily.   Yes [provider]  Omega-3 Fatty Acids (FISH OIL) 1000 MG CAPS Take 1,000 mg by mouth every morning.   Yes [provider]  quinapril (ACCUPRIL) 40 MG tablet Take 40 mg by mouth at bedtime.   Yes [provider]  VESICARE 10 MG tablet Take 10 mg by mouth daily. 10/24/16  Yes [provider]  traMADol (ULTRAM) 50 MG tablet Take 1 tablet (50 mg total) by mouth every 6 (six) hours as needed. Patient not taking: Reported on 12/14/2017 05/23/17 05/23/18  Emily Filbert, MD  VITAL SIGNS:  Blood pressure (!) 122/106, pulse 98, temperature 98.6 F (37 C), resp. rate 18, height 5\' 9"  (1.753 m), weight 104.3 kg (230 lb), SpO2 93 %.  PHYSICAL EXAMINATION:  Physical Exam  Constitutional: He is oriented to person, place, and time. He appears well-developed and well-nourished. He is active.  Non-toxic appearance. He does not have a sickly appearance. He does not appear ill. No distress.  HENT:  Head: Normocephalic and atraumatic.  Mouth/Throat: No oropharyngeal exudate.  Eyes: Pupils are equal, round, and reactive to light. Conjunctivae, EOM and lids are normal. No scleral icterus.  Neck: Neck supple. No JVD present. No thyromegaly present.  Cardiovascular: Normal rate, regular rhythm, S1 normal, S2 normal and normal heart sounds.  No extrasystoles are present. Exam reveals no gallop, no S3,  no S4, no distant heart sounds and no friction rub.  No murmur heard. Pulmonary/Chest: Effort normal and breath sounds normal. No accessory muscle usage or stridor. No respiratory distress. He has no wheezes. He has no rhonchi. He has no rales.  (+) implantable cardiac loop recorder.  Abdominal: Soft. Bowel sounds are normal. He exhibits no distension. There is no tenderness. There is no rebound and no guarding.  Musculoskeletal: He exhibits no edema.  Lymphadenopathy:    He has no cervical adenopathy.  Neurological: He is alert and oriented to person, place, and time. He is not disoriented. He displays normal reflexes. No cranial nerve deficit or sensory deficit. He exhibits normal muscle tone.  Skin: Skin is warm and dry. No rash noted. He is not diaphoretic. No erythema.  Psychiatric: He has a normal mood and affect. His behavior is normal. Judgment and thought content normal. His mood appears not anxious. His affect is not angry, not blunt, not labile and not inappropriate. His speech is not rapid and/or pressured, not delayed, not tangential and not slurred. He is not agitated, not aggressive, not hyperactive, not slowed, not withdrawn, not actively hallucinating and not combative. Thought content is not paranoid and not delusional. Cognition and memory are impaired. He does not express impulsivity or inappropriate judgment. He does not exhibit a depressed mood. He is communicative.  (+) mild impairment in memory/cognition per pt's wife. Pt oriented to month (April), city/town [facility]), current POTUS (Trump), but cannot recall year (2019) or name of hospital. Pt otherwise neurologically non-focal. He is attentive.   LABORATORY PANEL:   CBC Recent Labs  Lab 12/14/17 2310  WBC 8.3  HGB 15.3  HCT 43.9  PLT 275   ------------------------------------------------------------------------------------------------------------------  Chemistries  Recent Labs  Lab 12/14/17 2310  NA 133*    K 3.3*  CL 94*  CO2 30  GLUCOSE 113*  BUN 14  CREATININE 1.22  CALCIUM 9.8  AST 26  ALT 27  ALKPHOS 74  BILITOT 0.6   ------------------------------------------------------------------------------------------------------------------  Cardiac Enzymes Recent Labs  Lab 12/14/17 2310  TROPONINI <0.03   ------------------------------------------------------------------------------------------------------------------  RADIOLOGY:  Ct Angio Head W Or Wo Contrast  Result Date: 12/15/2017 CLINICAL DATA:  Initial evaluation for acute stroke, expressive aphasia. EXAM: CT ANGIOGRAPHY HEAD AND NECK CT PERFUSION BRAIN TECHNIQUE: Multidetector CT imaging of the head and neck was performed using the standard protocol during bolus administration of intravenous contrast. Multiplanar CT image reconstructions and MIPs were obtained to evaluate the vascular anatomy. Carotid stenosis measurements (when applicable) are obtained utilizing NASCET criteria, using the distal internal carotid diameter as the denominator. Multiphase CT imaging of the brain was performed following IV bolus contrast injection. Subsequent parametric perfusion  maps were calculated using RAPID software. CONTRAST:  ISOVUE-370 IOPAMIDOL (ISOVUE-370) INJECTION 76% COMPARISON:  Prior CT from earlier the same day. FINDINGS: CTA NECK FINDINGS Aortic arch: Visualized aortic arch of normal caliber with normal 3 vessel morphology. No flow-limiting stenosis about the origin of the great vessels. Visualized subclavian arteries widely patent. Right carotid system: Right common carotid artery patent from its origin to the bifurcation without stenosis. Minimal atheromatous plaque about the right bifurcation without hemodynamically significant stenosis. Right ICA widely patent distally to the skull base without stenosis, dissection, or occlusion. Left carotid system: Left common carotid artery patent from its origin to the bifurcation without  stenosis. Calcified atheromatous plaque about the left bifurcation with associated short-segment stenosis of up to approximately 50% by NASCET criteria. Left ICA patent distally to the skull base without stenosis, dissection, or occlusion. Vertebral arteries: Both of the vertebral arteries arise from the subclavian arteries. Focal plaque at the origin of the vertebral arteries bilaterally with secondary mild to moderate stenosis, slightly worse on the right. Vertebral arteries otherwise widely patent within the neck without stenosis, dissection, or occlusion. Left vertebral artery slightly dominant. Skeleton: No acute osseus abnormality. No worrisome lytic or blastic osseous lesions. Moderate degenerative spondylolysis present at C5-6 and C6-7. Other neck: No acute soft tissue abnormality within the neck. Salivary glands within normal limits. No adenopathy. Thyroid normal. Upper chest: Visualized upper chest within normal limits. Visualized lungs are clear. Review of the MIP images confirms the above findings CTA HEAD FINDINGS Anterior circulation: Petrous segments widely patent bilaterally. Atheromatous plaque within the cavernous/supraclinoid ICAs with resultant moderate to severe stenosis on the right (series 7, image 118), with relatively mild stenosis on the left. Origin of the ophthalmic arteries patent. ICA termini widely patent. A1 segments, anterior communicating artery common anterior cerebral arteries widely patent and within normal limits. No M1 segment occlusion or stenosis. No proximal M2 occlusion. Distal MCA branches well perfused and symmetric. Posterior circulation: Vertebral arteries widely patent to the vertebrobasilar junction. Posterior inferior cerebral arteries patent bilaterally. Basilar artery widely patent to its distal aspect. Superior cerebral arteries patent bilaterally. Both of the posterior cerebral arteries primarily supplied via the basilar and are well perfused to their distal  aspects. Moderate to severe tandem proximal and mid left P2 stenoses noted. No significant stenosis on the right. Small right posterior communicating artery noted. Venous sinuses: Patent. Anatomic variants: No significant anatomic variant. No aneurysm or vascular abnormality. Delayed phase: Not performed. Review of the MIP images confirms the above findings CT Brain Perfusion Findings: CBF (<30%) Volume: 0mL Perfusion (Tmax>6.0s) volume: 5mL Mismatch Volume: 5mL Infarction Location:No acute infarct by CT perfusion. Mildly delayed perfusion within the left temporal region suspected to be related to left ICA stenosis. IMPRESSION: 1. Negative CTA for emergent large vessel occlusion. 2. No acute infarct by CT perfusion. 3. Atheromatous plaque about the left carotid bifurcation with associated stenosis of up to 50% by NASCET criteria. 4. 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 5. Moderate to severe tandem proximal-mid left P2 stenoses. Electronically Signed   By: Rise Mu M.D.   On: 12/15/2017 00:46   Ct Angio Neck W And/or Wo Contrast  Result Date: 12/15/2017 CLINICAL DATA:  Initial evaluation for acute stroke, expressive aphasia. EXAM: CT ANGIOGRAPHY HEAD AND NECK CT PERFUSION BRAIN TECHNIQUE: Multidetector CT imaging of the head and neck was performed using the standard protocol during bolus administration of intravenous contrast. Multiplanar CT image reconstructions and MIPs  were obtained to evaluate the vascular anatomy. Carotid stenosis measurements (when applicable) are obtained utilizing NASCET criteria, using the distal internal carotid diameter as the denominator. Multiphase CT imaging of the brain was performed following IV bolus contrast injection. Subsequent parametric perfusion maps were calculated using RAPID software. CONTRAST:  ISOVUE-370 IOPAMIDOL (ISOVUE-370) INJECTION 76% COMPARISON:  Prior CT from earlier the same day. FINDINGS: CTA  NECK FINDINGS Aortic arch: Visualized aortic arch of normal caliber with normal 3 vessel morphology. No flow-limiting stenosis about the origin of the great vessels. Visualized subclavian arteries widely patent. Right carotid system: Right common carotid artery patent from its origin to the bifurcation without stenosis. Minimal atheromatous plaque about the right bifurcation without hemodynamically significant stenosis. Right ICA widely patent distally to the skull base without stenosis, dissection, or occlusion. Left carotid system: Left common carotid artery patent from its origin to the bifurcation without stenosis. Calcified atheromatous plaque about the left bifurcation with associated short-segment stenosis of up to approximately 50% by NASCET criteria. Left ICA patent distally to the skull base without stenosis, dissection, or occlusion. Vertebral arteries: Both of the vertebral arteries arise from the subclavian arteries. Focal plaque at the origin of the vertebral arteries bilaterally with secondary mild to moderate stenosis, slightly worse on the right. Vertebral arteries otherwise widely patent within the neck without stenosis, dissection, or occlusion. Left vertebral artery slightly dominant. Skeleton: No acute osseus abnormality. No worrisome lytic or blastic osseous lesions. Moderate degenerative spondylolysis present at C5-6 and C6-7. Other neck: No acute soft tissue abnormality within the neck. Salivary glands within normal limits. No adenopathy. Thyroid normal. Upper chest: Visualized upper chest within normal limits. Visualized lungs are clear. Review of the MIP images confirms the above findings CTA HEAD FINDINGS Anterior circulation: Petrous segments widely patent bilaterally. Atheromatous plaque within the cavernous/supraclinoid ICAs with resultant moderate to severe stenosis on the right (series 7, image 118), with relatively mild stenosis on the left. Origin of the ophthalmic arteries patent.  ICA termini widely patent. A1 segments, anterior communicating artery common anterior cerebral arteries widely patent and within normal limits. No M1 segment occlusion or stenosis. No proximal M2 occlusion. Distal MCA branches well perfused and symmetric. Posterior circulation: Vertebral arteries widely patent to the vertebrobasilar junction. Posterior inferior cerebral arteries patent bilaterally. Basilar artery widely patent to its distal aspect. Superior cerebral arteries patent bilaterally. Both of the posterior cerebral arteries primarily supplied via the basilar and are well perfused to their distal aspects. Moderate to severe tandem proximal and mid left P2 stenoses noted. No significant stenosis on the right. Small right posterior communicating artery noted. Venous sinuses: Patent. Anatomic variants: No significant anatomic variant. No aneurysm or vascular abnormality. Delayed phase: Not performed. Review of the MIP images confirms the above findings CT Brain Perfusion Findings: CBF (<30%) Volume: 0mL Perfusion (Tmax>6.0s) volume: 5mL Mismatch Volume: 5mL Infarction Location:No acute infarct by CT perfusion. Mildly delayed perfusion within the left temporal region suspected to be related to left ICA stenosis. IMPRESSION: 1. Negative CTA for emergent large vessel occlusion. 2. No acute infarct by CT perfusion. 3. Atheromatous plaque about the left carotid bifurcation with associated stenosis of up to 50% by NASCET criteria. 4. 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 5. Moderate to severe tandem proximal-mid left P2 stenoses. Electronically Signed   By: Rise Mu M.D.   On: 12/15/2017 00:46   Ct Cerebral Perfusion W Contrast  Result Date: 12/15/2017 CLINICAL DATA:  Initial evaluation for  acute stroke, expressive aphasia. EXAM: CT ANGIOGRAPHY HEAD AND NECK CT PERFUSION BRAIN TECHNIQUE: Multidetector CT imaging of the head and neck was performed using  the standard protocol during bolus administration of intravenous contrast. Multiplanar CT image reconstructions and MIPs were obtained to evaluate the vascular anatomy. Carotid stenosis measurements (when applicable) are obtained utilizing NASCET criteria, using the distal internal carotid diameter as the denominator. Multiphase CT imaging of the brain was performed following IV bolus contrast injection. Subsequent parametric perfusion maps were calculated using RAPID software. CONTRAST:  ISOVUE-370 IOPAMIDOL (ISOVUE-370) INJECTION 76% COMPARISON:  Prior CT from earlier the same day. FINDINGS: CTA NECK FINDINGS Aortic arch: Visualized aortic arch of normal caliber with normal 3 vessel morphology. No flow-limiting stenosis about the origin of the great vessels. Visualized subclavian arteries widely patent. Right carotid system: Right common carotid artery patent from its origin to the bifurcation without stenosis. Minimal atheromatous plaque about the right bifurcation without hemodynamically significant stenosis. Right ICA widely patent distally to the skull base without stenosis, dissection, or occlusion. Left carotid system: Left common carotid artery patent from its origin to the bifurcation without stenosis. Calcified atheromatous plaque about the left bifurcation with associated short-segment stenosis of up to approximately 50% by NASCET criteria. Left ICA patent distally to the skull base without stenosis, dissection, or occlusion. Vertebral arteries: Both of the vertebral arteries arise from the subclavian arteries. Focal plaque at the origin of the vertebral arteries bilaterally with secondary mild to moderate stenosis, slightly worse on the right. Vertebral arteries otherwise widely patent within the neck without stenosis, dissection, or occlusion. Left vertebral artery slightly dominant. Skeleton: No acute osseus abnormality. No worrisome lytic or blastic osseous lesions. Moderate degenerative  spondylolysis present at C5-6 and C6-7. Other neck: No acute soft tissue abnormality within the neck. Salivary glands within normal limits. No adenopathy. Thyroid normal. Upper chest: Visualized upper chest within normal limits. Visualized lungs are clear. Review of the MIP images confirms the above findings CTA HEAD FINDINGS Anterior circulation: Petrous segments widely patent bilaterally. Atheromatous plaque within the cavernous/supraclinoid ICAs with resultant moderate to severe stenosis on the right (series 7, image 118), with relatively mild stenosis on the left. Origin of the ophthalmic arteries patent. ICA termini widely patent. A1 segments, anterior communicating artery common anterior cerebral arteries widely patent and within normal limits. No M1 segment occlusion or stenosis. No proximal M2 occlusion. Distal MCA branches well perfused and symmetric. Posterior circulation: Vertebral arteries widely patent to the vertebrobasilar junction. Posterior inferior cerebral arteries patent bilaterally. Basilar artery widely patent to its distal aspect. Superior cerebral arteries patent bilaterally. Both of the posterior cerebral arteries primarily supplied via the basilar and are well perfused to their distal aspects. Moderate to severe tandem proximal and mid left P2 stenoses noted. No significant stenosis on the right. Small right posterior communicating artery noted. Venous sinuses: Patent. Anatomic variants: No significant anatomic variant. No aneurysm or vascular abnormality. Delayed phase: Not performed. Review of the MIP images confirms the above findings CT Brain Perfusion Findings: CBF (<30%) Volume: 0mL Perfusion (Tmax>6.0s) volume: 5mL Mismatch Volume: 5mL Infarction Location:No acute infarct by CT perfusion. Mildly delayed perfusion within the left temporal region suspected to be related to left ICA stenosis. IMPRESSION: 1. Negative CTA for emergent large vessel occlusion. 2. No acute infarct by CT  perfusion. 3. Atheromatous plaque about the left carotid bifurcation with associated stenosis of up to 50% by NASCET criteria. 4. Atheromatous stenosis at the supraclinoid right ICA with moderate to severe  stenosis. 5. Moderate to severe tandem proximal-mid left P2 stenoses. Electronically Signed   By: Rise Mu M.D.   On: 12/15/2017 00:46   Ct Head Code Stroke Wo Contrast  Result Date: 12/14/2017 CLINICAL DATA:  Code stroke. Initial evaluation for acute expressive aphasia. EXAM: CT HEAD WITHOUT CONTRAST TECHNIQUE: Contiguous axial images were obtained from the base of the skull through the vertex without intravenous contrast. COMPARISON:  Prior CT from 05/23/2017. FINDINGS: Brain: Atrophy with chronic small vessel ischemic disease. Scatter remote bilateral cerebellar infarcts, left greater than right. No acute intracranial hemorrhage. No acute large vessel territory infarct. No mass lesion, midline shift or mass effect. Diffuse ventricular prominence related global parenchymal volume loss of hydrocephalus. No extra-axial fluid collection. Vascular: No asymmetric hyperdense vessel. Scattered vascular calcifications noted within the carotid siphons. Skull: Scalp soft tissues and calvarium within normal limits. Sinuses/Orbits: Globes and orbital soft tissues within normal limits. Mild scattered mucosal thickening within the ethmoidal air cells. Paranasal sinuses are otherwise clear. Small right mastoid effusion noted, of doubtful significance. Other: None. ASPECTS [facility] Stroke Program Early CT Score) - Ganglionic level infarction (caudate, lentiform nuclei, internal capsule, insula, M1-M3 cortex): 7 - Supraganglionic infarction (M4-M6 cortex): 3 Total score (0-10 with 10 being normal): 10 IMPRESSION: 1. No acute intracranial infarct or other process identified. 2. ASPECTS is 10. 3. 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 Critical Value/emergent  results were called by telephone at the time of interpretation on 12/14/2017 at 11:33 pm to Dr. Lesly Rubenstein SUNG , who verbally acknowledged these results. Electronically Signed   By: Rise Mu M.D.   On: 12/14/2017 23:34   IMPRESSION AND PLAN:   A/P: 21M transient aphasia, LGIB.  1.) Transient aphasia: Pt p/w 1d Hx transient aphasia, narrative as per HPI. Has Hx cerebellar CVA, and is on dual antiplatelet therapy (ASA + Plavix). He endorses adherence to his daily medication regimen. Apart from some mild memory/cognitive impairment, he is largely non-focal on examination. He answers questions, responds appropriately and follows commands w/o difficulty. ED Neuroimaging (-) acute intracranial process. Cerebral perfusion imaging noted the following: "-Negative CTA for emergent large vessel occlusion. -No acute infarct by CT perfusion. 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 Sheria Lang, who recommended brain MRI (w/o Gadolinium), hold Plavix, GI evaluation, Neurology evaluation. Pt is not ordered for MRI at the moment, as he has an implantable cardiac loop recorder (Reveal ICM, Model # [id number], Serial # [id number]). Pt's wife thinks pt may have had an MRI after device was implanted, but she is not sure. Device website states as follows: "Reveal LINQ Insertable Cardiac Monitor MRI Conditions for Use: A patient with a Reveal LINQ device can be safely scanned in an MR system that meets the following conditions with no post-insertion waiting required. Failure to follow these conditions for use may result in a hazard to the patient during an MRI scan: -Horizontal cylindrical bore magnet, clinical MRI systems with a static magnetic field of 1.5 Tesla (T) or  3.0 T must be used. -Hydrogen proton MRI equipment must be used. -Maximum spatial gradient of the static magnetic field specification must be ?25 T/m (2500 gauss/ cm). -Whole body gradient systems with gradient slew rate specification must be ?200 T/m/s per axis. -The Whole Body Specific  Absorption Rate (WB-SAR) as reported by the MRI equipment must be ?4.0 W/kg; the head SAR as reported by the MRI equipment must be ?3.2 W/kg. -Do not use local transmit coils on the chest, trunk, or shoulder region. -There are no restrictions on the placement of receive-only coils, and there are no restrictions on the use of local transmit or receive coils for imaging of the head or extremities." I do not know if our MRI equipment meets these criteria. This will obviously need to be determined before the study is ordered. As recommended, Plavix held (ASA cont'd), and GI and Neurology consulted. Echocardiogram pending. Neuro checks, tele, cardiac monitoring. C/w Statin.  2.) LGIB/hemorrhoids: Pt w/ reported BRBPR/LGIB, as per HPI. Described as pink/light red water in toilet bowl, w/ small clots. Hgb 15.3. Were the pt to have a frank GI bleed on dual antiplatelet therapy, I would expect a more impressive presentation, i.e. large quantity frank bright red blood per rectum paired with drop in Hgb. Based on the narrative surrounding this admission, I am more inclined to suspect that the pt's bleeding was hemorrhoidal in etiology. Last Colonoscopy 08/23/2012, (+) hemorrhoids. Pt was noted to have a large non-thrombosed external hemorrhoid (w/ blood streaks) on ED examination. Nonetheless, despite my low suspicion for hemodynamically-significant lower GI blood loss, pt's Plavix held and GI consulted per tele-Neurologist consultant recommendations.  3.) Hyponatremia: Na+ 133, mild. Likely 2/2 HCTZ, dehydration. IVF LR x1L, monitor BMP.  4.) Hypokalemia: K+ 3.3, mild. Likely 2/2 HCTZ. Replete K+, monitor BMP.  5.)  Hyperglycemia: Glucose 113. HbA1c pending.  6.) HTN: c/w HCTZ, Quinapril.  7.) HLD/CVA: Hold Plavix (as above). C/w ASA, Statin.  8.) GERD: Protonix (FS Nexium).  9.) Anx/dep/insom: c/w Clorazepate, Lexapro, Remeron.  10.) FEN/GI: Cardiac diet, IVF, Protonix.  11.) DVT PPx: Lovenox 40mg  SQ qD.  12.) Code status: Full code.  13.) Disposition: Observation, pt expected to stay < 2 midnights.   All the records are reviewed and case discussed with ED provider. Management plans discussed with the patient, family and they are in agreement.  CODE STATUS: Full code.  TOTAL TIME TAKING CARE OF THIS PATIENT: 90 minutes.    Barbaraann Rondo M.D on 12/15/2017 at 2:10 AM  Between 7am to 6pm - Pager - [phone]  After 6pm go to [email or web address] - Social research officer, government  [facility]  Office  [phone]  CC: Primary care physician; Linfors, Leane Call, MD   Note: This dictation was prepared with Dragon dictation along with smaller phrase technology. Any transcriptional errors that result from this process are unintentional.

## 2017-12-15 NOTE — Consult Note (Signed)
   [facility] Tele[facility]urology Consult Services  Impression: Patient with resolved episode of aphasia concerning for LMCA territory TIA vs.stroke with rapidly resolving deficits.  Also note the patient was having GI bleeding tonight, which needs evaluated.   Not a tpa candidate due to: resolved, GI bleeding today Not an NIR candidate due to: no LVO on CTA   Differential Diagnosis:   1. Cardioembolic stroke  2. Small vessel disease/lacune  3. Thromboembolic, artery-to-artery mechanism  4. Hypercoagulable state-related infarct  5. Transient ischemic attack  6. Thrombotic mechanism, large artery disease   Comments:   Door time: 2254 [facility] contacted: [id number] [facility] at bedside: 2335 NIHSS assessment time: 0001 (pt in CT on log in)  Recommendations:  MRI brain wo Hold antiplatelet until GI bleed evaluated inpatient neurology consultation Inpatient stroke evaluation as per Neurology/ Internal Medicine Discussed with ED MD  -----------------------------------------------------------------------------------------  CC: stroke alert  History of Present Illness  Patient is a 72 year old man with a history of HTN, prior cerebellar strokes with residual gait instability, prior trauamtic SDH (asymptomatic) who presented with transient aphasia.  He was last normal at 2130.  He went to the bathroom after that and emerged speaking nonsensically.  Wife recognized this as a possible stroke so called 911.  He was transported here and symptoms resolved after arrival.  No weakness, sensory loss, vision changes, HA, CP.  Wife notes tonight the patient was passing blood/blood clots with bowel movements.  Diagnostic: CT head wo - nothing acute CTA head/neck - no LVO CTP brain - 5mL left temporal oligemia  Exam: NIHSS score: 1a LOC: 0  1b Questions: 0  1c Commands: 0 2 Gaze: 0  3 VF: 0  4 Face: 0 5a Motor arm left: 0  5b Motor arm right: 0 6a Motor leg left: 0 6b Motor leg  right: 0  7 Ataxia: 0  8 Sensory: 0  9: Language: 0  10: Speech: 0  11: Extinction: 0       Medical Decision Making:  - Extensive number of diagnosis or management options are considered above.   - Extensive amount of complex data reviewed.   - High risk of complication and/or morbidity or mortality are associated with differential diagnostic considerations above.  - There may be Uncertain outcome and increased probability of prolonged functional impairment or high probability of severe prolonged functional impairment associated with some of these differential diagnosis.   Medical Data Reviewed:  1.Data reviewed include clinical labs, radiology,  Medical Tests;   2.Tests results discussed w/performing or interpreting physician;   3.Obtaining/reviewing old medical records;  4.Obtaining case history from another source;  5.Independent review of image, tracing or specimen.    Patient was informed the Neurology Consult would happen via telehealth (remote video) and consented to receiving care in this manner.

## 2017-12-15 NOTE — Discharge Summary (Signed)
[facility] - [place] at [facility]   PATIENT NAME: Manville Rico    MR#:  [id number]  DATE OF BIRTH:  02-Oct-1945  DATE OF ADMISSION:  12/14/2017 ADMITTING PHYSICIAN: Cammy Copa, MD  DATE OF DISCHARGE: 12/15/2017 12:03 PM  PRIMARY CARE PHYSICIAN: Linfors, Leane Call, MD    ADMISSION DIAGNOSIS:  TIA (transient ischemic attack) [G45.9] Gastrointestinal hemorrhage, unspecified gastrointestinal hemorrhage type [K92.2]  DISCHARGE DIAGNOSIS:  Active Problems:   TIA (transient ischemic attack)   SECONDARY DIAGNOSIS:   Past Medical History:  Diagnosis Date  . Cerebellar stroke (HCC)   . Depression   . Hypertension   . IBS (irritable bowel syndrome)   . Stroke (cerebrum) (HCC)   . Stroke (HCC)   . Subdural hemorrhage (HCC)     HOSPITAL COURSE:   1.  Transient ischemic attack with transient aphasia.  History of stroke.  Continue aspirin, Plavix and statin.  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  Case discussed with neurology, MRI of the brain would not change management.  Patient has a loop recorder and that is being monitored.  He has had previous echocardiogram.  CT angios shows left carotid stenosis little less than 50%.  Patient refused MRI of the brain.  Patient refused physical therapy and Occupational Therapy evaluations.   Patient states that his symptoms has resolved and he wants to go home.  Patient was discharged home. 2.  Rectal bleeding with hemorrhoids.  Colonoscopy in 2014 showing hemorrhoids. Patient's hemoglobin stable from last. 3.  Hyponatremia and hypokalemia.  Potassium was replaced and IV fluids.  I offered to stop the hydrochlorothiazide and they refused.  I offered to give potassium replacement upon discharge and they refused. 4.  Hyperlipidemia unspecified.  I added on a lipid profile and LDL was 54 which is at goal.  Continue Lipitor. 5.  Obesity.  Weight loss needed 6.  Essential hypertension continue usual  medications because family refused to come off the hydrochlorothiazide 7.  Anxiety depression continue psychiatric medication  DISCHARGE CONDITIONS:   Satisfactory  CONSULTS OBTAINED:  Treatment Team:  Pauletta Browns, MD  DRUG ALLERGIES:   Allergies  Allergen Reactions  . Doxycycline Nausea And Vomiting  . Morphine And Related Other (See Comments)    Agitation - wife does not want him to have it    DISCHARGE MEDICATIONS:   Allergies as of 12/15/2017      Reactions   Doxycycline Nausea And Vomiting   Morphine And Related Other (See Comments)   Agitation - wife does not want him to have it      Medication List    STOP taking these medications   traMADol 50 MG tablet Commonly known as:  ULTRAM   VESICARE 10 MG tablet Generic drug:  solifenacin     TAKE these medications   aspirin 81 MG EC tablet Take 1 tablet (81 mg total) by mouth daily.   atorvastatin 40 MG tablet Commonly known as:  LIPITOR Take 1 tablet (40 mg total) by mouth daily at 6 PM.   cetirizine 10 MG tablet Commonly known as:  ZYRTEC Take 10 mg by mouth daily.   cholecalciferol 1000 units tablet Commonly known as:  VITAMIN D Take 2,000 Units by mouth daily.   clopidogrel 75 MG tablet Commonly known as:  PLAVIX Take 1 tablet (75 mg total) by mouth daily.   clorazepate 7.5 MG tablet Commonly known as:  TRANXENE Take 1 tablet by mouth daily as  needed for anxiety.   docusate sodium 100 MG capsule Commonly known as:  COLACE Take 100 mg by mouth 3 (three) times daily.   escitalopram 20 MG tablet Commonly known as:  LEXAPRO Take 20 mg by mouth daily.   esomeprazole 40 MG capsule Commonly known as:  NEXIUM Take 40 mg by mouth daily at 12 noon.   Fish Oil 1000 MG Caps Take 1,000 mg by mouth every morning.   hydrochlorothiazide 25 MG tablet Commonly known as:  HYDRODIURIL Take 25 mg by mouth daily.   HYDROcodone-acetaminophen 5-325 MG tablet Commonly known as:  NORCO/VICODIN Take  1-2 tablets by mouth every 4 (four) hours as needed for moderate pain (Max 7 per day).   mirtazapine 30 MG disintegrating tablet Commonly known as:  REMERON SOL-TAB Take 1 tablet by mouth daily.   quinapril 40 MG tablet Commonly known as:  ACCUPRIL Take 40 mg by mouth at bedtime.        DISCHARGE INSTRUCTIONS:   Follow-up PMD 1 week  If you experience worsening of your admission symptoms, develop shortness of breath, life threatening emergency, suicidal or homicidal thoughts you must seek medical attention immediately by calling 911 or calling your MD immediately  if symptoms less severe.  You Must read complete instructions/literature along with all the possible adverse reactions/side effects for all the Medicines you take and that have been prescribed to you. Take any new Medicines after you have completely understood and accept all the possible adverse reactions/side effects.   Please note  You were cared for by a hospitalist during your hospital stay. If you have any questions about your discharge medications or the care you received while you were in the hospital after you are discharged, you can call the unit and asked to speak with the hospitalist on call if the hospitalist that took care of you is not available. Once you are discharged, your primary care physician will handle any further medical issues. Please note that NO REFILLS for any discharge medications will be authorized once you are discharged, as it is imperative that you return to your primary care physician (or establish a relationship with a primary care physician if you do not have one) for your aftercare needs so that they can reassess your need for medications and monitor your lab values.    Today   CHIEF COMPLAINT:   Chief Complaint  Patient presents with  . Aphasia    HISTORY OF PRESENT ILLNESS:  Trayson Stitely  is a 72 y.o. male with a known history of stroke presented with aphasia which was  transient   VITAL SIGNS:  Blood pressure 124/70, pulse (!) 57, temperature 98.7 F (37.1 C), temperature source Oral, resp. rate 16, height 5\' 9"  (1.753 m), weight 106.5 kg (234 lb 12.8 oz), SpO2 99 %.    PHYSICAL EXAMINATION:  GENERAL:  72 y.o.-year-old patient lying in the bed with no acute distress.  EYES: Pupils equal, round, reactive to light and accommodation. No scleral icterus. Extraocular muscles intact.  HEENT: Head atraumatic, normocephalic. Oropharynx and nasopharynx clear.  NECK:  Supple, no jugular venous distention. No thyroid enlargement, no tenderness.  LUNGS: Normal breath sounds bilaterally, no wheezing, rales,rhonchi or crepitation. No use of accessory muscles of respiration.  CARDIOVASCULAR: S1, S2 normal. No murmurs, rubs, or gallops.  ABDOMEN: Soft, non-tender, non-distended. Bowel sounds present. No organomegaly or mass.  EXTREMITIES: 2+  pedal edema, no cyanosis, or clubbing.  NEUROLOGIC: Cranial nerves II through XII are intact. Muscle strength  5/5 in all extremities. Sensation intact. Gait not checked.  PSYCHIATRIC: The patient is alert and oriented x 3.  SKIN: No obvious rash, lesion, or ulcer.   DATA REVIEW:   CBC Recent Labs  Lab 12/14/17 2310  WBC 8.3  HGB 15.3  HCT 43.9  PLT 275    Chemistries  Recent Labs  Lab 12/14/17 2310  NA 133*  K 3.3*  CL 94*  CO2 30  GLUCOSE 113*  BUN 14  CREATININE 1.22  CALCIUM 9.8  AST 26  ALT 27  ALKPHOS 74  BILITOT 0.6    Cardiac Enzymes Recent Labs  Lab 12/14/17 2310  TROPONINI <0.03     RADIOLOGY:  Ct Angio Head W Or Wo Contrast  Result Date: 12/15/2017 CLINICAL DATA:  Initial evaluation for acute stroke, expressive aphasia. EXAM: CT ANGIOGRAPHY HEAD AND NECK CT PERFUSION BRAIN TECHNIQUE: Multidetector CT imaging of the head and neck was performed using the standard protocol during bolus administration of intravenous contrast. Multiplanar CT image reconstructions and MIPs were obtained to  evaluate the vascular anatomy. Carotid stenosis measurements (when applicable) are obtained utilizing NASCET criteria, using the distal internal carotid diameter as the denominator. Multiphase CT imaging of the brain was performed following IV bolus contrast injection. Subsequent parametric perfusion maps were calculated using RAPID software. CONTRAST:  ISOVUE-370 IOPAMIDOL (ISOVUE-370) INJECTION 76% COMPARISON:  Prior CT from earlier the same day. FINDINGS: CTA NECK FINDINGS Aortic arch: Visualized aortic arch of normal caliber with normal 3 vessel morphology. No flow-limiting stenosis about the origin of the great vessels. Visualized subclavian arteries widely patent. Right carotid system: Right common carotid artery patent from its origin to the bifurcation without stenosis. Minimal atheromatous plaque about the right bifurcation without hemodynamically significant stenosis. Right ICA widely patent distally to the skull base without stenosis, dissection, or occlusion. Left carotid system: Left common carotid artery patent from its origin to the bifurcation without stenosis. Calcified atheromatous plaque about the left bifurcation with associated short-segment stenosis of up to approximately 50% by NASCET criteria. Left ICA patent distally to the skull base without stenosis, dissection, or occlusion. Vertebral arteries: Both of the vertebral arteries arise from the subclavian arteries. Focal plaque at the origin of the vertebral arteries bilaterally with secondary mild to moderate stenosis, slightly worse on the right. Vertebral arteries otherwise widely patent within the neck without stenosis, dissection, or occlusion. Left vertebral artery slightly dominant. Skeleton: No acute osseus abnormality. No worrisome lytic or blastic osseous lesions. Moderate degenerative spondylolysis present at C5-6 and C6-7. Other neck: No acute soft tissue abnormality within the neck. Salivary glands within normal limits. No  adenopathy. Thyroid normal. Upper chest: Visualized upper chest within normal limits. Visualized lungs are clear. Review of the MIP images confirms the above findings CTA HEAD FINDINGS Anterior circulation: Petrous segments widely patent bilaterally. Atheromatous plaque within the cavernous/supraclinoid ICAs with resultant moderate to severe stenosis on the right (series 7, image 118), with relatively mild stenosis on the left. Origin of the ophthalmic arteries patent. ICA termini widely patent. A1 segments, anterior communicating artery common anterior cerebral arteries widely patent and within normal limits. No M1 segment occlusion or stenosis. No proximal M2 occlusion. Distal MCA branches well perfused and symmetric. Posterior circulation: Vertebral arteries widely patent to the vertebrobasilar junction. Posterior inferior cerebral arteries patent bilaterally. Basilar artery widely patent to its distal aspect. Superior cerebral arteries patent bilaterally. Both of the posterior cerebral arteries primarily supplied via the basilar and are well perfused to their distal  aspects. Moderate to severe tandem proximal and mid left P2 stenoses noted. No significant stenosis on the right. Small right posterior communicating artery noted. Venous sinuses: Patent. Anatomic variants: No significant anatomic variant. No aneurysm or vascular abnormality. Delayed phase: Not performed. Review of the MIP images confirms the above findings CT Brain Perfusion Findings: CBF (<30%) Volume: 0mL Perfusion (Tmax>6.0s) volume: 5mL Mismatch Volume: 5mL Infarction Location:No acute infarct by CT perfusion. Mildly delayed perfusion within the left temporal region suspected to be related to left ICA stenosis. IMPRESSION: 1. Negative CTA for emergent large vessel occlusion. 2. No acute infarct by CT perfusion. 3. Atheromatous plaque about the left carotid bifurcation with associated stenosis of up to 50% by NASCET criteria. 4. Atheromatous  stenosis at the supraclinoid right ICA with moderate to severe stenosis. 5. Moderate to severe tandem proximal-mid left P2 stenoses. Electronically Signed   By: Rise Mu M.D.   On: 12/15/2017 00:46   Ct Angio Neck W And/or Wo Contrast  Result Date: 12/15/2017 CLINICAL DATA:  Initial evaluation for acute stroke, expressive aphasia. EXAM: CT ANGIOGRAPHY HEAD AND NECK CT PERFUSION BRAIN TECHNIQUE: Multidetector CT imaging of the head and neck was performed using the standard protocol during bolus administration of intravenous contrast. Multiplanar CT image reconstructions and MIPs were obtained to evaluate the vascular anatomy. Carotid stenosis measurements (when applicable) are obtained utilizing NASCET criteria, using the distal internal carotid diameter as the denominator. Multiphase CT imaging of the brain was performed following IV bolus contrast injection. Subsequent parametric perfusion maps were calculated using RAPID software. CONTRAST:  ISOVUE-370 IOPAMIDOL (ISOVUE-370) INJECTION 76% COMPARISON:  Prior CT from earlier the same day. FINDINGS: CTA NECK FINDINGS Aortic arch: Visualized aortic arch of normal caliber with normal 3 vessel morphology. No flow-limiting stenosis about the origin of the great vessels. Visualized subclavian arteries widely patent. Right carotid system: Right common carotid artery patent from its origin to the bifurcation without stenosis. Minimal atheromatous plaque about the right bifurcation without hemodynamically significant stenosis. Right ICA widely patent distally to the skull base without stenosis, dissection, or occlusion. Left carotid system: Left common carotid artery patent from its origin to the bifurcation without stenosis. Calcified atheromatous plaque about the left bifurcation with associated short-segment stenosis of up to approximately 50% by NASCET criteria. Left ICA patent distally to the skull base without stenosis, dissection, or occlusion.  Vertebral arteries: Both of the vertebral arteries arise from the subclavian arteries. Focal plaque at the origin of the vertebral arteries bilaterally with secondary mild to moderate stenosis, slightly worse on the right. Vertebral arteries otherwise widely patent within the neck without stenosis, dissection, or occlusion. Left vertebral artery slightly dominant. Skeleton: No acute osseus abnormality. No worrisome lytic or blastic osseous lesions. Moderate degenerative spondylolysis present at C5-6 and C6-7. Other neck: No acute soft tissue abnormality within the neck. Salivary glands within normal limits. No adenopathy. Thyroid normal. Upper chest: Visualized upper chest within normal limits. Visualized lungs are clear. Review of the MIP images confirms the above findings CTA HEAD FINDINGS Anterior circulation: Petrous segments widely patent bilaterally. Atheromatous plaque within the cavernous/supraclinoid ICAs with resultant moderate to severe stenosis on the right (series 7, image 118), with relatively mild stenosis on the left. Origin of the ophthalmic arteries patent. ICA termini widely patent. A1 segments, anterior communicating artery common anterior cerebral arteries widely patent and within normal limits. No M1 segment occlusion or stenosis. No proximal M2 occlusion. Distal MCA branches well perfused and symmetric. Posterior circulation: Vertebral arteries widely  patent to the vertebrobasilar junction. Posterior inferior cerebral arteries patent bilaterally. Basilar artery widely patent to its distal aspect. Superior cerebral arteries patent bilaterally. Both of the posterior cerebral arteries primarily supplied via the basilar and are well perfused to their distal aspects. Moderate to severe tandem proximal and mid left P2 stenoses noted. No significant stenosis on the right. Small right posterior communicating artery noted. Venous sinuses: Patent. Anatomic variants: No significant anatomic variant. No  aneurysm or vascular abnormality. Delayed phase: Not performed. Review of the MIP images confirms the above findings CT Brain Perfusion Findings: CBF (<30%) Volume: 0mL Perfusion (Tmax>6.0s) volume: 5mL Mismatch Volume: 5mL Infarction Location:No acute infarct by CT perfusion. Mildly delayed perfusion within the left temporal region suspected to be related to left ICA stenosis. IMPRESSION: 1. Negative CTA for emergent large vessel occlusion. 2. No acute infarct by CT perfusion. 3. Atheromatous plaque about the left carotid bifurcation with associated stenosis of up to 50% by NASCET criteria. 4. 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 5. Moderate to severe tandem proximal-mid left P2 stenoses. Electronically Signed   By: Rise Mu M.D.   On: 12/15/2017 00:46   Ct Cerebral Perfusion W Contrast  Result Date: 12/15/2017 CLINICAL DATA:  Initial evaluation for acute stroke, expressive aphasia. EXAM: CT ANGIOGRAPHY HEAD AND NECK CT PERFUSION BRAIN TECHNIQUE: Multidetector CT imaging of the head and neck was performed using the standard protocol during bolus administration of intravenous contrast. Multiplanar CT image reconstructions and MIPs were obtained to evaluate the vascular anatomy. Carotid stenosis measurements (when applicable) are obtained utilizing NASCET criteria, using the distal internal carotid diameter as the denominator. Multiphase CT imaging of the brain was performed following IV bolus contrast injection. Subsequent parametric perfusion maps were calculated using RAPID software. CONTRAST:  ISOVUE-370 IOPAMIDOL (ISOVUE-370) INJECTION 76% COMPARISON:  Prior CT from earlier the same day. FINDINGS: CTA NECK FINDINGS Aortic arch: Visualized aortic arch of normal caliber with normal 3 vessel morphology. No flow-limiting stenosis about the origin of the great vessels. Visualized subclavian arteries widely patent. Right carotid system: Right  common carotid artery patent from its origin to the bifurcation without stenosis. Minimal atheromatous plaque about the right bifurcation without hemodynamically significant stenosis. Right ICA widely patent distally to the skull base without stenosis, dissection, or occlusion. Left carotid system: Left common carotid artery patent from its origin to the bifurcation without stenosis. Calcified atheromatous plaque about the left bifurcation with associated short-segment stenosis of up to approximately 50% by NASCET criteria. Left ICA patent distally to the skull base without stenosis, dissection, or occlusion. Vertebral arteries: Both of the vertebral arteries arise from the subclavian arteries. Focal plaque at the origin of the vertebral arteries bilaterally with secondary mild to moderate stenosis, slightly worse on the right. Vertebral arteries otherwise widely patent within the neck without stenosis, dissection, or occlusion. Left vertebral artery slightly dominant. Skeleton: No acute osseus abnormality. No worrisome lytic or blastic osseous lesions. Moderate degenerative spondylolysis present at C5-6 and C6-7. Other neck: No acute soft tissue abnormality within the neck. Salivary glands within normal limits. No adenopathy. Thyroid normal. Upper chest: Visualized upper chest within normal limits. Visualized lungs are clear. Review of the MIP images confirms the above findings CTA HEAD FINDINGS Anterior circulation: Petrous segments widely patent bilaterally. Atheromatous plaque within the cavernous/supraclinoid ICAs with resultant moderate to severe stenosis on the right (series 7, image 118), with relatively mild stenosis on the left. Origin of the ophthalmic arteries patent. ICA  termini widely patent. A1 segments, anterior communicating artery common anterior cerebral arteries widely patent and within normal limits. No M1 segment occlusion or stenosis. No proximal M2 occlusion. Distal MCA branches well perfused  and symmetric. Posterior circulation: Vertebral arteries widely patent to the vertebrobasilar junction. Posterior inferior cerebral arteries patent bilaterally. Basilar artery widely patent to its distal aspect. Superior cerebral arteries patent bilaterally. Both of the posterior cerebral arteries primarily supplied via the basilar and are well perfused to their distal aspects. Moderate to severe tandem proximal and mid left P2 stenoses noted. No significant stenosis on the right. Small right posterior communicating artery noted. Venous sinuses: Patent. Anatomic variants: No significant anatomic variant. No aneurysm or vascular abnormality. Delayed phase: Not performed. Review of the MIP images confirms the above findings CT Brain Perfusion Findings: CBF (<30%) Volume: 0mL Perfusion (Tmax>6.0s) volume: 5mL Mismatch Volume: 5mL Infarction Location:No acute infarct by CT perfusion. Mildly delayed perfusion within the left temporal region suspected to be related to left ICA stenosis. IMPRESSION: 1. Negative CTA for emergent large vessel occlusion. 2. No acute infarct by CT perfusion. 3. Atheromatous plaque about the left carotid bifurcation with associated stenosis of up to 50% by NASCET criteria. 4. 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 5. Moderate to severe tandem proximal-mid left P2 stenoses. Electronically Signed   By: Rise Mu M.D.   On: 12/15/2017 00:46   Ct Head Code Stroke Wo Contrast  Result Date: 12/14/2017 CLINICAL DATA:  Code stroke. Initial evaluation for acute expressive aphasia. EXAM: CT HEAD WITHOUT CONTRAST TECHNIQUE: Contiguous axial images were obtained from the base of the skull through the vertex without intravenous contrast. COMPARISON:  Prior CT from 05/23/2017. FINDINGS: Brain: Atrophy with chronic small vessel ischemic disease. Scatter remote bilateral cerebellar infarcts, left greater than right. No acute intracranial hemorrhage.  No acute large vessel territory infarct. No mass lesion, midline shift or mass effect. Diffuse ventricular prominence related global parenchymal volume loss of hydrocephalus. No extra-axial fluid collection. Vascular: No asymmetric hyperdense vessel. Scattered vascular calcifications noted within the carotid siphons. Skull: Scalp soft tissues and calvarium within normal limits. Sinuses/Orbits: Globes and orbital soft tissues within normal limits. Mild scattered mucosal thickening within the ethmoidal air cells. Paranasal sinuses are otherwise clear. Small right mastoid effusion noted, of doubtful significance. Other: None. ASPECTS [facility] Stroke Program Early CT Score) - Ganglionic level infarction (caudate, lentiform nuclei, internal capsule, insula, M1-M3 cortex): 7 - Supraganglionic infarction (M4-M6 cortex): 3 Total score (0-10 with 10 being normal): 10 IMPRESSION: 1. No acute intracranial infarct or other process identified. 2. ASPECTS is 10. 3. 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 Critical Value/emergent results were called by telephone at the time of interpretation on 12/14/2017 at 11:33 pm to Dr. Lesly Rubenstein SUNG , who verbally acknowledged these results. Electronically Signed   By: Rise Mu M.D.   On: 12/14/2017 23:34     Management plans discussed with the patient, family and they  wanted to go home and refused  MRI, PT and OT evaluations..  CODE STATUS:     Code Status Orders  (From admission, onward)        Start     Ordered   12/15/17 0255  Full code  Continuous     12/15/17 0254    Code Status History    Date Active Date Inactive Code Status Order ID Comments User Context   11/26/2016 0436 11/26/2016 2152 Full Code [id number]  Sheryle Hail,  Kelton Pillar, MD Inpatient   07/23/2015 2045 07/27/2015 1916 Full Code [id number]  Hower, Cletis [place], MD ED    Advance Directive Documentation     Most Recent Value  Type of Advance Directive   Healthcare Power of Attorney  Pre-existing out of facility DNR order (yellow form or pink MOST form)  -  "MOST" Form in Place?  -      TOTAL TIME TAKING CARE OF THIS PATIENT: 35 minutes.    Alford Highland M.D on 12/15/2017 at 1:44 PM  Between 7am to 6pm - Pager - [phone]  After 6pm go to [email or web address] - password [email or web address]  [employer] Office  [phone]  CC: Primary care physician; Linfors, Leane Call, MD

## 2017-12-15 NOTE — Consult Note (Signed)
Reason for Consult: aphasia  Referring Physician: Dr. Renae Gloss   CC: aphasia   HPI: Maurice Moses is an 72 y.o. 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 Symptoms lasted about 20 minutes and self resolved. Pt has hx of stroke in 2016 which caused vertigo.  At baseline on ASA and Plavix.     Past Medical History:  Diagnosis Date  . Cerebellar stroke (HCC)   . Depression   . Hypertension   . IBS (irritable bowel syndrome)   . Stroke (cerebrum) (HCC)   . Stroke (HCC)   . Subdural hemorrhage [facility])     Past Surgical History:  Procedure Laterality Date  . LOOP RECORDER INSERTION      Family History  Problem Relation Age of Onset  . Hypertension Other     Social History:  reports that he has quit smoking. He has never used smokeless tobacco. He reports that he drinks alcohol. He reports that he does not use drugs.  Allergies  Allergen Reactions  . Doxycycline Nausea And Vomiting  . Morphine And Related Other (See Comments)    Agitation - wife does not want him to have it    Medications: I have reviewed the patient's current medications.  ROS: History obtained from the patient  General ROS: negative for - chills, fatigue, fever, night sweats, weight gain or weight loss Psychological ROS: negative for - behavioral disorder, hallucinations, memory difficulties, mood swings or suicidal ideation Ophthalmic ROS: negative for - blurry vision, double vision, eye pain or loss of vision ENT ROS: negative for - epistaxis, nasal discharge, oral lesions, sore throat, tinnitus or vertigo Allergy and Immunology ROS: negative for - hives or itchy/watery eyes Hematological and Lymphatic ROS: negative for - bleeding problems, bruising or swollen lymph nodes Endocrine ROS: negative for - galactorrhea, hair pattern changes, polydipsia/polyuria or temperature  intolerance Respiratory ROS: negative for - cough, hemoptysis, shortness of breath or wheezing Cardiovascular ROS: negative for - chest pain, dyspnea on exertion, edema or irregular heartbeat Gastrointestinal ROS: negative for - abdominal pain, diarrhea, hematemesis, nausea/vomiting or stool incontinence Genito-Urinary ROS: negative for - dysuria, hematuria, incontinence or urinary frequency/urgency Musculoskeletal ROS: negative for - joint swelling or muscular weakness Neurological ROS: as noted in HPI Dermatological ROS: negative for rash and skin lesion changes  Physical Examination: Blood pressure 124/70, pulse (!) 57, temperature 98.7 F (37.1 C), temperature source Oral, resp. rate 16, height 5\' 9"  (1.753 m), weight 234 lb 12.8 oz (106.5 kg), SpO2 99 %.  Neurological Examination   Mental Status: Alert, oriented, thought content appropriate.  Speech fluent without evidence of aphasia.  Able to follow 3 step commands without difficulty. Cranial Nerves: II: Discs flat bilaterally; Visual fields grossly normal, pupils equal, round, reactive to light and accommodation III,IV, VI: ptosis not present, extra-ocular motions intact bilaterally V,VII: smile symmetric, facial light touch sensation normal bilaterally VIII: hearing normal bilaterally IX,X: gag reflex present XI: bilateral shoulder shrug XII: midline tongue extension Motor: Right : Upper extremity   5/5    Left:     Upper extremity   5/5  Lower extremity   5/5     Lower extremity   5/5 Tone and bulk:normal tone throughout; no atrophy noted Sensory: Pinprick and light touch intact throughout, bilaterally Deep Tendon Reflexes: 1+ and symmetric throughout Plantars: Right: downgoing   Left: downgoing Cerebellar: Not tested Gait: not tested  Laboratory Studies:   Basic Metabolic Panel: Recent Labs  Lab 12/14/17 2310  NA 133*  K 3.3*  CL 94*  CO2 30  GLUCOSE 113*  BUN 14  CREATININE 1.22  CALCIUM 9.8     Liver Function Tests: Recent Labs  Lab 12/14/17 2310  AST 26  ALT 27  ALKPHOS 74  BILITOT 0.6  PROT 7.0  ALBUMIN 4.4   No results for input(s): LIPASE, AMYLASE in the last 168 hours. No results for input(s): AMMONIA in the last 168 hours.  CBC: Recent Labs  Lab 12/14/17 2310  WBC 8.3  NEUTROABS 5.8  HGB 15.3  HCT 43.9  MCV 90.0  PLT 275    Cardiac Enzymes: Recent Labs  Lab 12/14/17 2310  TROPONINI <0.03    BNP: Invalid input(s): POCBNP  CBG: No results for input(s): GLUCAP in the last 168 hours.  Microbiology: Results for orders placed or performed during the hospital encounter of 11/25/16  Culture, blood (routine x 2)     Status: None   Collection Time: 11/26/16 12:17 AM  Result Value Ref Range Status   Specimen Description BLOOD RIGHT WRIST  Final   Special Requests   Final    BOTTLES DRAWN AEROBIC AND ANAEROBIC Blood Culture results may not be optimal due to an excessive volume of blood received in culture bottles   Culture NO GROWTH 5 DAYS  Final   Report Status 12/01/2016 FINAL  Final  Culture, blood (routine x 2)     Status: None   Collection Time: 11/26/16 12:54 AM  Result Value Ref Range Status   Specimen Description BLOOD BLOOD RIGHT HAND  Final   Special Requests   Final    BOTTLES DRAWN AEROBIC AND ANAEROBIC Blood Culture adequate volume   Culture NO GROWTH 5 DAYS  Final   Report Status 12/01/2016 FINAL  Final  Urine culture     Status: Abnormal   Collection Time: 11/26/16  1:25 AM  Result Value Ref Range Status   Specimen Description URINE, RANDOM  Final   Special Requests NONE  Final   Culture MULTIPLE SPECIES PRESENT, SUGGEST RECOLLECTION (A)  Final   Report Status 11/27/2016 FINAL  Final    Coagulation Studies: Recent Labs    12/14/17 2310  LABPROT 11.8  INR 0.87    Urinalysis:  Recent Labs  Lab 12/15/17 0229  COLORURINE STRAW*  LABSPEC 1.024  PHURINE 7.0  GLUCOSEU NEGATIVE  HGBUR NEGATIVE  BILIRUBINUR NEGATIVE   KETONESUR NEGATIVE  PROTEINUR NEGATIVE  NITRITE NEGATIVE  LEUKOCYTESUR NEGATIVE    Lipid Panel:     Component Value Date/Time   CHOL 139 11/26/2016 0519   TRIG 126 11/26/2016 0519   HDL 44 11/26/2016 0519   CHOLHDL 3.2 11/26/2016 0519   VLDL 25 11/26/2016 0519   LDLCALC 70 11/26/2016 0519    HgbA1C:  Lab Results  Component Value Date   HGBA1C 5.4 11/26/2016    Urine Drug Screen:      Component Value Date/Time   LABOPIA POSITIVE (A) 12/15/2017 0229   COCAINSCRNUR NONE DETECTED 12/15/2017 0229   LABBENZ NONE DETECTED 12/15/2017 0229   AMPHETMU NONE DETECTED 12/15/2017 0229   THCU NONE DETECTED 12/15/2017 0229   LABBARB NONE DETECTED 12/15/2017 0229    Alcohol Level:  Recent Labs  Lab 12/14/17 2310  ETH <10    Other results: EKG: normal EKG, normal sinus rhythm, unchanged from previous tracings.  Imaging: Ct Angio Head W Or Wo Contrast  Result Date: 12/15/2017 CLINICAL  DATA:  Initial evaluation for acute stroke, expressive aphasia. EXAM: CT ANGIOGRAPHY HEAD AND NECK CT PERFUSION BRAIN TECHNIQUE: Multidetector CT imaging of the head and neck was performed using the standard protocol during bolus administration of intravenous contrast. Multiplanar CT image reconstructions and MIPs were obtained to evaluate the vascular anatomy. Carotid stenosis measurements (when applicable) are obtained utilizing NASCET criteria, using the distal internal carotid diameter as the denominator. Multiphase CT imaging of the brain was performed following IV bolus contrast injection. Subsequent parametric perfusion maps were calculated using RAPID software. CONTRAST:  ISOVUE-370 IOPAMIDOL (ISOVUE-370) INJECTION 76% COMPARISON:  Prior CT from earlier the same day. FINDINGS: CTA NECK FINDINGS Aortic arch: Visualized aortic arch of normal caliber with normal 3 vessel morphology. No flow-limiting stenosis about the origin of the great vessels. Visualized subclavian arteries widely patent.  Right carotid system: Right common carotid artery patent from its origin to the bifurcation without stenosis. Minimal atheromatous plaque about the right bifurcation without hemodynamically significant stenosis. Right ICA widely patent distally to the skull base without stenosis, dissection, or occlusion. Left carotid system: Left common carotid artery patent from its origin to the bifurcation without stenosis. Calcified atheromatous plaque about the left bifurcation with associated short-segment stenosis of up to approximately 50% by NASCET criteria. Left ICA patent distally to the skull base without stenosis, dissection, or occlusion. Vertebral arteries: Both of the vertebral arteries arise from the subclavian arteries. Focal plaque at the origin of the vertebral arteries bilaterally with secondary mild to moderate stenosis, slightly worse on the right. Vertebral arteries otherwise widely patent within the neck without stenosis, dissection, or occlusion. Left vertebral artery slightly dominant. Skeleton: No acute osseus abnormality. No worrisome lytic or blastic osseous lesions. Moderate degenerative spondylolysis present at C5-6 and C6-7. Other neck: No acute soft tissue abnormality within the neck. Salivary glands within normal limits. No adenopathy. Thyroid normal. Upper chest: Visualized upper chest within normal limits. Visualized lungs are clear. Review of the MIP images confirms the above findings CTA HEAD FINDINGS Anterior circulation: Petrous segments widely patent bilaterally. Atheromatous plaque within the cavernous/supraclinoid ICAs with resultant moderate to severe stenosis on the right (series 7, image 118), with relatively mild stenosis on the left. Origin of the ophthalmic arteries patent. ICA termini widely patent. A1 segments, anterior communicating artery common anterior cerebral arteries widely patent and within normal limits. No M1 segment occlusion or stenosis. No proximal M2 occlusion.  Distal MCA branches well perfused and symmetric. Posterior circulation: Vertebral arteries widely patent to the vertebrobasilar junction. Posterior inferior cerebral arteries patent bilaterally. Basilar artery widely patent to its distal aspect. Superior cerebral arteries patent bilaterally. Both of the posterior cerebral arteries primarily supplied via the basilar and are well perfused to their distal aspects. Moderate to severe tandem proximal and mid left P2 stenoses noted. No significant stenosis on the right. Small right posterior communicating artery noted. Venous sinuses: Patent. Anatomic variants: No significant anatomic variant. No aneurysm or vascular abnormality. Delayed phase: Not performed. Review of the MIP images confirms the above findings CT Brain Perfusion Findings: CBF (<30%) Volume: 0mL Perfusion (Tmax>6.0s) volume: 5mL Mismatch Volume: 5mL Infarction Location:No acute infarct by CT perfusion. Mildly delayed perfusion within the left temporal region suspected to be related to left ICA stenosis. IMPRESSION: 1. Negative CTA for emergent large vessel occlusion. 2. No acute infarct by CT perfusion. 3. Atheromatous plaque about the left carotid bifurcation with associated stenosis of up to 50% by NASCET criteria. 4. Atheromatous stenosis at the supraclinoid right ICA  with moderate to severe stenosis. 5. Moderate to severe tandem proximal-mid left P2 stenoses. Electronically Signed   By: Rise Mu M.D.   On: 12/15/2017 00:46   Ct Angio Neck W And/or Wo Contrast  Result Date: 12/15/2017 CLINICAL DATA:  Initial evaluation for acute stroke, expressive aphasia. EXAM: CT ANGIOGRAPHY HEAD AND NECK CT PERFUSION BRAIN TECHNIQUE: Multidetector CT imaging of the head and neck was performed using the standard protocol during bolus administration of intravenous contrast. Multiplanar CT image reconstructions and MIPs were obtained to evaluate the vascular anatomy. Carotid stenosis measurements (when  applicable) are obtained utilizing NASCET criteria, using the distal internal carotid diameter as the denominator. Multiphase CT imaging of the brain was performed following IV bolus contrast injection. Subsequent parametric perfusion maps were calculated using RAPID software. CONTRAST:  ISOVUE-370 IOPAMIDOL (ISOVUE-370) INJECTION 76% COMPARISON:  Prior CT from earlier the same day. FINDINGS: CTA NECK FINDINGS Aortic arch: Visualized aortic arch of normal caliber with normal 3 vessel morphology. No flow-limiting stenosis about the origin of the great vessels. Visualized subclavian arteries widely patent. Right carotid system: Right common carotid artery patent from its origin to the bifurcation without stenosis. Minimal atheromatous plaque about the right bifurcation without hemodynamically significant stenosis. Right ICA widely patent distally to the skull base without stenosis, dissection, or occlusion. Left carotid system: Left common carotid artery patent from its origin to the bifurcation without stenosis. Calcified atheromatous plaque about the left bifurcation with associated short-segment stenosis of up to approximately 50% by NASCET criteria. Left ICA patent distally to the skull base without stenosis, dissection, or occlusion. Vertebral arteries: Both of the vertebral arteries arise from the subclavian arteries. Focal plaque at the origin of the vertebral arteries bilaterally with secondary mild to moderate stenosis, slightly worse on the right. Vertebral arteries otherwise widely patent within the neck without stenosis, dissection, or occlusion. Left vertebral artery slightly dominant. Skeleton: No acute osseus abnormality. No worrisome lytic or blastic osseous lesions. Moderate degenerative spondylolysis present at C5-6 and C6-7. Other neck: No acute soft tissue abnormality within the neck. Salivary glands within normal limits. No adenopathy. Thyroid normal. Upper chest: Visualized upper chest  within normal limits. Visualized lungs are clear. Review of the MIP images confirms the above findings CTA HEAD FINDINGS Anterior circulation: Petrous segments widely patent bilaterally. Atheromatous plaque within the cavernous/supraclinoid ICAs with resultant moderate to severe stenosis on the right (series 7, image 118), with relatively mild stenosis on the left. Origin of the ophthalmic arteries patent. ICA termini widely patent. A1 segments, anterior communicating artery common anterior cerebral arteries widely patent and within normal limits. No M1 segment occlusion or stenosis. No proximal M2 occlusion. Distal MCA branches well perfused and symmetric. Posterior circulation: Vertebral arteries widely patent to the vertebrobasilar junction. Posterior inferior cerebral arteries patent bilaterally. Basilar artery widely patent to its distal aspect. Superior cerebral arteries patent bilaterally. Both of the posterior cerebral arteries primarily supplied via the basilar and are well perfused to their distal aspects. Moderate to severe tandem proximal and mid left P2 stenoses noted. No significant stenosis on the right. Small right posterior communicating artery noted. Venous sinuses: Patent. Anatomic variants: No significant anatomic variant. No aneurysm or vascular abnormality. Delayed phase: Not performed. Review of the MIP images confirms the above findings CT Brain Perfusion Findings: CBF (<30%) Volume: 0mL Perfusion (Tmax>6.0s) volume: 5mL Mismatch Volume: 5mL Infarction Location:No acute infarct by CT perfusion. Mildly delayed perfusion within the left temporal region suspected to be related to left ICA stenosis.  IMPRESSION: 1. Negative CTA for emergent large vessel occlusion. 2. No acute infarct by CT perfusion. 3. Atheromatous plaque about the left carotid bifurcation with associated stenosis of up to 50% by NASCET criteria. 4. Atheromatous stenosis at the supraclinoid right ICA with moderate to severe  stenosis. 5. Moderate to severe tandem proximal-mid left P2 stenoses. Electronically Signed   By: Rise Mu M.D.   On: 12/15/2017 00:46   Ct Cerebral Perfusion W Contrast  Result Date: 12/15/2017 CLINICAL DATA:  Initial evaluation for acute stroke, expressive aphasia. EXAM: CT ANGIOGRAPHY HEAD AND NECK CT PERFUSION BRAIN TECHNIQUE: Multidetector CT imaging of the head and neck was performed using the standard protocol during bolus administration of intravenous contrast. Multiplanar CT image reconstructions and MIPs were obtained to evaluate the vascular anatomy. Carotid stenosis measurements (when applicable) are obtained utilizing NASCET criteria, using the distal internal carotid diameter as the denominator. Multiphase CT imaging of the brain was performed following IV bolus contrast injection. Subsequent parametric perfusion maps were calculated using RAPID software. CONTRAST:  ISOVUE-370 IOPAMIDOL (ISOVUE-370) INJECTION 76% COMPARISON:  Prior CT from earlier the same day. FINDINGS: CTA NECK FINDINGS Aortic arch: Visualized aortic arch of normal caliber with normal 3 vessel morphology. No flow-limiting stenosis about the origin of the great vessels. Visualized subclavian arteries widely patent. Right carotid system: Right common carotid artery patent from its origin to the bifurcation without stenosis. Minimal atheromatous plaque about the right bifurcation without hemodynamically significant stenosis. Right ICA widely patent distally to the skull base without stenosis, dissection, or occlusion. Left carotid system: Left common carotid artery patent from its origin to the bifurcation without stenosis. Calcified atheromatous plaque about the left bifurcation with associated short-segment stenosis of up to approximately 50% by NASCET criteria. Left ICA patent distally to the skull base without stenosis, dissection, or occlusion. Vertebral arteries: Both of the vertebral arteries arise from the  subclavian arteries. Focal plaque at the origin of the vertebral arteries bilaterally with secondary mild to moderate stenosis, slightly worse on the right. Vertebral arteries otherwise widely patent within the neck without stenosis, dissection, or occlusion. Left vertebral artery slightly dominant. Skeleton: No acute osseus abnormality. No worrisome lytic or blastic osseous lesions. Moderate degenerative spondylolysis present at C5-6 and C6-7. Other neck: No acute soft tissue abnormality within the neck. Salivary glands within normal limits. No adenopathy. Thyroid normal. Upper chest: Visualized upper chest within normal limits. Visualized lungs are clear. Review of the MIP images confirms the above findings CTA HEAD FINDINGS Anterior circulation: Petrous segments widely patent bilaterally. Atheromatous plaque within the cavernous/supraclinoid ICAs with resultant moderate to severe stenosis on the right (series 7, image 118), with relatively mild stenosis on the left. Origin of the ophthalmic arteries patent. ICA termini widely patent. A1 segments, anterior communicating artery common anterior cerebral arteries widely patent and within normal limits. No M1 segment occlusion or stenosis. No proximal M2 occlusion. Distal MCA branches well perfused and symmetric. Posterior circulation: Vertebral arteries widely patent to the vertebrobasilar junction. Posterior inferior cerebral arteries patent bilaterally. Basilar artery widely patent to its distal aspect. Superior cerebral arteries patent bilaterally. Both of the posterior cerebral arteries primarily supplied via the basilar and are well perfused to their distal aspects. Moderate to severe tandem proximal and mid left P2 stenoses noted. No significant stenosis on the right. Small right posterior communicating artery noted. Venous sinuses: Patent. Anatomic variants: No significant anatomic variant. No aneurysm or vascular abnormality. Delayed phase: Not performed.  Review of the MIP images confirms  the above findings CT Brain Perfusion Findings: CBF (<30%) Volume: 0mL Perfusion (Tmax>6.0s) volume: 5mL Mismatch Volume: 5mL Infarction Location:No acute infarct by CT perfusion. Mildly delayed perfusion within the left temporal region suspected to be related to left ICA stenosis. IMPRESSION: 1. Negative CTA for emergent large vessel occlusion. 2. No acute infarct by CT perfusion. 3. Atheromatous plaque about the left carotid bifurcation with associated stenosis of up to 50% by NASCET criteria. 4. 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 5. Moderate to severe tandem proximal-mid left P2 stenoses. Electronically Signed   By: Rise Mu M.D.   On: 12/15/2017 00:46   Ct Head Code Stroke Wo Contrast  Result Date: 12/14/2017 CLINICAL DATA:  Code stroke. Initial evaluation for acute expressive aphasia. EXAM: CT HEAD WITHOUT CONTRAST TECHNIQUE: Contiguous axial images were obtained from the base of the skull through the vertex without intravenous contrast. COMPARISON:  Prior CT from 05/23/2017. FINDINGS: Brain: Atrophy with chronic small vessel ischemic disease. Scatter remote bilateral cerebellar infarcts, left greater than right. No acute intracranial hemorrhage. No acute large vessel territory infarct. No mass lesion, midline shift or mass effect. Diffuse ventricular prominence related global parenchymal volume loss of hydrocephalus. No extra-axial fluid collection. Vascular: No asymmetric hyperdense vessel. Scattered vascular calcifications noted within the carotid siphons. Skull: Scalp soft tissues and calvarium within normal limits. Sinuses/Orbits: Globes and orbital soft tissues within normal limits. Mild scattered mucosal thickening within the ethmoidal air cells. Paranasal sinuses are otherwise clear. Small right mastoid effusion noted, of doubtful significance. Other: None. ASPECTS [facility] Stroke Program Early CT Score)  - Ganglionic level infarction (caudate, lentiform nuclei, internal capsule, insula, M1-M3 cortex): 7 - Supraganglionic infarction (M4-M6 cortex): 3 Total score (0-10 with 10 being normal): 10 IMPRESSION: 1. No acute intracranial infarct or other process identified. 2. ASPECTS is 10. 3. 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 Critical Value/emergent results were called by telephone at the time of interpretation on 12/14/2017 at 11:33 pm to Dr. Lesly Rubenstein SUNG , who verbally acknowledged these results. Electronically Signed   By: Rise Mu M.D.   On: 12/14/2017 23:34     Assessment/Plan:  72 y.o. 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 Symptoms lasted about 20 minutes and self resolved. Pt has hx of stroke in 2016 which caused vertigo.  At baseline on ASA and Plavix.   -  CTH no acute abnormalities. CTA chronic P2 stenosis - I don't think MRI will make a difference or any change - Agree with d/c - Start home ASA and Plavix - Likely TIA 12/15/2017, 11:13 AM

## 2017-12-15 NOTE — Progress Notes (Signed)
Patient became confused throughout the night. She forgot she was at the hospital and began yelling out for her daughter. Patient was easily redirected. Patient was not impulsive.

## 2017-12-15 NOTE — ED Notes (Signed)
Pt returned to room, teleneuro assessing pt at this time with this RN

## 2017-12-15 NOTE — ED Notes (Signed)
Neuro screen complete att

## 2017-12-15 NOTE — Care Management Obs Status (Signed)
MEDICARE OBSERVATION STATUS NOTIFICATION   Patient Details  Name: Maurice Moses MRN: [id number] Date of Birth: [birth year]   Medicare Observation Status Notification Given:  Yes    Marily Memos, RN 12/15/2017, 8:51 AM

## 2017-12-15 NOTE — Progress Notes (Signed)
Education given, discharge instructions given. IV discontinued.

## 2017-12-15 NOTE — Plan of Care (Signed)
Patient was admitted about 0300 without any complications. Spouse is at the bedside. Patient exhibits no stroke like symptoms. Patient is resting. Patient profile completed.

## 2019-08-14 ENCOUNTER — Observation Stay: Payer: Medicare Other

## 2019-08-14 ENCOUNTER — Emergency Department: Payer: Medicare Other

## 2019-08-14 ENCOUNTER — Other Ambulatory Visit: Payer: Self-pay

## 2019-08-14 ENCOUNTER — Observation Stay
Admission: EM | Admit: 2019-08-14 | Discharge: 2019-08-15 | Disposition: A | Discharge status: Home / Self Care | Payer: Medicare Other | Attending: Internal Medicine | Admitting: Internal Medicine

## 2019-08-14 ENCOUNTER — Encounter: Payer: Self-pay | Admitting: Emergency Medicine

## 2019-08-14 DIAGNOSIS — Z66 Do not resuscitate: Secondary | ICD-10-CM | POA: Insufficient documentation

## 2019-08-14 DIAGNOSIS — Z8249 Family history of ischemic heart disease and other diseases of the circulatory system: Secondary | ICD-10-CM | POA: Diagnosis not present

## 2019-08-14 DIAGNOSIS — I10 Essential (primary) hypertension: Secondary | ICD-10-CM | POA: Diagnosis not present

## 2019-08-14 DIAGNOSIS — I639 Cerebral infarction, unspecified: Secondary | ICD-10-CM | POA: Diagnosis not present

## 2019-08-14 DIAGNOSIS — K589 Irritable bowel syndrome without diarrhea: Secondary | ICD-10-CM | POA: Diagnosis not present

## 2019-08-14 DIAGNOSIS — R4701 Aphasia: Secondary | ICD-10-CM | POA: Diagnosis not present

## 2019-08-14 DIAGNOSIS — R2681 Unsteadiness on feet: Secondary | ICD-10-CM | POA: Diagnosis not present

## 2019-08-14 DIAGNOSIS — I63532 Cerebral infarction due to unspecified occlusion or stenosis of left posterior cerebral artery: Secondary | ICD-10-CM

## 2019-08-14 DIAGNOSIS — Z7902 Long term (current) use of antithrombotics/antiplatelets: Secondary | ICD-10-CM | POA: Diagnosis not present

## 2019-08-14 DIAGNOSIS — Z20828 Contact with and (suspected) exposure to other viral communicable diseases: Secondary | ICD-10-CM | POA: Insufficient documentation

## 2019-08-14 DIAGNOSIS — F329 Major depressive disorder, single episode, unspecified: Secondary | ICD-10-CM | POA: Diagnosis not present

## 2019-08-14 DIAGNOSIS — Z8673 Personal history of transient ischemic attack (TIA), and cerebral infarction without residual deficits: Secondary | ICD-10-CM | POA: Insufficient documentation

## 2019-08-14 DIAGNOSIS — Z87891 Personal history of nicotine dependence: Secondary | ICD-10-CM | POA: Insufficient documentation

## 2019-08-14 DIAGNOSIS — Z881 Allergy status to other antibiotic agents status: Secondary | ICD-10-CM | POA: Insufficient documentation

## 2019-08-14 DIAGNOSIS — I6521 Occlusion and stenosis of right carotid artery: Secondary | ICD-10-CM | POA: Diagnosis not present

## 2019-08-14 DIAGNOSIS — Z79899 Other long term (current) drug therapy: Secondary | ICD-10-CM | POA: Insufficient documentation

## 2019-08-14 DIAGNOSIS — Z885 Allergy status to narcotic agent status: Secondary | ICD-10-CM | POA: Insufficient documentation

## 2019-08-14 DIAGNOSIS — Z7982 Long term (current) use of aspirin: Secondary | ICD-10-CM | POA: Diagnosis not present

## 2019-08-14 DIAGNOSIS — F419 Anxiety disorder, unspecified: Secondary | ICD-10-CM | POA: Diagnosis not present

## 2019-08-14 LAB — CBC
HCT: 44.4 % (ref 39.0–52.0)
Hemoglobin: 15.8 g/dL (ref 13.0–17.0)
MCH: 30.5 pg (ref 26.0–34.0)
MCHC: 35.6 g/dL (ref 30.0–36.0)
MCV: 85.7 fL (ref 80.0–100.0)
Platelets: 294 10*3/uL (ref 150–400)
RBC: 5.18 MIL/uL (ref 4.22–5.81)
RDW: 13.4 % (ref 11.5–15.5)
WBC: 12.7 10*3/uL — ABNORMAL HIGH (ref 4.0–10.5)
nRBC: 0 % (ref 0.0–0.2)

## 2019-08-14 LAB — DIFFERENTIAL
Abs Immature Granulocytes: 0.05 10*3/uL (ref 0.00–0.07)
Basophils Absolute: 0.1 10*3/uL (ref 0.0–0.1)
Basophils Relative: 1 %
Eosinophils Absolute: 0.2 10*3/uL (ref 0.0–0.5)
Eosinophils Relative: 2 %
Immature Granulocytes: 0 %
Lymphocytes Relative: 13 %
Lymphs Abs: 1.7 10*3/uL (ref 0.7–4.0)
Monocytes Absolute: 0.7 10*3/uL (ref 0.1–1.0)
Monocytes Relative: 6 %
Neutro Abs: 10 10*3/uL — ABNORMAL HIGH (ref 1.7–7.7)
Neutrophils Relative %: 78 %

## 2019-08-14 LAB — PROTIME-INR
INR: 1 (ref 0.8–1.2)
Prothrombin Time: 12.8 seconds (ref 11.4–15.2)

## 2019-08-14 LAB — APTT: aPTT: 31 seconds (ref 24–36)

## 2019-08-14 LAB — GLUCOSE, CAPILLARY: Glucose-Capillary: 108 mg/dL — ABNORMAL HIGH (ref 70–99)

## 2019-08-14 MED ORDER — ACETAMINOPHEN 650 MG RE SUPP: 650.0000 mg | RECTAL | Status: DC | PRN

## 2019-08-14 MED ORDER — IOHEXOL 350 MG/ML SOLN
75.0000 mL | Freq: Once | INTRAVENOUS | Status: AC | PRN
  Administered 2019-08-14: 75 mL via INTRAVENOUS

## 2019-08-14 MED ORDER — ENOXAPARIN SODIUM 40 MG/0.4ML ~~LOC~~ SOLN
40.0000 mg | SUBCUTANEOUS | Status: DC
  Administered 2019-08-14: 40 mg via SUBCUTANEOUS
  Filled 2019-08-14: qty 0.4

## 2019-08-14 MED ORDER — ESCITALOPRAM OXALATE 10 MG PO TABS
20.0000 mg | ORAL_TABLET | Freq: Every day | ORAL | Status: DC
  Administered 2019-08-15: 20 mg via ORAL
  Filled 2019-08-14: qty 2

## 2019-08-14 MED ORDER — DOCUSATE SODIUM 100 MG PO CAPS
100.0000 mg | ORAL_CAPSULE | Freq: Three times a day (TID) | ORAL | Status: DC
  Administered 2019-08-15: 10:00:00 100 mg via ORAL
  Filled 2019-08-14 (×2): qty 1

## 2019-08-14 MED ORDER — CLORAZEPATE DIPOTASSIUM 7.5 MG PO TABS: 7.5000 mg | ORAL_TABLET | Freq: Every day | ORAL | Status: DC | PRN

## 2019-08-14 MED ORDER — CLOPIDOGREL BISULFATE 75 MG PO TABS
75.0000 mg | ORAL_TABLET | Freq: Every day | ORAL | Status: DC
  Administered 2019-08-15: 75 mg via ORAL
  Filled 2019-08-14: qty 1

## 2019-08-14 MED ORDER — VITAMIN D 25 MCG (1000 UNIT) PO TABS
2000.0000 [IU] | ORAL_TABLET | Freq: Every day | ORAL | Status: DC
  Administered 2019-08-15: 10:00:00 2000 [IU] via ORAL
  Filled 2019-08-14: qty 2

## 2019-08-14 MED ORDER — SENNOSIDES-DOCUSATE SODIUM 8.6-50 MG PO TABS: 1.0000 | ORAL_TABLET | Freq: Every evening | ORAL | Status: DC | PRN

## 2019-08-14 MED ORDER — PANTOPRAZOLE SODIUM 40 MG PO TBEC
40.0000 mg | DELAYED_RELEASE_TABLET | Freq: Every day | ORAL | Status: DC
  Filled 2019-08-14: qty 1

## 2019-08-14 MED ORDER — QUINAPRIL HCL 10 MG PO TABS
40.0000 mg | ORAL_TABLET | Freq: Every day | ORAL | Status: DC
  Filled 2019-08-14 (×2): qty 4

## 2019-08-14 MED ORDER — HYDROCODONE-ACETAMINOPHEN 5-325 MG PO TABS: 1.0000 | ORAL_TABLET | ORAL | Status: DC | PRN

## 2019-08-14 MED ORDER — SODIUM CHLORIDE 0.9% FLUSH: 3.0000 mL | Freq: Once | INTRAVENOUS | Status: DC

## 2019-08-14 MED ORDER — ATORVASTATIN CALCIUM 20 MG PO TABS
40.0000 mg | ORAL_TABLET | Freq: Every day | ORAL | Status: DC
  Administered 2019-08-14: 40 mg via ORAL
  Filled 2019-08-14: qty 2

## 2019-08-14 MED ORDER — ACETAMINOPHEN 160 MG/5ML PO SOLN
650.0000 mg | ORAL | Status: DC | PRN
  Filled 2019-08-14: qty 20.3

## 2019-08-14 MED ORDER — ACETAMINOPHEN 325 MG PO TABS: 650.0000 mg | ORAL_TABLET | ORAL | Status: DC | PRN

## 2019-08-14 MED ORDER — ASPIRIN EC 81 MG PO TBEC
81.0000 mg | DELAYED_RELEASE_TABLET | Freq: Every day | ORAL | Status: DC
  Administered 2019-08-15: 81 mg via ORAL
  Filled 2019-08-14: qty 1

## 2019-08-14 MED ORDER — STROKE: EARLY STAGES OF RECOVERY BOOK: Freq: Once | Status: AC

## 2019-08-14 MED ORDER — HYDROCHLOROTHIAZIDE 25 MG PO TABS
25.0000 mg | ORAL_TABLET | Freq: Every day | ORAL | Status: DC
  Administered 2019-08-15: 25 mg via ORAL
  Filled 2019-08-14: qty 1

## 2019-08-14 MED ORDER — MIRTAZAPINE 15 MG PO TABS
30.0000 mg | ORAL_TABLET | Freq: Every day | ORAL | Status: DC
  Administered 2019-08-14: 30 mg via ORAL
  Filled 2019-08-14 (×2): qty 2

## 2019-08-14 NOTE — ED Notes (Signed)
Report called but this RN unable to obtain transport to the floor

## 2019-08-14 NOTE — ED Triage Notes (Signed)
Per wife, patient woke up this morning at around 0600 and when he came back from the bathroom he was speaking 'gibberish'.  Went to bed last night at around 0001.

## 2019-08-14 NOTE — H&P (Addendum)
History and Physical:    Maurice FootsClaude Moses   WUJ:[id number] DOB: 07/29/1946 DOA: 08/14/2019  Referring MD/provider: Dr. Paschal DoppSarah monks PCP: Doug SouLinfors, Leane CallEugene W, MD   Patient coming from: Home  Chief Complaint: Difficulty with speech  History of Present Illness:   Maurice FootsClaude Moses is an 73 y.o. male with history of cerebellar stroke, depression, hypertension, IBS, subdural hemorrhage, who presented to the hospital today because of speech problems.  According to his wife, they went to bed between 12 and 1 AM this morning and at that time he was fine.  They woke up around 6 AM this morning and his wife noticed that the patient could not get his words out and speech was gibberish.  Patient was confused.  This persisted so patient was brought to the emergency room for further evaluation.  According to his wife, since his last stroke, patient has had memory problems and has had trouble with numbers and time.  He has also had difficulty with balance and also he "furniture walks".  He does not use any assistive device at home and he manages well.  No unilateral weakness or numbness in extremities, no slurred speech, no problems with vision, no headache, dizziness, fever, chills, vomiting, diarrhea, abdominal pain, shortness of breath, chest pain or cough.  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  Dr. Colon BranchMonks, ED physician, have discussed the case with neurologist on-call and patient was deemed not to be a candidate for TPA because he was outside the window for TPA.  It was recommended that patient be admitted to the hospital for further work-up/management.  ROS:   ROS all other systems reviewed were  negative.  Past Medical History:   Past Medical History:  Diagnosis Date  . Cerebellar stroke (HCC)   . Depression   . Hypertension   . IBS (irritable bowel syndrome)   . Stroke (cerebrum) (HCC)   . Stroke (HCC)   . Subdural hemorrhage [facility](HCC)     Past Surgical History:   Past Surgical History:  Procedure Laterality Date  . LOOP RECORDER INSERTION      Social History:   Social History   Socioeconomic History  . Marital status: Married    Spouse name: Not on file  . Number of children: Not on file  . Years of education: Not on file  . Highest education level: Not on file  Occupational History  . Not on file  Tobacco Use  . Smoking status: Former Games developermoker  . Smokeless tobacco: Never Used  Substance and Sexual Activity  . Alcohol use: Yes    Comment: occ  . Drug use: No  . Sexual activity: Not on file  Other Topics Concern  . Not on file  Social History Narrative  . Not on file   Social Determinants of Health   Financial Resource Strain:   . Difficulty of Paying Living Expenses: Not on file  Food Insecurity:   . Worried About Programme researcher, broadcasting/film/videounning Out of Food in the Last Year: Not on file  . Ran Out of Food in the Last Year: Not on file  Transportation Needs:   . Lack of Transportation (Medical): Not on file  . Lack of Transportation (Non-Medical): Not on file  Physical Activity:   . Days of Exercise per Week: Not on file  . Minutes of Exercise per Session: Not on file  Stress:   . Feeling of Stress : Not on file  Social Connections:   . Frequency of Communication with Friends and Family: Not on file  . Frequency of Social Gatherings with Friends and Family: Not on file  . Attends Religious Services: Not on file  . Active Member of Clubs or Organizations: Not on file  . Attends Archivist Meetings: Not on file  . Marital Status: Not on file  Intimate Partner Violence:   . Fear of Current or Ex-Partner: Not on file  . Emotionally Abused: Not on file  .  Physically Abused: Not on file  . Sexually Abused: Not on file    Allergies   Doxycycline and Morphine and related  Family history:   Family History  Problem Relation Age of Onset  . Hypertension Other     Current Medications:   Prior to Admission medications   Medication Sig Start Date End Date Taking? Authorizing Provider  aspirin EC 81 MG EC tablet Take 1 tablet (81 mg total) by mouth daily. 07/26/15   Dustin Flock, MD  atorvastatin (LIPITOR) 40 MG tablet Take 1 tablet (40 mg total) by mouth daily at 6 PM. 07/26/15   Dustin Flock, MD  cetirizine (ZYRTEC) 10 MG tablet Take 10 mg by mouth daily.    [provider]  cholecalciferol (VITAMIN D) 1000 units tablet Take 2,000 Units by mouth daily.    [provider]  clopidogrel (PLAVIX) 75 MG tablet Take 1 tablet (75 mg total) by mouth daily. 07/26/15   Dustin Flock, MD  clorazepate (TRANXENE) 7.5 MG tablet Take 1 tablet by mouth daily as needed for anxiety.     [provider]  docusate sodium (COLACE) 100 MG capsule Take 100 mg by mouth 3 (three) times daily.     [provider]  escitalopram (LEXAPRO) 20 MG tablet Take 20 mg by mouth daily.    [provider]  esomeprazole (NEXIUM) 40 MG capsule Take 40 mg by mouth daily at 12 noon.    [provider]  hydrochlorothiazide (HYDRODIURIL) 25 MG tablet Take 25 mg by mouth daily.    [provider]  HYDROcodone-acetaminophen (NORCO/VICODIN) 5-325 MG tablet Take 1-2 tablets by mouth every 4 (four) hours as needed for moderate pain (Max 7 per day). 07/26/15   Dustin Flock, MD  mirtazapine (REMERON SOL-TAB) 30 MG disintegrating tablet Take 1 tablet by mouth daily.    [provider]  Omega-3 Fatty Acids (FISH OIL) 1000 MG CAPS Take 1,000 mg by mouth every morning.    [provider]  quinapril (ACCUPRIL) 40 MG tablet Take 40 mg by mouth at bedtime.    [provider]    Physical Exam:    Vitals:   08/14/19 1311 08/14/19 1359  BP:  (!) 176/96  Pulse:  67  Resp:  13  Temp:  98.9 F (37.2 C)  SpO2:  94%  Weight: 106.5 kg      Physical Exam: Blood pressure (!) 176/96, pulse 67, temperature 98.9 F (37.2 C), resp. rate 13, weight 106.5 kg, SpO2 94 %. Gen: No acute distress. Head: Normocephalic, atraumatic. Eyes: Pupils equal, round and reactive to light. Extraocular movements intact.  Sclerae nonicteric. No lid lag. Mouth: Moist mucous membranes, no pharyngeal exudates or erythema Neck: Supple, no thyromegaly, no lymphadenopathy, no jugular venous distention. Chest:  Lungs are clear to auscultation with good air movement. No rales, rhonchi or wheezes.  CV: Heart sounds are regular with an S1, S2. No murmurs, rubs, or gallops.  Abdomen: Soft, nontender, nondistended with normal active bowel sounds. No  palpable masses. Extremities: Extremities are without clubbing, or cyanosis.  Bilateral leg and ankle edema pain (1+). Pedal pulses 2+.  Skin: Warm and dry. No rashes, lesions or wounds. Neuro: Alert and oriented times 2 (person and place); grossly nonfocal.  Psych: Insight is good and judgment is appropriate. Mood and affect normal.   Data Review:    Labs: Basic Metabolic Panel: No results for input(s): NA, K, CL, CO2, GLUCOSE, BUN, CREATININE, CALCIUM, MG, PHOS in the last 168 hours. Liver Function Tests: No results for input(s): AST, ALT, ALKPHOS, BILITOT, PROT, ALBUMIN in the last 168 hours. No results for input(s): LIPASE, AMYLASE in the last 168 hours. No results for input(s): AMMONIA in the last 168 hours. CBC: Recent Labs  Lab 08/14/19 1318  WBC 12.7*  NEUTROABS 10.0*  HGB 15.8  HCT 44.4  MCV 85.7  PLT 294   Cardiac Enzymes: No results for input(s): CKTOTAL, CKMB, CKMBINDEX, TROPONINI in the last 168 hours.  BNP (last 3 results) No results for input(s): PROBNP in the last 8760 hours. CBG: Recent Labs  Lab 08/14/19 1318  GLUCAP 108*     Urinalysis    Component Value Date/Time   COLORURINE STRAW (A) 12/15/2017 0229   APPEARANCEUR CLEAR (A) 12/15/2017 0229   LABSPEC 1.024 12/15/2017 0229   PHURINE 7.0 12/15/2017 0229   GLUCOSEU NEGATIVE 12/15/2017 0229   HGBUR NEGATIVE 12/15/2017 0229   BILIRUBINUR NEGATIVE 12/15/2017 0229   KETONESUR NEGATIVE 12/15/2017 0229   PROTEINUR NEGATIVE 12/15/2017 0229   NITRITE NEGATIVE 12/15/2017 0229   LEUKOCYTESUR NEGATIVE 12/15/2017 0229      Radiographic Studies: CT Angio Head W/Cm &/Or Wo Cm  Result Date: 08/14/2019 CLINICAL DATA:  Receptive and expressive aphasia. EXAM: CT ANGIOGRAPHY HEAD AND NECK TECHNIQUE: Multidetector CT imaging of the head and neck was performed using the standard protocol during bolus administration of intravenous contrast. Multiplanar CT image reconstructions and MIPs were obtained to evaluate the vascular anatomy. Carotid stenosis measurements (when applicable) are obtained utilizing NASCET criteria, using the distal internal carotid diameter as the denominator. CONTRAST:  75mL OMNIPAQUE IOHEXOL 350 MG/ML SOLN COMPARISON:  CT head without contrast and CTA head 12/15/2017 FINDINGS: CT HEAD FINDINGS Brain: Moderate atrophy and white matter disease is stable. Acute cortical infarct is present. Basal ganglia are intact. Insular ribbon is normal. A remote infarct of the left cerebellar peduncle is stable. The ventricles are proportionate to the degree of atrophy. No significant extraaxial fluid collection is present. Vascular: Atherosclerotic calcifications are present within the cavernous internal carotid arteries bilaterally. There is no hyperdense vessel. Skull: Calvarium is intact. No focal lytic or blastic lesions are present. No significant extracranial soft tissue lesion is present. Sinuses: The paranasal sinuses and mastoid air cells are clear. Orbits: The globes and orbits are within normal limits. Review of the MIP images confirms the above findings CTA NECK  FINDINGS Aortic arch: A 3 vessel arch configuration is present. No significant atherosclerotic change or stenosis is present at the arch. Right carotid system: The right common carotid artery is within normal limits. Minimal atherosclerotic changes again noted at the bifurcation without significant stenosis. There is moderate tortuosity of the cervical right ICA without significant stenosis. Left carotid system: The left common carotid artery is within normal limits.  Mild atherosclerotic changes are noted at the bifurcation without significant stenosis. There is moderate tortuosity of the cervical left ICA without significant stenosis. Vertebral arteries: The left vertebral artery is slightly dominant to the right. Moderate stenosis is again noted at the origin of the right vertebral artery. There is no other significant stenosis of either vertebral artery in the neck. Skeleton: There straightening of normal cervical lordosis. Slight anterolisthesis at C4-5 is stable. Endplate degenerative changes are most evident C5-6 and C6-7. No focal lytic or blastic lesions are present. Other neck: The soft tissues the neck otherwise unremarkable. Upper chest: Mild dependent atelectasis is present. Lungs are otherwise clear. Thoracic inlet is normal. Review of the MIP images confirms the above findings CTA HEAD FINDINGS Anterior circulation: Atherosclerotic calcifications are again noted within the cavernous internal carotid arteries bilaterally. Moderate stenosis of the supraclinoid right ICA is similar the prior study. More distal terminal right ICA is normal. There is no significant stenosis of greater than 50% on the left. The A1 and M1 segments are normal. Anterior communicating artery is patent. MCA bifurcations are intact. The ACA and MCA branch vessels are normal. Posterior circulation: Left vertebral artery is the dominant vessel. AICA vessels are dominant. Vertebrobasilar junction is normal. Both posterior cerebral  arteries originate from the basilar tip. Tandem high-grade stenoses of the proximal left posterior cerebral artery are stable. Distal branch vessels opacify bilaterally. Venous sinuses: The dural sinuses are patent. The straight sinus and deep cerebral veins are intact. Cortical veins are unremarkable. Right transverse sinus is dominant. No vascular malformation is evident. Anatomic variants: None Review of the MIP images confirms the above findings IMPRESSION: 1. 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 2. Stable moderate stenosis of the supraclinoid right ICA. 3. Stable moderate tandem stenoses of the proximal left posterior cerebral artery. 4. No other significant proximal stenosis, aneurysm, or branch vessel occlusion within the Circle of Willis. 5. Stable moderate atrophy and white matter disease. 6. Remote infarct of the left cerebellar peduncle. Electronically Signed   By: Marin Roberts M.D.   On: 08/14/2019 14:12   CT Angio Neck W and/or Wo Contrast  Result Date: 08/14/2019 CLINICAL DATA:  Receptive and expressive aphasia. EXAM: CT ANGIOGRAPHY HEAD AND NECK TECHNIQUE: Multidetector CT imaging of the head and neck was performed using the standard protocol during bolus administration of intravenous contrast. Multiplanar CT image reconstructions and MIPs were obtained to evaluate the vascular anatomy. Carotid stenosis measurements (when applicable) are obtained utilizing NASCET criteria, using the distal internal carotid diameter as the denominator. CONTRAST:  60mL OMNIPAQUE IOHEXOL 350 MG/ML SOLN COMPARISON:  CT head without contrast and CTA head 12/15/2017 FINDINGS: CT HEAD FINDINGS Brain: Moderate atrophy and white matter disease is stable. Acute cortical infarct is present. Basal ganglia are intact. Insular ribbon is normal. A remote infarct of the left cerebellar peduncle is stable. The ventricles are proportionate to the degree of atrophy. No significant  extraaxial fluid collection is present. Vascular: Atherosclerotic calcifications are present within the cavernous internal carotid arteries bilaterally. There is no hyperdense vessel. Skull: Calvarium is intact. No focal lytic or blastic lesions are present. No significant extracranial soft tissue lesion is present. Sinuses: The paranasal sinuses and mastoid air cells are clear. Orbits: The globes and orbits are within normal limits. Review of the MIP images confirms the above findings CTA NECK FINDINGS Aortic arch: A 3 vessel arch configuration is present. No significant atherosclerotic change or stenosis is present at the  arch. Right carotid system: The right common carotid artery is within normal limits. Minimal atherosclerotic changes again noted at the bifurcation without significant stenosis. There is moderate tortuosity of the cervical right ICA without significant stenosis. Left carotid system: The left common carotid artery is within normal limits. Mild atherosclerotic changes are noted at the bifurcation without significant stenosis. There is moderate tortuosity of the cervical left ICA without significant stenosis. Vertebral arteries: The left vertebral artery is slightly dominant to the right. Moderate stenosis is again noted at the origin of the right vertebral artery. There is no other significant stenosis of either vertebral artery in the neck. Skeleton: There straightening of normal cervical lordosis. Slight anterolisthesis at C4-5 is stable. Endplate degenerative changes are most evident C5-6 and C6-7. No focal lytic or blastic lesions are present. Other neck: The soft tissues the neck otherwise unremarkable. Upper chest: Mild dependent atelectasis is present. Lungs are otherwise clear. Thoracic inlet is normal. Review of the MIP images confirms the above findings CTA HEAD FINDINGS Anterior circulation: Atherosclerotic calcifications are again noted within the cavernous internal carotid arteries  bilaterally. Moderate stenosis of the supraclinoid right ICA is similar the prior study. More distal terminal right ICA is normal. There is no significant stenosis of greater than 50% on the left. The A1 and M1 segments are normal. Anterior communicating artery is patent. MCA bifurcations are intact. The ACA and MCA branch vessels are normal. Posterior circulation: Left vertebral artery is the dominant vessel. AICA vessels are dominant. Vertebrobasilar junction is normal. Both posterior cerebral arteries originate from the basilar tip. Tandem high-grade stenoses of the proximal left posterior cerebral artery are stable. Distal branch vessels opacify bilaterally. Venous sinuses: The dural sinuses are patent. The straight sinus and deep cerebral veins are intact. Cortical veins are unremarkable. Right transverse sinus is dominant. No vascular malformation is evident. Anatomic variants: None Review of the MIP images confirms the above findings IMPRESSION: 1. 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 2. Stable moderate stenosis of the supraclinoid right ICA. 3. Stable moderate tandem stenoses of the proximal left posterior cerebral artery. 4. No other significant proximal stenosis, aneurysm, or branch vessel occlusion within the Circle of Willis. 5. Stable moderate atrophy and white matter disease. 6. Remote infarct of the left cerebellar peduncle. Electronically Signed   By: Marin Roberts M.D.   On: 08/14/2019 14:12    EKG: Independently reviewed.  Normal sinus rhythm, left axis deviation, low voltage in precordial leads   Assessment/Plan:   Active Problems:   Aphasia   Body mass index is 34.67 kg/m.   Aphasia in a patient with history of cerebellar stroke: Probably from TIA versus acute stroke.  Admit to telemetry.  Obtain MRI brain without contrast and 2D echo for further evaluation.  Continue Lipitor, aspirin and Plavix which patient was taking at home.   Consult PT, OT and ST.  Hypertension: Continue antihypertensives  Depression: Continue Lexapro and Remeron.   Other information:   DVT prophylaxis: Lovenox Code Status: DNR. Family Communication: Plan discussed with his wife at the bedside Disposition Plan: Possible discharge to home tomorrow Consults called: Tele neurologist Admission status: Observation   The medical decision making is of moderate complexity, therefore this is a level 2 visit.  Time spent 50 minutes  Theoren Palka [facility]   How to contact the [facility] Attending or Consulting provider 7A - 7P or covering provider during after hours 7P -7A, for this patient?   1.  Check the care team in [facility] and look for a) attending/consulting [facility] provider listed and b) the [facility] team listed 2. Log into [email or web address] and use [place]'s universal password to access. If you do not have the password, please contact the hospital operator. 3. Locate the [facility] provider you are looking for under [facility] and page to a number that you can be directly reached. 4. If you still have difficulty reaching the provider, please page the [facility] (Director on Call) for the Hospitalists listed on amion for assistance.  08/14/2019, 3:29 PM

## 2019-08-14 NOTE — ED Provider Notes (Signed)
[facility] Emergency Department Provider Note  ____________________________________________   First MD Initiated Contact with Patient 08/14/19 1311     (approximate)  I have reviewed the triage vital signs and the nursing notes.  History  Chief Complaint Aphasia    HPI Maurice FootsClaude Moses is a 73 y.o. male with hx of cerebellar stroke, SDH 2/2 fall, HTN who presents for aphasia.  Wife states symptoms first started at 6 AM today, she noticed them when he came back from using the restroom.  Last seen normal between 12 and 1 AM.  She reports for several weeks he has had some issues expressing himself in terms of being slow to find the words he wants, but he is always able to say what he wants.  At 6 AM she noticed a definitive difference, with clear word finding difficulties and expressive aphasia.  No slurred speech, no facial droop, no unilateral or lateralizing weakness or numbness.  On arrival to the emergency department, it appears he has some receptive aphasia as well.  He is intermittently able to follow commands.  It appears that he understands what the commands are but is not able to execute them.  Patient is on Plavix, and daily baby aspirin, no other anticoagulation or other antiplatelets.   Past Medical Hx Past Medical History:  Diagnosis Date  . Cerebellar stroke (HCC)   . Depression   . Hypertension   . IBS (irritable bowel syndrome)   . Stroke (cerebrum) (HCC)   . Stroke (HCC)   . Subdural hemorrhage [facility](HCC)     Problem List Patient Active Problem List   Diagnosis Date Noted  . CVA (cerebral vascular accident) (HCC) 11/26/2016  . 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 TIA (transient ischemic attack) 07/23/2015    Past Surgical Hx Past Surgical History:  Procedure Laterality Date  . LOOP RECORDER INSERTION      Medications Prior to Admission medications   Medication Sig Start Date End Date Taking? Authorizing Provider  aspirin EC 81 MG  EC tablet Take 1 tablet (81 mg total) by mouth daily. 07/26/15   Auburn BilberryPatel, Shreyang, MD  atorvastatin (LIPITOR) 40 MG tablet Take 1 tablet (40 mg total) by mouth daily at 6 PM. 07/26/15   Auburn BilberryPatel, Shreyang, MD  cetirizine (ZYRTEC) 10 MG tablet Take 10 mg by mouth daily.    [provider]  cholecalciferol (VITAMIN D) 1000 units tablet Take 2,000 Units by mouth daily.    [provider]  clopidogrel (PLAVIX) 75 MG tablet Take 1 tablet (75 mg total) by mouth daily. 07/26/15   Auburn BilberryPatel, Shreyang, MD  clorazepate (TRANXENE) 7.5 MG tablet Take 1 tablet by mouth daily as needed for anxiety.     [provider]  docusate sodium (COLACE) 100 MG capsule Take 100 mg by mouth 3 (three) times daily.     [provider]  escitalopram (LEXAPRO) 20 MG tablet Take 20 mg by mouth daily.    [provider]  esomeprazole (NEXIUM) 40 MG capsule Take 40 mg by mouth daily at 12 noon.    [provider]  hydrochlorothiazide (HYDRODIURIL) 25 MG tablet Take 25 mg by mouth daily.    [provider]  HYDROcodone-acetaminophen (NORCO/VICODIN) 5-325 MG tablet Take 1-2 tablets by mouth every 4 (four) hours as needed for moderate pain (Max 7 per day). 07/26/15   Auburn BilberryPatel, Shreyang, MD  mirtazapine (REMERON SOL-TAB) 30 MG disintegrating tablet Take 1 tablet by mouth daily.    [provider]  Omega-3 Fatty Acids (FISH OIL) 1000 MG CAPS Take 1,000 mg by mouth every morning.    [provider]  quinapril (ACCUPRIL) 40 MG tablet Take 40 mg by mouth at bedtime.    [provider]    Allergies Doxycycline and Morphine and related  Family Hx Family History  Problem Relation Age of Onset  . Hypertension Other     Social Hx Social History   Tobacco Use  . Smoking status: Former Games developer  . Smokeless tobacco: Never Used  Substance Use Topics  . Alcohol use: Yes    Comment: occ  . Drug use: No     Review of Systems  Constitutional: Negative for  fever, chills. Eyes: Negative for visual changes. ENT: Negative for sore throat. Cardiovascular: Negative for chest pain. Respiratory: Negative for shortness of breath. Gastrointestinal: Negative for nausea, vomiting.  Genitourinary: Negative for dysuria. Musculoskeletal: Negative for leg swelling. Skin: Negative for rash. Neurological: Negative for for headaches. + aphasia   Physical Exam  Vital Signs: ED Triage Vitals  Enc Vitals Group     BP 08/14/19 1359 (!) 176/96     Pulse Rate 08/14/19 1359 67     Resp 08/14/19 1359 13     Temp 08/14/19 1359 98.9 F (37.2 C)     Temp src --      SpO2 08/14/19 1359 94 %     Weight 08/14/19 1311 234 lb 12.6 oz (106.5 kg)     Height --      Head Circumference --      Peak Flow --      Pain Score 08/14/19 1310 0     Pain Loc --      Pain Edu? --      Excl. in GC? --      Constitutional: Alert and oriented.  Head: Normocephalic. Atraumatic. Eyes: Conjunctivae clear. Sclera anicteric. Nose: No congestion. No rhinorrhea. Mouth/Throat: Wearing mask.  Neck: No stridor.   Cardiovascular: Normal rate, regular rhythm. Extremities well perfused. Respiratory: Normal respiratory effort.  Lungs CTAB. Gastrointestinal: Soft. Non-tender. Non-distended.  Musculoskeletal: No lower extremity edema. No deformities. Neurologic:  Able to state name. Able to identify year correctly with "yes/no" responses and options provided. Can follow intermittent commands (squeeze fingers, raise eyebrows). Cannot follow "thumbs up" command, but seems to understand what is asked of him. Difficulty naming objects. Equal strength to BUE and BLE. Sensation seemingly in tact. Unable to test finger to nose or heel to shin.  Skin: Skin is warm, dry and intact. No rash noted. Psychiatric: Mood and affect are appropriate for situation.  EKG  Personally reviewed.  Obtained 08/14/2019 at 1351.  Rate: 72 Rhythm: sinus Axis: LAD Intervals: prolonged PR No acute ischemic  changes No STEMI    Radiology  CTA head and neck: IMPRESSION:  1. 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  2. Stable moderate stenosis of the supraclinoid right ICA.  3. Stable moderate tandem stenoses of the proximal left posterior  cerebral artery.  4. No other significant proximal stenosis, aneurysm, or branch  vessel occlusion within the Circle of Willis.  5. Stable moderate atrophy and white matter disease.  6. Remote infarct of the left cerebellar peduncle.    Procedures  Procedure(s) performed (including critical care):  Procedures   Initial Impression / Assessment and Plan / ED Course  73 y.o. male who presents to the ED for aphasia, as above.  First noticed at 6 AM by  wife.  Last seen normal between 26 and 1 AM.  LKN 12-1 AM therefore patient not a candidate for tPA.   Ddx: CVA, TIA, cerebral ischemia, electrolyte abnormality, infection. Will obtain labs, imaging. Given his profound aphasia, will obtain CTA studies to evaluate for potential large vessel occlusion that would be amenable to thrombectomy.  CTA with stable vessel stenosis compared to prior. No significant proximal stenosis, aneurysm, or branch vessel occlusion or acute infarct. Remainder of labs w/o actionable derangements. Awaiting urine. Will plan to admit for further TIA/CVA work-up.  Discussed results and plan of care with Neurology, who agrees with the plan.  Discussed with hospitalist for admission.   Final Clinical Impression(s) / ED Diagnosis  Final diagnoses:  Aphasia       Note:  This document was prepared using Dragon voice recognition software and may include unintentional dictation errors.   Lilia Pro., MD 08/14/19 [phone]

## 2019-08-14 NOTE — ED Triage Notes (Signed)
Presents with family   Wife states he was having aphasia since 65am  Hx of CVA 5 yrs ago

## 2019-08-14 NOTE — ED Notes (Signed)
Spouse updated by charge nurse on pt's admission status.

## 2019-08-14 NOTE — ED Notes (Signed)
Patient transported to MRI 

## 2019-08-15 DIAGNOSIS — R4701 Aphasia: Secondary | ICD-10-CM | POA: Diagnosis not present

## 2019-08-15 DIAGNOSIS — I63532 Cerebral infarction due to unspecified occlusion or stenosis of left posterior cerebral artery: Secondary | ICD-10-CM

## 2019-08-15 LAB — COMPREHENSIVE METABOLIC PANEL
ALT: 33 U/L (ref 0–44)
AST: 23 U/L (ref 15–41)
Albumin: 3.9 g/dL (ref 3.5–5.0)
Alkaline Phosphatase: 99 U/L (ref 38–126)
Anion gap: 11 (ref 5–15)
BUN: 9 mg/dL (ref 8–23)
CO2: 27 mmol/L (ref 22–32)
Calcium: 9.5 mg/dL (ref 8.9–10.3)
Chloride: 98 mmol/L (ref 98–111)
Creatinine, Ser: 1 mg/dL (ref 0.61–1.24)
GFR calc Af Amer: >60 mL/min (ref >60)
GFR calc non Af Amer: >60 mL/min (ref >60)
Glucose, Bld: 98 mg/dL (ref 70–99)
Potassium: 3.6 mmol/L (ref 3.5–5.1)
Sodium: 136 mmol/L (ref 135–145)
Total Bilirubin: 0.9 mg/dL (ref 0.3–1.2)
Total Protein: 6.6 g/dL (ref 6.5–8.1)

## 2019-08-15 LAB — SARS CORONAVIRUS 2 (TAT 6-24 HRS): SARS Coronavirus 2: NEGATIVE

## 2019-08-15 LAB — LIPID PANEL
Cholesterol: 153 mg/dL (ref 0–200)
HDL: 48 mg/dL (ref >40)
LDL Cholesterol: 69 mg/dL (ref 0–99)
Total CHOL/HDL Ratio: 3.2 RATIO
Triglycerides: 180 mg/dL — ABNORMAL HIGH (ref <150)
VLDL: 36 mg/dL (ref 0–40)

## 2019-08-15 NOTE — Evaluation (Addendum)
Occupational Therapy Evaluation Patient Details Name: Maurice Moses MRN: [id number] DOB: 11-08-1945 Today's Date: 08/15/2019    History of Present Illness Pt. is a 73 y.o. male who was admitted to [facility] with Aphasia. Imaging revealed a 82mm acute nonhemorrhagic stroke in the left occiptal region. PMHx includes: cerebellar stroke, HTN, and depression.   Clinical Impression   Pt. presents with weakness, limited activity tolerance, limited cognition with delayed processing, and limited functional mobility which hinders is ability to complete basic ADL and IADL functioning. Pt. resides at home with his wife. Pt. was independent with ADLs, Pt. wife assists with meal preparation, and medication management. Pt. was able to drive minimally. Pt. Was assisted with transfers from the bed to the chair. Pt. Presented with limited unsupported sitting balance with positive posterior leaning. Pt. was able to right self to midline with cuing, and increased time for processing. Pt. Requires increased cues for hand placement when pushing up to standing from the bed. Pt. Could benefit from OT services for ADL training, A/E training, neuromuscular re-ed, balance, and pt. Pt./craegiver Education about home modification, and DME. Pt. Plans to return home upon discharge with family to assist pt. as needed. No follow OT services upon return home per wife request as pt.'s wife reports they have a system down at home that is working for them. Pt.'s wife is interested in resources for private sitters at home when she is out.    Follow Up Recommendations  No OT follow up    Equipment Recommendations       Recommendations for Other Services       Precautions / Restrictions Precautions Precautions: Fall Restrictions Weight Bearing Restrictions: No      Mobility Bed Mobility Overal bed mobility: Needs Assistance Bed Mobility: Supine to Sit     Supine to sit: Min guard     General bed mobility comments: Increased  time  Transfers Overall transfer level: Needs assistance Equipment used: None Transfers: Sit to/from Stand Sit to Stand: Min guard         General transfer comment: Required multiple attempts from low surface but was able to stand from elevated bedside without assistance.    Balance Overall balance assessment: Needs assistance Sitting-balance support: Feet supported Sitting balance-Leahy Scale: Fair(Posterior leaning when sitting unsupported.)     Standing balance support: Single extremity supported Standing balance-Leahy Scale: Fair                      ADL either performed or assessed with clinical judgement   ADL Overall ADL's : Needs assistance/impaired Eating/Feeding: Independent;Set up   Grooming: Independent;Set up   Upper Body Bathing: Independent;Set up   Lower Body Bathing: Set up;Min guard   Upper Body Dressing : Min guard   Lower Body Dressing: Minimal assistance;Min guard               Functional mobility during ADLs: Min guard       Vision Baseline Vision/History: No visual deficits Patient Visual Report: No change from baseline       Perception     Praxis      Pertinent Vitals/Pain Pain Assessment: Faces Faces Pain Scale: Hurts a little bit Pain Location: left shoulder- reports a 10 year hisroy ofr shoulder issues Pain Descriptors / Indicators: (unable to describe)     Hand Dominance Right   Extremity/Trunk Assessment Upper Extremity Assessment Upper Extremity Assessment: Overall WFL for tasks assessed     Cervical / Trunk Assessment Cervical /  Trunk Assessment: Normal   Communication Communication Communication: Expressive difficulties(Increased time for processing)   Cognition Arousal/Alertness: Awake/alert Behavior During Therapy: WFL for tasks assessed/performed Overall Cognitive Status: No family/caregiver present to determine baseline cognitive functioning                                  General Comments: Wife reports a history of delayed processing   General Comments       Exercises   Shoulder Instructions     Home Living Family/patient expects to be discharged to:: Private residence Living Arrangements: Spouse/significant other Available Help at Discharge: Family;Available 24 hours/day Type of Home: House Home Access: Stairs to enter [employer] of Steps: 5   Home Layout: Two level;Able to live on main level with bedroom/bathroom     Bathroom Shower/Tub: Walk-in shower;Door   [employer] Toilet: Standard Bathroom Accessibility: Yes   Home Equipment: Environmental consultant - 2 wheels;Cane - single point;Wheelchair - manual   Additional Comments: Pt stated that he has grab bars at toilet, "which they don't let you have here".  Reports no need for AD with household ambulation.  Pt a questionable historian and resistant to some questions.  He did state that he has a walk in shower and previous documentation reports a tub/shower unit.      Prior Functioning/Environment Level of Independence: Independent        Comments: Pt. was independent with ADLs, wife assists as needed. Was not using assistive devices, but has a walker, and cane.        OT Problem List: Decreased strength;Pain;Impaired UE functional use;Decreased knowledge of use of DME or AE;Impaired balance (sitting and/or standing)      OT Treatment/Interventions: Self-care/ADL training;Therapeutic exercise;Patient/family education;DME and/or AE instruction;Therapeutic activities    OT Goals(Current goals can be found in the care plan section) Acute Rehab OT Goals Patient Stated Goal: To return home OT Goal Formulation: With patient Potential to Achieve Goals: Good  OT Frequency: Min 2X/week   Barriers to D/C:            Co-evaluation              AM-PAC OT "6 Clicks" Daily Activity     Outcome Measure Help from another person eating meals?: None Help from another person taking care of  personal grooming?: A Little Help from another person toileting, which includes using toliet, bedpan, or urinal?: A Little Help from another person bathing (including washing, rinsing, drying)?: A Little Help from another person to put on and taking off regular upper body clothing?: A Little Help from another person to put on and taking off regular lower body clothing?: A Little 6 Click Score: 19   End of Session    Activity Tolerance: Patient tolerated treatment well Patient left: with call bell/phone within reach;with chair alarm set;with family/visitor present  OT Visit Diagnosis: Unsteadiness on feet (R26.81)                Time: 1100-1140 OT Time Calculation (min): 35 min. Charges:  OT General Charges $OT Visit: 1 Visit OT Evaluation $OT Eval Moderate Complexity: 1 Mod  Harrel Carina, MS, OTR/L  Harrel Carina 08/15/2019, 12:31 PM

## 2019-08-15 NOTE — Discharge Summary (Addendum)
Physician Discharge Summary  Maurice FootsClaude Schweiss UJW:[id number] DOB: 03/21/1946 DOA: 08/14/2019  PCP: Josephina GipLinfors, Eugene W, MD  Admit date: 08/14/2019 Discharge date: 08/15/2019  Discharge disposition: Home with home health PT  Recommendations for Outpatient Follow-Up:    Outpatient follow-up with PCP and neurologist recommended.  Discharge Diagnosis:   Principal Problem:   Cerebral infarction [facility](HCC) Active Problems:   Aphasia    Discharge Condition: Stable.  Diet recommendation: Low-salt diet  Code status: DNR.    Hospital Course:    Maurice Moses is an 73 y.o. male with history of cerebellar stroke, depression, hypertension, IBS, subdural hemorrhage, who presented to the hospital today because of speech problems.  According to his wife, they went to bed between 12 and 1 AM this morning and at that time he was fine.  They woke up around 6 AM this morning and his wife noticed that the patient could not get his words out and speech was gibberish.  Patient was confused.  This persisted so patient was brought to the emergency room for further evaluation.  According to his wife, since his last stroke, patient has had memory problems and has had trouble with numbers and time.  He has also had difficulty with balance and also he "furniture walks".  He does not use any assistive device at home and he manages well.  MRI of the brain showed new nonhemorrhagic infarct in the left occipital lobe.  Aphasia has resolved and patient is back to his baseline with normal speech.  He was evaluated by the neurologist, Dr. Loretha BrasilZeylikman, who recommended that patient continue with his aspirin and Plavix which he already takes at home.  He was also evaluated by PT and OT who recommended home health PT.  Discharge plan was discussed with the patient and his wife.       Discharge Exam:   Vitals:   08/15/19 0720 08/15/19 1000  BP: (!) 183/86 (!) 163/94  Pulse: 63 84  Resp:  18  Temp: 98.8 F (37.1  C)   SpO2: 97% 95%   Vitals:   08/15/19 0327 08/15/19 0543 08/15/19 0720 08/15/19 1000  BP: (!) 187/91 (!) 159/78 (!) 183/86 (!) 163/94  Pulse: 66 63 63 84  Resp: (!) 21 17  18   Temp: 98.6 F (37 C) 98.1 F (36.7 C) 98.8 F (37.1 C)   TempSrc: Oral Oral Oral   SpO2: 99% 98% 97% 95%  Weight:         GEN: NAD SKIN: No rash EYES: EOMI ENT: MMM CV: RRR PULM: CTA B ABD: soft, obese, NT, +BS CNS: AAO x 3, non focal EXT: No edema or tenderness   The results of significant diagnostics from this hospitalization (including imaging, microbiology, ancillary and laboratory) are listed below for reference.     Procedures and Diagnostic Studies:   CT Angio Head W/Cm &/Or Wo Cm  Result Date: 08/14/2019 CLINICAL DATA:  Receptive and expressive aphasia. EXAM: CT ANGIOGRAPHY HEAD AND NECK TECHNIQUE: Multidetector CT imaging of the head and neck was performed using the standard protocol during bolus administration of intravenous contrast. Multiplanar CT image reconstructions and MIPs were obtained to evaluate the vascular anatomy. Carotid stenosis measurements (when applicable) are obtained utilizing NASCET criteria, using the distal internal carotid diameter as the denominator. CONTRAST:  75mL OMNIPAQUE IOHEXOL 350 MG/ML SOLN COMPARISON:  CT head without contrast and CTA head 12/15/2017 FINDINGS: CT HEAD FINDINGS Brain: Moderate atrophy and white matter disease is stable. Acute cortical infarct is present. Basal  ganglia are intact. Insular ribbon is normal. A remote infarct of the left cerebellar peduncle is stable. The ventricles are proportionate to the degree of atrophy. No significant extraaxial fluid collection is present. Vascular: Atherosclerotic calcifications are present within the cavernous internal carotid arteries bilaterally. There is no hyperdense vessel. Skull: Calvarium is intact. No focal lytic or blastic lesions are present. No significant extracranial soft tissue lesion is  present. Sinuses: The paranasal sinuses and mastoid air cells are clear. Orbits: The globes and orbits are within normal limits. Review of the MIP images confirms the above findings CTA NECK FINDINGS Aortic arch: A 3 vessel arch configuration is present. No significant atherosclerotic change or stenosis is present at the arch. Right carotid system: The right common carotid artery is within normal limits. Minimal atherosclerotic changes again noted at the bifurcation without significant stenosis. There is moderate tortuosity of the cervical right ICA without significant stenosis. Left carotid system: The left common carotid artery is within normal limits. Mild atherosclerotic changes are noted at the bifurcation without significant stenosis. There is moderate tortuosity of the cervical left ICA without significant stenosis. Vertebral arteries: The left vertebral artery is slightly dominant to the right. Moderate stenosis is again noted at the origin of the right vertebral artery. There is no other significant stenosis of either vertebral artery in the neck. Skeleton: There straightening of normal cervical lordosis. Slight anterolisthesis at C4-5 is stable. Endplate degenerative changes are most evident C5-6 and C6-7. No focal lytic or blastic lesions are present. Other neck: The soft tissues the neck otherwise unremarkable. Upper chest: Mild dependent atelectasis is present. Lungs are otherwise clear. Thoracic inlet is normal. Review of the MIP images confirms the above findings CTA HEAD FINDINGS Anterior circulation: Atherosclerotic calcifications are again noted within the cavernous internal carotid arteries bilaterally. Moderate stenosis of the supraclinoid right ICA is similar the prior study. More distal terminal right ICA is normal. There is no significant stenosis of greater than 50% on the left. The A1 and M1 segments are normal. Anterior communicating artery is patent. MCA bifurcations are intact. The ACA  and MCA branch vessels are normal. Posterior circulation: Left vertebral artery is the dominant vessel. AICA vessels are dominant. Vertebrobasilar junction is normal. Both posterior cerebral arteries originate from the basilar tip. Tandem high-grade stenoses of the proximal left posterior cerebral artery are stable. Distal branch vessels opacify bilaterally. Venous sinuses: The dural sinuses are patent. The straight sinus and deep cerebral veins are intact. Cortical veins are unremarkable. Right transverse sinus is dominant. No vascular malformation is evident. Anatomic variants: None Review of the MIP images confirms the above findings IMPRESSION: 1. 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 2. Stable moderate stenosis of the supraclinoid right ICA. 3. Stable moderate tandem stenoses of the proximal left posterior cerebral artery. 4. No other significant proximal stenosis, aneurysm, or branch vessel occlusion within the Circle of Willis. 5. Stable moderate atrophy and white matter disease. 6. Remote infarct of the left cerebellar peduncle. Electronically Signed   By: Marin Roberts M.D.   On: 08/14/2019 14:12   CT Angio Neck W and/or Wo Contrast  Result Date: 08/14/2019 CLINICAL DATA:  Receptive and expressive aphasia. EXAM: CT ANGIOGRAPHY HEAD AND NECK TECHNIQUE: Multidetector CT imaging of the head and neck was performed using the standard protocol during bolus administration of intravenous contrast. Multiplanar CT image reconstructions and MIPs were obtained to evaluate the vascular anatomy. Carotid stenosis measurements (when applicable) are obtained utilizing  NASCET criteria, using the distal internal carotid diameter as the denominator. CONTRAST:  75mL OMNIPAQUE IOHEXOL 350 MG/ML SOLN COMPARISON:  CT head without contrast and CTA head 12/15/2017 FINDINGS: CT HEAD FINDINGS Brain: Moderate atrophy and white matter disease is stable. Acute cortical infarct is  present. Basal ganglia are intact. Insular ribbon is normal. A remote infarct of the left cerebellar peduncle is stable. The ventricles are proportionate to the degree of atrophy. No significant extraaxial fluid collection is present. Vascular: Atherosclerotic calcifications are present within the cavernous internal carotid arteries bilaterally. There is no hyperdense vessel. Skull: Calvarium is intact. No focal lytic or blastic lesions are present. No significant extracranial soft tissue lesion is present. Sinuses: The paranasal sinuses and mastoid air cells are clear. Orbits: The globes and orbits are within normal limits. Review of the MIP images confirms the above findings CTA NECK FINDINGS Aortic arch: A 3 vessel arch configuration is present. No significant atherosclerotic change or stenosis is present at the arch. Right carotid system: The right common carotid artery is within normal limits. Minimal atherosclerotic changes again noted at the bifurcation without significant stenosis. There is moderate tortuosity of the cervical right ICA without significant stenosis. Left carotid system: The left common carotid artery is within normal limits. Mild atherosclerotic changes are noted at the bifurcation without significant stenosis. There is moderate tortuosity of the cervical left ICA without significant stenosis. Vertebral arteries: The left vertebral artery is slightly dominant to the right. Moderate stenosis is again noted at the origin of the right vertebral artery. There is no other significant stenosis of either vertebral artery in the neck. Skeleton: There straightening of normal cervical lordosis. Slight anterolisthesis at C4-5 is stable. Endplate degenerative changes are most evident C5-6 and C6-7. No focal lytic or blastic lesions are present. Other neck: The soft tissues the neck otherwise unremarkable. Upper chest: Mild dependent atelectasis is present. Lungs are otherwise clear. Thoracic inlet is  normal. Review of the MIP images confirms the above findings CTA HEAD FINDINGS Anterior circulation: Atherosclerotic calcifications are again noted within the cavernous internal carotid arteries bilaterally. Moderate stenosis of the supraclinoid right ICA is similar the prior study. More distal terminal right ICA is normal. There is no significant stenosis of greater than 50% on the left. The A1 and M1 segments are normal. Anterior communicating artery is patent. MCA bifurcations are intact. The ACA and MCA branch vessels are normal. Posterior circulation: Left vertebral artery is the dominant vessel. AICA vessels are dominant. Vertebrobasilar junction is normal. Both posterior cerebral arteries originate from the basilar tip. Tandem high-grade stenoses of the proximal left posterior cerebral artery are stable. Distal branch vessels opacify bilaterally. Venous sinuses: The dural sinuses are patent. The straight sinus and deep cerebral veins are intact. Cortical veins are unremarkable. Right transverse sinus is dominant. No vascular malformation is evident. Anatomic variants: None Review of the MIP images confirms the above findings IMPRESSION: 1. 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 2. Stable moderate stenosis of the supraclinoid right ICA. 3. Stable moderate tandem stenoses of the proximal left posterior cerebral artery. 4. No other significant proximal stenosis, aneurysm, or branch vessel occlusion within the Circle of Willis. 5. Stable moderate atrophy and white matter disease. 6. Remote infarct of the left cerebellar peduncle. Electronically Signed   By: Marin Roberts M.D.   On: 08/14/2019 14:12   MR BRAIN WO CONTRAST  Result Date: 08/14/2019 CLINICAL DATA:  TIA. Personal history of cerebellar infarct. Aphasia.  EXAM: MRI HEAD WITHOUT CONTRAST TECHNIQUE: Multiplanar, multiecho pulse sequences of the brain and surrounding structures were obtained without  intravenous contrast. COMPARISON:  CTA head and neck 08/14/2019. MR head without contrast 11/26/2016 FINDINGS: Brain: A 4 mm subcortical white matter infarct is present in the left occipital lobe. Other acute infarct is present. A remote left cerebral peduncle and superior cerebellar infarct is present. A small remote lacunar infarct is present in the right cerebellum. Moderate generalized atrophy is present. The ventricles are proportionate to the degree of atrophy. No significant extraaxial fluid collection is present. Vascular: Flow is present in the major intracranial arteries. Skull and upper cervical spine: The craniocervical junction is normal. Upper cervical spine is within normal limits. Marrow signal is unremarkable. Sinuses/Orbits: There is scattered ethmoid opacification. Fluid is present in the right mastoid air cells. No obstructing nasopharyngeal lesion is present. The paranasal sinuses are otherwise clear. The globes and orbits are within normal limits. IMPRESSION: 1. 4 mm acute nonhemorrhagic subcortical white matter infarct in the left occipital lobe. 2. Remote left cerebral peduncle and superior cerebellar peduncle infarct. 3. Remote lacunar infarct of the right cerebellum. 4. Moderate generalized atrophy. Electronically Signed   By: Marin Roberts M.D.   On: 08/14/2019 19:07     Labs:   Basic Metabolic Panel: Recent Labs  Lab 08/15/19 0551  NA 136  K 3.6  CL 98  CO2 27  GLUCOSE 98  BUN 9  CREATININE 1.00  CALCIUM 9.5   GFR CrCl cannot be calculated (Unknown ideal weight.). Liver Function Tests: Recent Labs  Lab 08/15/19 0551  AST 23  ALT 33  ALKPHOS 99  BILITOT 0.9  PROT 6.6  ALBUMIN 3.9   No results for input(s): LIPASE, AMYLASE in the last 168 hours. No results for input(s): AMMONIA in the last 168 hours. Coagulation profile Recent Labs  Lab 08/14/19 1318  INR 1.0    CBC: Recent Labs  Lab 08/14/19 1318  WBC 12.7*  NEUTROABS 10.0*  HGB 15.8    HCT 44.4  MCV 85.7  PLT 294   Cardiac Enzymes: No results for input(s): CKTOTAL, CKMB, CKMBINDEX, TROPONINI in the last 168 hours. BNP: Invalid input(s): POCBNP CBG: Recent Labs  Lab 08/14/19 1318  GLUCAP 108*   D-Dimer No results for input(s): DDIMER in the last 72 hours. Hgb A1c No results for input(s): HGBA1C in the last 72 hours. Lipid Profile Recent Labs    08/15/19 0551  CHOL 153  HDL 48  LDLCALC 69  TRIG 180*  CHOLHDL 3.2   Thyroid function studies No results for input(s): TSH, T4TOTAL, T3FREE, THYROIDAB in the last 72 hours.  Invalid input(s): FREET3 Anemia work up No results for input(s): VITAMINB12, FOLATE, FERRITIN, TIBC, IRON, RETICCTPCT in the last 72 hours. Microbiology Recent Results (from the past 240 hour(s))  SARS CORONAVIRUS 2 (TAT 6-24 HRS) Nasopharyngeal Nasopharyngeal Swab     Status: None   Collection Time: 08/14/19  4:18 PM   Specimen: Nasopharyngeal Swab  Result Value Ref Range Status   SARS Coronavirus 2 NEGATIVE NEGATIVE Final    Comment: (NOTE) SARS-CoV-2 target nucleic acids are NOT DETECTED. The SARS-CoV-2 RNA is generally detectable in upper and lower respiratory specimens during the acute phase of infection. Negative results do not preclude SARS-CoV-2 infection, do not rule out co-infections with other pathogens, and should not be used as the sole basis for treatment or other patient management decisions. Negative results must be combined with clinical observations, patient history, and epidemiological information.  The expected result is Negative. Fact Sheet for Patients: HairSlick.no Fact Sheet for Healthcare Providers: quierodirigir.com This test is not yet approved or cleared by the [place] FDA and  has been authorized for detection and/or diagnosis of SARS-CoV-2 by FDA under an Emergency Use Authorization (EUA). This EUA will remain  in effect (meaning this test  can be used) for the duration of the COVID-19 declaration under Section 56 4(b)(1) of the Act, 21 U.S.C. section 360bbb-3(b)(1), unless the authorization is terminated or revoked sooner. Performed at [facility], [address], [place], [place] [address]      Discharge Instructions:   Discharge Instructions    Diet - low sodium heart healthy   Complete by: As directed    Face-to-face encounter (required for Medicare/Medicaid patients)   Complete by: As directed    I Hazell Siwik certify that this patient is under my care and that I, or a nurse practitioner or physician's assistant working with me, had a face-to-face encounter that meets the physician face-to-face encounter requirements with this patient on 08/15/2019. The encounter with the patient was in whole, or in part for the following medical condition(s) which is the primary reason for home health care (List medical condition): stroke   The encounter with the patient was in whole, or in part, for the following medical condition, which is the primary reason for home health care: stroke   I certify that, based on my findings, the following services are medically necessary home health services: Physical therapy   Reason for Medically Necessary Home Health Services: Therapy- Investment banker, operational, Patent examiner   My clinical findings support the need for the above services: Unable to leave home safely without assistance and/or assistive device   Further, I certify that my clinical findings support that this patient is homebound due to: Unable to leave home safely without assistance   Home Health   Complete by: As directed    To provide the following care/treatments: PT   Increase activity slowly   Complete by: As directed      Allergies as of 08/15/2019      Reactions   Doxycycline Nausea And Vomiting   Morphine And Related Other (See Comments)   Agitation - wife does not want him to have it        Medication List    TAKE these medications   aspirin 81 MG EC tablet Take 1 tablet (81 mg total) by mouth daily.   atorvastatin 40 MG tablet Commonly known as: LIPITOR Take 1 tablet (40 mg total) by mouth daily at 6 PM.   cetirizine 10 MG tablet Commonly known as: ZYRTEC Take 10 mg by mouth daily.   cholecalciferol 1000 units tablet Commonly known as: VITAMIN D Take 2,000 Units by mouth daily.   clopidogrel 75 MG tablet Commonly known as: PLAVIX Take 1 tablet (75 mg total) by mouth daily.   clorazepate 7.5 MG tablet Commonly known as: TRANXENE Take 1 tablet by mouth daily as needed for anxiety.   docusate sodium 100 MG capsule Commonly known as: COLACE Take 300 mg by mouth daily.   escitalopram 20 MG tablet Commonly known as: LEXAPRO Take 20 mg by mouth daily.   esomeprazole 40 MG capsule Commonly known as: NEXIUM Take 40 mg by mouth daily at 12 noon.   Fish Oil 1000 MG Caps Take 1,000 mg by mouth every morning.   hydrochlorothiazide 25 MG tablet Commonly known as: HYDRODIURIL Take 25 mg  by mouth daily.   HYDROcodone-acetaminophen 5-325 MG tablet Commonly known as: NORCO/VICODIN Take 1-2 tablets by mouth every 4 (four) hours as needed for moderate pain (Max 7 per day).   mirtazapine 30 MG disintegrating tablet Commonly known as: REMERON SOL-TAB Take 1 tablet by mouth daily.   quinapril 40 MG tablet Commonly known as: ACCUPRIL Take 40 mg by mouth at bedtime.         Time coordinating discharge: 26 minutes  Signed:  Verdis Bassette  [facility] 08/15/2019, 2:10 PM

## 2019-08-15 NOTE — Care Management Obs Status (Signed)
[place] NOTIFICATION   Patient Details  Name: Maurice Moses MRN: [id number] Date of Birth: Feb 09, 1946   Medicare Observation Status Notification Given:  Yes    Marshell Garfinkel, RN 08/15/2019, 2:11 PM

## 2019-08-15 NOTE — Consult Note (Signed)
Reason for Consult: dysarthria  Referring Physician: Dr. Myriam Forehand   CC: Dysarthria   HPI: Maurice Moses is an 73 y.o. male history of cerebellar stroke in [place], depression, hypertension, IBS, subdural hemorrhage, who presented to the hospital today because of speech problems.  According to his wife woke up around 6 AM this morning and his wife noticed that the patient could not get his words out and speech was gibberish.  Patient was confused.  This persisted so patient was brought to the emergency room for further evaluation. It appears patient has been having progressive memory problems now improved and appears to be back to baseline.  Past Medical History:  Diagnosis Date  . Cerebellar stroke (HCC)   . Depression   . Hypertension   . IBS (irritable bowel syndrome)   . Stroke (cerebrum) (HCC)   . Stroke (HCC)   . Subdural hemorrhage [facility])     Past Surgical History:  Procedure Laterality Date  . LOOP RECORDER INSERTION      Family History  Problem Relation Age of Onset  . Hypertension Other     Social History:  reports that he has quit smoking. He has never used smokeless tobacco. He reports current alcohol use. He reports that he does not use drugs.  Allergies  Allergen Reactions  . Doxycycline Nausea And Vomiting  . Morphine And Related Other (See Comments)    Agitation - wife does not want him to have it    Medications: I have reviewed the patient's current medications.  ROS: Unable to obtain due to confusion   Physical Examination: Blood pressure (!) 163/94, pulse 84, temperature 98.8 F (37.1 C), temperature source Oral, resp. rate 18, weight 108.7 kg, SpO2 95 %.   Neurological Examination   Mental Status: Alert to name  Cranial Nerves: II: Discs flat bilaterally; Visual fields grossly normal, pupils equal, round, reactive to light and accommodation III,IV, VI: ptosis not present, extra-ocular motions intact bilaterally V,VII: smile symmetric, facial light  touch sensation normal bilaterally VIII: hearing normal bilaterally XI: bilateral shoulder shrug XII: midline tongue extension Motor: Right : Upper extremity   5/5    Left:     Upper extremity   5/5  Lower extremity   5/5     Lower extremity   5/5 Tone and bulk:normal tone throughout; no atrophy noted Sensory: Pinprick and light touch intact throughout, bilaterally Deep Tendon Reflexes: 1+ and symmetric throughout Plantars: Right: downgoing   Left: downgoing Cerebellar: Not tested      Laboratory Studies:   Basic Metabolic Panel: Recent Labs  Lab 08/15/19 0551  NA 136  K 3.6  CL 98  CO2 27  GLUCOSE 98  BUN 9  CREATININE 1.00  CALCIUM 9.5    Liver Function Tests: Recent Labs  Lab 08/15/19 0551  AST 23  ALT 33  ALKPHOS 99  BILITOT 0.9  PROT 6.6  ALBUMIN 3.9   No results for input(s): LIPASE, AMYLASE in the last 168 hours. No results for input(s): AMMONIA in the last 168 hours.  CBC: Recent Labs  Lab 08/14/19 1318  WBC 12.7*  NEUTROABS 10.0*  HGB 15.8  HCT 44.4  MCV 85.7  PLT 294    Cardiac Enzymes: No results for input(s): CKTOTAL, CKMB, CKMBINDEX, TROPONINI in the last 168 hours.  BNP: Invalid input(s): POCBNP  CBG: Recent Labs  Lab 08/14/19 1318  GLUCAP 108*    Microbiology: Results for orders placed or performed during the hospital encounter of 08/14/19  SARS CORONAVIRUS  2 (TAT 6-24 HRS) Nasopharyngeal Nasopharyngeal Swab     Status: None   Collection Time: 08/14/19  4:18 PM   Specimen: Nasopharyngeal Swab  Result Value Ref Range Status   SARS Coronavirus 2 NEGATIVE NEGATIVE Final    Comment: (NOTE) SARS-CoV-2 target nucleic acids are NOT DETECTED. The SARS-CoV-2 RNA is generally detectable in upper and lower respiratory specimens during the acute phase of infection. Negative results do not preclude SARS-CoV-2 infection, do not rule out co-infections with other pathogens, and should not be used as the sole basis for treatment or  other patient management decisions. Negative results must be combined with clinical observations, patient history, and epidemiological information. The expected result is Negative. Fact Sheet for Patients: HairSlick.nohttps://www.fda.gov/media/138098/download Fact Sheet for Healthcare Providers: quierodirigir.comhttps://www.fda.gov/media/138095/download This test is not yet approved or cleared by the [place] FDA and  has been authorized for detection and/or diagnosis of SARS-CoV-2 by FDA under an Emergency Use Authorization (EUA). This EUA will remain  in effect (meaning this test can be used) for the duration of the COVID-19 declaration under Section 56 4(b)(1) of the Act, 21 U.S.C. section 360bbb-3(b)(1), unless the authorization is terminated or revoked sooner. Performed at [facility], [address] [place], [place] [address]     Coagulation Studies: Recent Labs    08/14/19 1318  LABPROT 12.8  INR 1.0    Urinalysis: No results for input(s): COLORURINE, LABSPEC, PHURINE, GLUCOSEU, HGBUR, BILIRUBINUR, KETONESUR, PROTEINUR, UROBILINOGEN, NITRITE, LEUKOCYTESUR in the last 168 hours.  Invalid input(s): APPERANCEUR  Lipid Panel:     Component Value Date/Time   CHOL 153 08/15/2019 0551   TRIG 180 (H) 08/15/2019 0551   HDL 48 08/15/2019 0551   CHOLHDL 3.2 08/15/2019 0551   VLDL 36 08/15/2019 0551   LDLCALC 69 08/15/2019 0551    HgbA1C:  Lab Results  Component Value Date   HGBA1C 5.4 11/26/2016    Urine Drug Screen:      Component Value Date/Time   LABOPIA POSITIVE (A) 12/15/2017 0229   COCAINSCRNUR NONE DETECTED 12/15/2017 0229   LABBENZ NONE DETECTED 12/15/2017 0229   AMPHETMU NONE DETECTED 12/15/2017 0229   THCU NONE DETECTED 12/15/2017 0229   LABBARB NONE DETECTED 12/15/2017 0229    Alcohol Level: No results for input(s): ETH in the last 168 hours.  Other results: EKG: normal EKG, normal sinus rhythm, unchanged from previous tracings.  Imaging: CT Angio Head W/Cm  &/Or Wo Cm  Result Date: 08/14/2019 CLINICAL DATA:  Receptive and expressive aphasia. EXAM: CT ANGIOGRAPHY HEAD AND NECK TECHNIQUE: Multidetector CT imaging of the head and neck was performed using the standard protocol during bolus administration of intravenous contrast. Multiplanar CT image reconstructions and MIPs were obtained to evaluate the vascular anatomy. Carotid stenosis measurements (when applicable) are obtained utilizing NASCET criteria, using the distal internal carotid diameter as the denominator. CONTRAST:  75mL OMNIPAQUE IOHEXOL 350 MG/ML SOLN COMPARISON:  CT head without contrast and CTA head 12/15/2017 FINDINGS: CT HEAD FINDINGS Brain: Moderate atrophy and white matter disease is stable. Acute cortical infarct is present. Basal ganglia are intact. Insular ribbon is normal. A remote infarct of the left cerebellar peduncle is stable. The ventricles are proportionate to the degree of atrophy. No significant extraaxial fluid collection is present. Vascular: Atherosclerotic calcifications are present within the cavernous internal carotid arteries bilaterally. There is no hyperdense vessel. Skull: Calvarium is intact. No focal lytic or blastic lesions are present. No significant extracranial soft tissue lesion is present. Sinuses: The paranasal sinuses  and mastoid air cells are clear. Orbits: The globes and orbits are within normal limits. Review of the MIP images confirms the above findings CTA NECK FINDINGS Aortic arch: A 3 vessel arch configuration is present. No significant atherosclerotic change or stenosis is present at the arch. Right carotid system: The right common carotid artery is within normal limits. Minimal atherosclerotic changes again noted at the bifurcation without significant stenosis. There is moderate tortuosity of the cervical right ICA without significant stenosis. Left carotid system: The left common carotid artery is within normal limits. Mild atherosclerotic changes are  noted at the bifurcation without significant stenosis. There is moderate tortuosity of the cervical left ICA without significant stenosis. Vertebral arteries: The left vertebral artery is slightly dominant to the right. Moderate stenosis is again noted at the origin of the right vertebral artery. There is no other significant stenosis of either vertebral artery in the neck. Skeleton: There straightening of normal cervical lordosis. Slight anterolisthesis at C4-5 is stable. Endplate degenerative changes are most evident C5-6 and C6-7. No focal lytic or blastic lesions are present. Other neck: The soft tissues the neck otherwise unremarkable. Upper chest: Mild dependent atelectasis is present. Lungs are otherwise clear. Thoracic inlet is normal. Review of the MIP images confirms the above findings CTA HEAD FINDINGS Anterior circulation: Atherosclerotic calcifications are again noted within the cavernous internal carotid arteries bilaterally. Moderate stenosis of the supraclinoid right ICA is similar the prior study. More distal terminal right ICA is normal. There is no significant stenosis of greater than 50% on the left. The A1 and M1 segments are normal. Anterior communicating artery is patent. MCA bifurcations are intact. The ACA and MCA branch vessels are normal. Posterior circulation: Left vertebral artery is the dominant vessel. AICA vessels are dominant. Vertebrobasilar junction is normal. Both posterior cerebral arteries originate from the basilar tip. Tandem high-grade stenoses of the proximal left posterior cerebral artery are stable. Distal branch vessels opacify bilaterally. Venous sinuses: The dural sinuses are patent. The straight sinus and deep cerebral veins are intact. Cortical veins are unremarkable. Right transverse sinus is dominant. No vascular malformation is evident. Anatomic variants: None Review of the MIP images confirms the above findings IMPRESSION: 1. Stable moderate stenosis at the origin  of the dominant right vertebral artery. 2. Stable moderate stenosis of the supraclinoid right ICA. 3. Stable moderate tandem stenoses of the proximal left posterior cerebral artery. 4. No other significant proximal stenosis, aneurysm, or branch vessel occlusion within the Circle of Willis. 5. Stable moderate atrophy and white matter disease. 6. Remote infarct of the left cerebellar peduncle. Electronically Signed   By: Marin Roberts M.D.   On: 08/14/2019 14:12   CT Angio Neck W and/or Wo Contrast  Result Date: 08/14/2019 CLINICAL DATA:  Receptive and expressive aphasia. EXAM: CT ANGIOGRAPHY HEAD AND NECK TECHNIQUE: Multidetector CT imaging of the head and neck was performed using the standard protocol during bolus administration of intravenous contrast. Multiplanar CT image reconstructions and MIPs were obtained to evaluate the vascular anatomy. Carotid stenosis measurements (when applicable) are obtained utilizing NASCET criteria, using the distal internal carotid diameter as the denominator. CONTRAST:  65mL OMNIPAQUE IOHEXOL 350 MG/ML SOLN COMPARISON:  CT head without contrast and CTA head 12/15/2017 FINDINGS: CT HEAD FINDINGS Brain: Moderate atrophy and white matter disease is stable. Acute cortical infarct is present. Basal ganglia are intact. Insular ribbon is normal. A remote infarct of the left cerebellar peduncle is stable. The ventricles are proportionate to the degree of atrophy.  No significant extraaxial fluid collection is present. Vascular: Atherosclerotic calcifications are present within the cavernous internal carotid arteries bilaterally. There is no hyperdense vessel. Skull: Calvarium is intact. No focal lytic or blastic lesions are present. No significant extracranial soft tissue lesion is present. Sinuses: The paranasal sinuses and mastoid air cells are clear. Orbits: The globes and orbits are within normal limits. Review of the MIP images confirms the above findings CTA NECK FINDINGS  Aortic arch: A 3 vessel arch configuration is present. No significant atherosclerotic change or stenosis is present at the arch. Right carotid system: The right common carotid artery is within normal limits. Minimal atherosclerotic changes again noted at the bifurcation without significant stenosis. There is moderate tortuosity of the cervical right ICA without significant stenosis. Left carotid system: The left common carotid artery is within normal limits. Mild atherosclerotic changes are noted at the bifurcation without significant stenosis. There is moderate tortuosity of the cervical left ICA without significant stenosis. Vertebral arteries: The left vertebral artery is slightly dominant to the right. Moderate stenosis is again noted at the origin of the right vertebral artery. There is no other significant stenosis of either vertebral artery in the neck. Skeleton: There straightening of normal cervical lordosis. Slight anterolisthesis at C4-5 is stable. Endplate degenerative changes are most evident C5-6 and C6-7. No focal lytic or blastic lesions are present. Other neck: The soft tissues the neck otherwise unremarkable. Upper chest: Mild dependent atelectasis is present. Lungs are otherwise clear. Thoracic inlet is normal. Review of the MIP images confirms the above findings CTA HEAD FINDINGS Anterior circulation: Atherosclerotic calcifications are again noted within the cavernous internal carotid arteries bilaterally. Moderate stenosis of the supraclinoid right ICA is similar the prior study. More distal terminal right ICA is normal. There is no significant stenosis of greater than 50% on the left. The A1 and M1 segments are normal. Anterior communicating artery is patent. MCA bifurcations are intact. The ACA and MCA branch vessels are normal. Posterior circulation: Left vertebral artery is the dominant vessel. AICA vessels are dominant. Vertebrobasilar junction is normal. Both posterior cerebral arteries  originate from the basilar tip. Tandem high-grade stenoses of the proximal left posterior cerebral artery are stable. Distal branch vessels opacify bilaterally. Venous sinuses: The dural sinuses are patent. The straight sinus and deep cerebral veins are intact. Cortical veins are unremarkable. Right transverse sinus is dominant. No vascular malformation is evident. Anatomic variants: None Review of the MIP images confirms the above findings IMPRESSION: 1. 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 2. Stable moderate stenosis of the supraclinoid right ICA. 3. Stable moderate tandem stenoses of the proximal left posterior cerebral artery. 4. No other significant proximal stenosis, aneurysm, or branch vessel occlusion within the Circle of Willis. 5. Stable moderate atrophy and white matter disease. 6. Remote infarct of the left cerebellar peduncle. Electronically Signed   By: San Morelle M.D.   On: 08/14/2019 14:12   MR BRAIN WO CONTRAST  Result Date: 08/14/2019 CLINICAL DATA:  TIA. Personal history of cerebellar infarct. Aphasia. EXAM: MRI HEAD WITHOUT CONTRAST TECHNIQUE: Multiplanar, multiecho pulse sequences of the brain and surrounding structures were obtained without intravenous contrast. COMPARISON:  CTA head and neck 08/14/2019. MR head without contrast 11/26/2016 FINDINGS: Brain: A 4 mm subcortical white matter infarct is present in the left occipital lobe. Other acute infarct is present. A remote left cerebral peduncle and superior cerebellar infarct is present. A small remote lacunar infarct is present in the  right cerebellum. Moderate generalized atrophy is present. The ventricles are proportionate to the degree of atrophy. No significant extraaxial fluid collection is present. Vascular: Flow is present in the major intracranial arteries. Skull and upper cervical spine: The craniocervical junction is normal. Upper cervical spine is within normal limits.  Marrow signal is unremarkable. Sinuses/Orbits: There is scattered ethmoid opacification. Fluid is present in the right mastoid air cells. No obstructing nasopharyngeal lesion is present. The paranasal sinuses are otherwise clear. The globes and orbits are within normal limits. IMPRESSION: 1. 4 mm acute nonhemorrhagic subcortical white matter infarct in the left occipital lobe. 2. Remote left cerebral peduncle and superior cerebellar peduncle infarct. 3. Remote lacunar infarct of the right cerebellum. 4. Moderate generalized atrophy. Electronically Signed   By: Marin Roberts M.D.   On: 08/14/2019 19:07     Assessment/Plan:  73 y.o. male history of cerebellar stroke in [place], depression, hypertension, IBS, subdural hemorrhage, who presented to the hospital today because of speech problems.  According to his wife woke up around 6 AM this morning and his wife noticed that the patient could not get his words out and speech was gibberish.  Patient was confused.  This persisted so patient was brought to the emergency room for further evaluation. It appears patient has been having progressive memory problems now improved and appears to be back to baseline.   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 - L occipital stroke on MRI - likely small vessel disease - Pt is on ASA and Plavix already. No changes at this time - Suspect component of vascular dementia that is likely progressive - Neurology follow up as out patient - d/c planning based on pt/ot 08/15/2019, 11:20 AM

## 2019-08-15 NOTE — Progress Notes (Signed)
Pt is being discharged home.  Discharge papers given and explained to pt and spouse.  Spouse verbalized understanding.  Meds reviewed. No f/u appointments nor Rx at this time.

## 2019-08-15 NOTE — Progress Notes (Signed)
SLP Cancellation Note  Patient Details Name: Maurice Moses MRN: [id number] DOB: [birth year]   Cancelled treatment:       Reason Eval/Treat Not Completed: (chart reviewed; pt is preparing for Discharge currently ). Pt was brought to the hospital d/t "speech problems". 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 Pt is currently verbally engaging w/ others including Rehab staff; Impulsive during his assessment at times. He receives assistance w/ ADLs from Wife. Wife is also asking about information for Sitters for the home. He has been recommended to have 24hr Supervision.  Neurology also reports "Suspect component of vascular dementia that is likely progressive". Post Evaluation of pt this today, Neurology stated pt to be having progressive memory problems, now improved, and appears to be back to baseline. Also stated in his note, "Suspect component of vascular dementia that is likely progressive".  As pt is preparing to Discharge, and w/ pt being able to verbalize wants/needs adequately w/ support in ADLs from Wife as is his baseline, recommend pt/Wife f/u w/ Neurology post discharge for ongoing assessment and referral to Outpatient skilled ST services for any new Cognitive-linguistic decline from his baseline.      Orinda Kenner, MS, CCC-SLP [facility] 08/15/2019, 1:51 PM

## 2019-08-15 NOTE — TOC Transition Note (Signed)
Transition of Care [facility]) - CM/SW Discharge Note   Patient Details  Name: Maurice Moses MRN: [id number] Date of Birth: 1945/08/28  [facility]) CM/SW Contact:  Marshell Garfinkel, RN Phone Number: 08/15/2019, 2:06 PM   Clinical Narrative:     RN called RNCM to speak with patient about home health. Both patient and wife have declined therapy. Wife said they will call [name] if he changes his mind.  No current TOC needs.   Final next level of care: Home/Self Care Barriers to Discharge: No Barriers Identified   Patient Goals and CMS Choice Patient states their goals for this hospitalization and ongoing recovery are:: "I will follow up as outpatient" CMS Medicare.gov Compare Post Acute Care list provided to:: Patient Represenative (must comment)(Ms [place]) Choice offered to / list presented to : Spouse  Discharge Placement                       Discharge Plan and Services                          HH Arranged: Patient Refused [facility]          Social Determinants of Health (SDOH) Interventions     Readmission Risk Interventions No flowsheet data found.

## 2019-08-15 NOTE — Evaluation (Signed)
Physical Therapy Evaluation Patient Details Name: Maurice Moses MRN: [id number] DOB: [birth year] Today's Date: 08/15/2019   History of Present Illness  Pt is a 73 year old M admitted for stroke workup after c/o aphasia.  Imaging revealed a 4 mm acute nonhemorrhagic stroke in L occipital region.  PMH includes cereballar stroke, Htn and depression.  Clinical Impression  Pt is a 73 year old M who lives in a 2 story home with his wife and is a household ambulator with intermittent use of AD at baseline.  Pt generally impulsive and unable to provide full history and resistant to many requests for social hx and balance testing throughout evaluation.  Pt with generally fair to good strength with minimal asymmetry of strength, weakness of R side compared to L.  Pt did require multiple attempts to stand from a lower surface but able to manage with supervision after bed elevated.  Pt ambulated 35 ft without AD, appearing mildly unsteady at times with one self corrected LOB during turning.  He did present with deficits during dynamic gait activity and preferred unilateral UE support when standing, but refused formal balance testing.  Pt assisted with return to bed and changing bedding after incontinent episode.  Nursing informed. Pt will continue to benefit from skilled PT with focus on balance, safe functional mobility and safe mobility.    Follow Up Recommendations Home health PT;Supervision/Assistance - 24 hour    Equipment Recommendations       Recommendations for Other Services       Precautions / Restrictions Precautions Precautions: Fall Restrictions Weight Bearing Restrictions: No      Mobility  Bed Mobility Overal bed mobility: Modified Independent             General bed mobility comments: Increased time  Transfers Overall transfer level: Needs assistance Equipment used: None Transfers: Sit to/from Stand Sit to Stand: From elevated surface;Min guard         General  transfer comment: Required multiple attempts from low surface but was able to stand from elevated bedside without assistance.  Ambulation/Gait Ambulation/Gait assistance: Min guard Gait Distance (Feet): 35 Feet Assistive device: None     Gait velocity interpretation: 1.31 - 2.62 ft/sec, indicative of limited community ambulator General Gait Details: Step through gait pattern with low foot clearance but no outstanding deviations.  Tendency to hold to furniture when available, one slight posterolateral LOB to R side when turning to L at door but pt able to self correct.  Pt would be more stable with a SPC which was discussed with pt.  He stated that he has one at home.  Stairs            Wheelchair Mobility    Modified Rankin (Stroke Patients Only)       Balance Overall balance assessment: Needs assistance Sitting-balance support: Feet supported Sitting balance-Leahy Scale: Normal     Standing balance support: Single extremity supported Standing balance-Leahy Scale: Fair Standing balance comment: Intermittent unilateral support with dynamic activity.  Tendency to lean anteriorly against counter when standing at sink.  When asked to initiate balance testing, pt asked what the need was for the testing.  He was not open to continuing after explanation.             High level balance activites: Direction changes;Turns;Sudden stops;Head turns;Side stepping High Level Balance Comments: Slows gait and appears mildly unsteady on feet.             Pertinent Vitals/Pain Pain Assessment:  No/denies pain    Home Living Family/patient expects to be discharged to:: Private residence Living Arrangements: Spouse/significant other Available Help at Discharge: Family;Available 24 hours/day Type of Home: House Home Access: Stairs to enter   [employer] of Steps: 5 Home Layout: Two level;Able to live on main level with bedroom/bathroom Home Equipment: Gilford Rile - 2  wheels;Cane - single point;Wheelchair - manual Additional Comments: Pt stated that he has grab bars at toilet, "which they don't let you have here".  Reports no need for AD with household ambulation.  Pt a questionable historian and resistant to some questions.  He did state that he has a walk in shower and previous documentation reports a tub/shower unit.    Prior Function Level of Independence: Independent         Comments: Pt reports complete independence.  Previous documentation does state that pt required help from wife for donning shoes and had experienced multiple falls.     Hand Dominance   Dominant Hand: Right    Extremity/Trunk Assessment   Upper Extremity Assessment Upper Extremity Assessment: Overall WFL for tasks assessed(Bilateral strength UE: grip, elbow flexion, shoulder flexion: 4/5 with no report of N/T.  Bilateral strength: elbow extension: 4-/5)    Lower Extremity Assessment Lower Extremity Assessment: Overall WFL for tasks assessed(Ankle DF/PF, knee flex/ext: 4/5 bilat.)    Cervical / Trunk Assessment Cervical / Trunk Assessment: Normal  Communication   Communication: Expressive difficulties  Cognition Arousal/Alertness: Awake/alert Behavior During Therapy: WFL for tasks assessed/performed;Restless;Agitated;Impulsive Overall Cognitive Status: No family/caregiver present to determine baseline cognitive functioning                                 General Comments: Pt oriented to location and situation but with ST memory loss, asking multiple times "what we're doing this for" throughout evaluation, after PT explaining.  Pt impulsive and asked if he would be more likely to "get out of here" if he caused a disturbance.  PT, of course, discouraged him from this.      General Comments      Exercises Other Exercises Other Exercises: Education: benefit of PT, balance assessment x4 min Other Exercises: Assistance with bedding and positioning after  episode of incontinence x4 min   Assessment/Plan    PT Assessment Patient needs continued PT services  PT Problem List Decreased strength;Decreased mobility;Decreased activity tolerance;Decreased balance;Decreased knowledge of use of DME;Decreased cognition;Decreased safety awareness       PT Treatment Interventions DME instruction;Therapeutic activities;Gait training;Therapeutic exercise;Patient/family education;Stair training;Balance training;Functional mobility training;Neuromuscular re-education;Cognitive remediation    PT Goals (Current goals can be found in the Care Plan section)  Acute Rehab PT Goals PT Goal Formulation: Patient unable to participate in goal setting    Frequency 7X/week   Barriers to discharge        Co-evaluation               AM-PAC PT "6 Clicks" Mobility  Outcome Measure Help needed turning from your back to your side while in a flat bed without using bedrails?: A Little Help needed moving from lying on your back to sitting on the side of a flat bed without using bedrails?: A Little Help needed moving to and from a bed to a chair (including a wheelchair)?: A Little Help needed standing up from a chair using your arms (e.g., wheelchair or bedside chair)?: A Little Help needed to walk in hospital room?: A Little  Help needed climbing 3-5 steps with a railing? : A Little 6 Click Score: 18    End of Session Equipment Utilized During Treatment: Gait belt Activity Tolerance: Patient tolerated treatment well;Treatment limited secondary to agitation Patient left: in bed;with bed alarm set;with call bell/phone within reach Nurse Communication: Mobility status PT Visit Diagnosis: Unsteadiness on feet (R26.81);Muscle weakness (generalized) (M62.81);Difficulty in walking, not elsewhere classified (R26.2)    Time: [id number] PT Time Calculation (min) (ACUTE ONLY): 29 min   Charges:   PT Evaluation $PT Eval Low Complexity: 1 Low PT  Treatments $Therapeutic Activity: 8-22 mins        Glenetta HewSarah Trayvond Viets, PT, DPT   Glenetta HewSarah Ayerim Berquist 08/15/2019, 10:28 AM

## 2019-08-17 LAB — POCT I-STAT CREATININE: Creatinine, Ser: 1.2 mg/dL (ref 0.61–1.24)

## 2019-09-16 ENCOUNTER — Ambulatory Visit: Payer: Medicare Other | Admitting: Internal Medicine

## 2019-09-23 ENCOUNTER — Other Ambulatory Visit: Payer: Self-pay

## 2019-09-23 ENCOUNTER — Encounter: Payer: Self-pay | Admitting: Internal Medicine

## 2019-09-23 ENCOUNTER — Ambulatory Visit (INDEPENDENT_AMBULATORY_CARE_PROVIDER_SITE_OTHER): Payer: Medicare PPO | Admitting: Internal Medicine

## 2019-09-23 VITALS — BP 154/87 | HR 96 | Ht 69.0 in | Wt 240.0 lb

## 2019-09-23 DIAGNOSIS — R9431 Abnormal electrocardiogram [ECG] [EKG]: Secondary | ICD-10-CM | POA: Diagnosis not present

## 2019-09-23 DIAGNOSIS — I639 Cerebral infarction, unspecified: Secondary | ICD-10-CM

## 2019-09-23 DIAGNOSIS — I10 Essential (primary) hypertension: Secondary | ICD-10-CM

## 2019-09-23 DIAGNOSIS — E785 Hyperlipidemia, unspecified: Secondary | ICD-10-CM | POA: Diagnosis not present

## 2019-09-23 NOTE — Patient Instructions (Signed)
Medication Instructions:  Your physician recommends that you continue on your current medications as directed. Please refer to the Current Medication list given to you today.  *If you need a refill on your cardiac medications before your next appointment, please call your pharmacy*  Lab Work: none If you have labs (blood work) drawn today and your tests are completely normal, you will receive your results only by: . MyChart Message (if you have MyChart) OR . A paper copy in the mail If you have any lab test that is abnormal or we need to change your treatment, we will call you to review the results.  Testing/Procedures: Your physician has requested that you have an echocardiogram. Echocardiography is a painless test that uses sound waves to create images of your heart. It provides your doctor with information about the size and shape of your heart and how well your heart's chambers and valves are working. This procedure takes approximately one hour. There are no restrictions for this procedure. You may get an IV, if needed, to receive an ultrasound enhancing agent through to better visualize your heart.   Follow-Up: At [facility], you and your health needs are our priority.  As part of our continuing mission to provide you with exceptional heart care, we have created designated Provider Care Teams.  These Care Teams include your primary Cardiologist (physician) and Advanced Practice Providers (APPs -  Physician Assistants and Nurse Practitioners) who all work together to provide you with the care you need, when you need it.  Your next appointment:   1 month(s)  The format for your next appointment:   In Person  Provider:    You may see DR CHRISTOPHER END or one of the following Advanced Practice Providers on your designated Care Team:    Christopher Berge, NP  Ryan Dunn, PA-C  Jacquelyn Visser, PA-C   Echocardiogram An echocardiogram is a procedure that uses painless sound  waves (ultrasound) to produce an image of the heart. Images from an echocardiogram can provide important information about:  Signs of coronary artery disease (CAD).  Aneurysm detection. An aneurysm is a weak or damaged part of an artery wall that bulges out from the normal force of blood pumping through the body.  Heart size and shape. Changes in the size or shape of the heart can be associated with certain conditions, including heart failure, aneurysm, and CAD.  Heart muscle function.  Heart valve function.  Signs of a past heart attack.  Fluid buildup around the heart.  Thickening of the heart muscle.  A tumor or infectious growth around the heart valves. Tell a health care provider about:  Any allergies you have.  All medicines you are taking, including vitamins, herbs, eye drops, creams, and over-the-counter medicines.  Any blood disorders you have.  Any surgeries you have had.  Any medical conditions you have.  Whether you are pregnant or may be pregnant. What are the risks? Generally, this is a safe procedure. However, problems may occur, including:  Allergic reaction to dye (contrast) that may be used during the procedure. What happens before the procedure? No specific preparation is needed. You may eat and drink normally. What happens during the procedure?   An IV tube may be inserted into one of your veins.  You may receive contrast through this tube. A contrast is an injection that improves the quality of the pictures from your heart.  A gel will be applied to your chest.  A wand-like tool (transducer)   will be moved over your chest. The gel will help to transmit the sound waves from the transducer.  The sound waves will harmlessly bounce off of your heart to allow the heart images to be captured in real-time motion. The images will be recorded on a computer. The procedure may vary among health care providers and hospitals. What happens after the  procedure?  You may return to your normal, everyday life, including diet, activities, and medicines, unless your health care provider tells you not to do that. Summary  An echocardiogram is a procedure that uses painless sound waves (ultrasound) to produce an image of the heart.  Images from an echocardiogram can provide important information about the size and shape of your heart, heart muscle function, heart valve function, and fluid buildup around your heart.  You do not need to do anything to prepare before this procedure. You may eat and drink normally.  After the echocardiogram is completed, you may return to your normal, everyday life, unless your health care provider tells you not to do that. This information is not intended to replace advice given to you by your health care provider. Make sure you discuss any questions you have with your health care provider. Document Revised: 11/27/2018 Document Reviewed: 09/08/2016 Elsevier Patient Education  2020 Elsevier Inc.  

## 2019-09-23 NOTE — Progress Notes (Addendum)
New Outpatient Visit Date: 09/23/2019  Referring Provider: Butler Denmark, MD [facility], [facility] [address] [place]. [place], [place] [address]  Chief Complaint: Establish cardiology care  HPI:  [name] is a 74 y.o. male who is being seen today for the evaluation of recurrent strokes at the request of Dr. Emelda Fear. He has a history of recurrent strokes (2016 and 2020) with concern for cardioembolic source s/p implantable loop record (reportedly at EOL), traumatic subdural hematoma (2017), hypertension, depression, and IBS.  [name] was hospitalized in late 07/2019 with acute onset of dysarthria and was diagnosed with acute subcortical white matter infarct in the left occipital lobe.  He notes that his speech has returned to baseline.  He suffered a cerebellar stroke in 2016 and was told that this was potentially cardioembolic.  He does not have any significant residual deficits.  Following his stroke in 2016, he underwent TEE and loop recorder placement.  TEE noted atrial septal aneurysm.  Loop recorder showed no significant arrhythmia other than a single 3.5-second pause; no atrial fibrillation/flutter was ever identified per outside records.  [name] denies a history of prior heart disease, though he notes an episode 11 years ago where he suddenly felt very tired with shortness of breath while walking.  He had to rest for about 15 minutes before being able to continue to his car.  He denies ever having had chest pain.  For the last 6 years, however, his activity has been quite limited.  He denies any recent dyspnea as well as orthopnea and PND.  He notes dependent edema that resolves with leg elevation.  He has not had any palpitations and reports only very rare episodes of transient lightheadedness.  In regard to his subdural hematoma in 2017, [name] does not recall the circumstances.  It sounds as though he fell and struck his head.  He was transferred to [facility] and underwent observation without neurosurgical intervention.  He remains on aspirin and clopidogrel.  --------------------------------------------------------------------------------------------------  Cardiovascular History & Procedures: Cardiovascular Problems:  Recurrent strokes with question of cardioembolic nature  Risk Factors:  Strokes, hypertension, hyperlipidemia, male gender, obesity, prior tobacco use, and age greater than 43  Cath/PCI:  None  CV Surgery:  None  EP Procedures and Devices:  None  Non-Invasive Evaluation(s):  TTE (11/26/2016): Technically difficult study.  LVEF approximately 65% with grade 1 diastolic dysfunction.  Mild mitral regurgitation.  Mild biatrial enlargement.  Carotid artery Doppler (11/26/2016): Mild to moderate plaque in the carotid bulbs bilaterally with less than 50% stenosis.  TEE (10/25/2015): Normal LV size.  LVEF 55 to 60%.  No significant valvular abnormality.  Atrial septal aneurysm.  No right-to-left shunt per report.  (On my review of images, the bubble study is positive and there may be mild right-to-left flow by color Doppler; findings consistent with PFO and ASA).  Recent CV Pertinent Labs: Lab Results  Component Value Date   CHOL 153 08/15/2019   HDL 48 08/15/2019   LDLCALC 69 08/15/2019   TRIG 180 (H) 08/15/2019   CHOLHDL 3.2 08/15/2019   INR 1.0 08/14/2019   K 3.6 08/15/2019   MG 2.0 11/26/2016   BUN 9 08/15/2019   CREATININE 1.00 08/15/2019    --------------------------------------------------------------------------------------------------  Past Medical History:  Diagnosis Date  . Cerebellar stroke (HCC)   . Depression   . Hx of endoscopy 09/02/2012  . Hypertension   . IBS (irritable bowel syndrome)   . S/P colonoscopy 09/02/2012  .  Stroke (cerebrum) ([place])   . Stroke ([place])   . Subdural hemorrhage [facility])     Past Surgical History:  Procedure Laterality Date  . LOOP RECORDER INSERTION       Current Meds  Medication Sig  . aspirin EC 81 MG EC tablet Take 1 tablet (81 mg total) by mouth daily.  Marland Kitchen atorvastatin (LIPITOR) 40 MG tablet Take 1 tablet (40 mg total) by mouth daily at 6 PM.  . cetirizine (ZYRTEC) 10 MG tablet Take 10 mg by mouth daily.  . cholecalciferol (VITAMIN D) 1000 units tablet Take 2,000 Units by mouth daily.  . clopidogrel (PLAVIX) 75 MG tablet Take 1 tablet (75 mg total) by mouth daily.  Marland Kitchen docusate sodium (COLACE) 100 MG capsule Take 300 mg by mouth daily.   Marland Kitchen escitalopram (LEXAPRO) 20 MG tablet Take 20 mg by mouth daily.  Marland Kitchen esomeprazole (NEXIUM) 40 MG capsule Take 40 mg by mouth daily at 12 noon.  . hydrochlorothiazide (HYDRODIURIL) 25 MG tablet Take 25 mg by mouth daily.  Marland Kitchen HYDROcodone-acetaminophen (NORCO/VICODIN) 5-325 MG tablet Take 1-2 tablets by mouth every 4 (four) hours as needed for moderate pain (Max 7 per day).  . mirtazapine (REMERON SOL-TAB) 30 MG disintegrating tablet Take 1 tablet by mouth daily.  . Omega-3 Fatty Acids (FISH OIL) 1000 MG CAPS Take 1,000 mg by mouth every morning.  . quinapril (ACCUPRIL) 40 MG tablet Take 40 mg by mouth at bedtime.    Allergies: Doxycycline and Morphine and related  Social History   Tobacco Use  . Smoking status: Former Smoker    Packs/day: 1.00    Years: 30.00    Pack years: 30.00    Types: Cigarettes    Quit date: 09/22/1977    Years since quitting: 42.0  . Smokeless tobacco: Never Used  Substance Use Topics  . Alcohol use: Yes    Alcohol/week: 2.0 standard drinks    Types: 2 Glasses of wine per week  . Drug use: No    Family History  Problem Relation Age of Onset  . Hypertension Other   . Parkinson's disease Mother   . Stomach cancer Father   . Hypertension Father   . Parkinson's disease Brother     Review of Systems: A 12-system review of systems was performed and was negative except as noted in the HPI.   --------------------------------------------------------------------------------------------------  Physical Exam: BP (!) 154/87 (BP Location: Right Arm, Patient Position: Sitting, Cuff Size: Normal)   Pulse 96   Ht 5\' 9"  (1.753 m)   Wt 240 lb (108.9 kg)   SpO2 97%   BMI 35.44 kg/m   General:  NAD.  Accompanied by his wife. HEENT: No conjunctival pallor or scleral icterus. Facemask in place. Neck: Supple without lymphadenopathy, thyromegaly, JVD, or HJR. No carotid bruit. Lungs: Normal work of breathing. Clear to auscultation bilaterally without wheezes or crackles. Heart: Regular rate and rhythm without murmurs, rubs, or gallops. Non-displaced PMI. Abd: Bowel sounds present. Soft, NT/ND without hepatosplenomegaly Ext: No lower extremity edema. Radial, PT, and DP pulses are 2+ bilaterally Skin: Warm and dry without rash. Neuro: CNIII-XII intact. Strength and fine-touch sensation intact in upper and lower extremities bilaterally. Psych: Normal mood and affect.  EKG:  NSR with 1st degree AV block, leftward axis, possible inferior Q waves and poor R wave progression.  No significant change since 08/14/2019.  Lab Results  Component Value Date   WBC 12.7 (H) 08/14/2019   HGB 15.8 08/14/2019   HCT 44.4 08/14/2019  MCV 85.7 08/14/2019   PLT 294 08/14/2019    Lab Results  Component Value Date   NA 136 08/15/2019   K 3.6 08/15/2019   CL 98 08/15/2019   CO2 27 08/15/2019   BUN 9 08/15/2019   CREATININE 1.00 08/15/2019   GLUCOSE 98 08/15/2019   ALT 33 08/15/2019    Lab Results  Component Value Date   CHOL 153 08/15/2019   HDL 48 08/15/2019   LDLCALC 69 08/15/2019   TRIG 180 (H) 08/15/2019   CHOLHDL 3.2 08/15/2019     --------------------------------------------------------------------------------------------------  ASSESSMENT AND PLAN: Recurrent strokes: [name] has suffered strokes in 2016 and 07/2019 but reports minimal residual deficits.  Prior workup for  cryptogenic stroke includes multiple TTE's (most recently 11/2016), TEE (10/2015), and ILR.  TEE report states no evidence of right-to-left shunt, though on personal review of images today, findings are consistent with atrial septal aneurysm and patent foramen ovale with some right-to-left flow.  ILR reportedly showed no atrial fibrillation or flutter, with the device now reportedly being at Shabre Kreher-of-life.  I will review these findings with Dr. Emelda Fear regarding her thoughts on antiplatelet therapy, anticoagulation, and PFO closure.  The patient's age makes PFO closure less appealing, but given recurrent strokes while on DAPT, we may need to consider this.  Hypercoagulable workup and potential long-term anticoagulation would be another option, though given fall with subdural hematoma in 2017, I am hesitant to move forward with this as well.  I will not make any changes to [name] medications today and will repeat an echo with bubble study.  [name] is interested in having his loop recorder removed; I will reach out to our EP team to see if this is feasible.  Abnormal EKG: EKG today with evidence of possible prior anterior and inferior MI's, though [name] denies significant cardiac symptoms other than an isolated episode of marked fatigue and dyspnea several years ago.  Of note, however, he is quite limited in regard to his activities.  We will begin with echo, as outlined above, and discuss need for further ischemia evaluation based on the results.  Hyperlipidemia: Continue atorvastatin 40 mg daily for secondary prevention of stroke, as LDL at goal on last check in 12/202; ongoing follow-up and management per Drs. Linfors and Farin.  Hypertension: Blood pressure mildly elevated today.  I encouraged sodium restriction and continuation of current regimen of HCTZ and quinapril.  Further medication adjustments per Dr. Doug Sou.  Follow-up: Return to clinic in 1 month.  Yvonne Kendall, MD 09/25/2019  10:34 AM

## 2019-09-25 DIAGNOSIS — E785 Hyperlipidemia, unspecified: Secondary | ICD-10-CM | POA: Insufficient documentation

## 2019-09-25 DIAGNOSIS — R9431 Abnormal electrocardiogram [ECG] [EKG]: Secondary | ICD-10-CM | POA: Insufficient documentation

## 2019-09-25 DIAGNOSIS — I10 Essential (primary) hypertension: Secondary | ICD-10-CM | POA: Insufficient documentation

## 2019-10-15 ENCOUNTER — Telehealth: Payer: Self-pay | Admitting: Internal Medicine

## 2019-10-15 ENCOUNTER — Other Ambulatory Visit: Payer: Self-pay | Admitting: Internal Medicine

## 2019-10-15 ENCOUNTER — Ambulatory Visit (INDEPENDENT_AMBULATORY_CARE_PROVIDER_SITE_OTHER): Payer: Medicare PPO

## 2019-10-15 ENCOUNTER — Other Ambulatory Visit: Payer: Self-pay

## 2019-10-15 DIAGNOSIS — I10 Essential (primary) hypertension: Secondary | ICD-10-CM | POA: Diagnosis not present

## 2019-10-15 DIAGNOSIS — I639 Cerebral infarction, unspecified: Secondary | ICD-10-CM | POA: Diagnosis not present

## 2019-10-15 DIAGNOSIS — Z87891 Personal history of nicotine dependence: Secondary | ICD-10-CM | POA: Diagnosis not present

## 2019-10-15 DIAGNOSIS — E785 Hyperlipidemia, unspecified: Secondary | ICD-10-CM

## 2019-10-15 MED ORDER — PERFLUTREN LIPID MICROSPHERE
1.0000 mL | INTRAVENOUS | Status: AC | PRN
  Administered 2019-10-15: 2 mL via INTRAVENOUS

## 2019-10-15 NOTE — Telephone Encounter (Signed)
Spoke with patient at check in  Explained we had a message to schedule an appointment with Dr Graciela Husbands for loop recorder removal  Patient stated that he is not interested in having loop recorder removed and did not wish to schedule  Please advise if anything further needed

## 2019-10-15 NOTE — Telephone Encounter (Signed)
Noted  

## 2019-10-15 NOTE — Telephone Encounter (Signed)
-----  Message from Emily Filbert, RN sent at 10/15/2019  9:41 AM EST ----- Regarding: FW: Loop removal Hey ladies- do you mind reaching out to the patient and schedule for a consult for Dr. Caryl Comes for possible loop recorder explant. We have not seen him before- he will need to be in a 40 min slot. Either a Tuesday/ Thursday is fine. Beginning of the morning: 8:20 am slot with the next patient not until 9:00 am- or in the 11:20 am slot, with no 11:40 am slot.   This is not urgent.   Thank you! ----- Message ----- From: Deboraha Sprang, MD Sent: 10/08/2019   4:22 PM EST To: Emily Filbert, RN Subject: RE: Loop removal                               Sure  Thx ----- Message ----- From: Emily Filbert, RN Sent: 10/08/2019   4:18 PM EST To: Deboraha Sprang, MD, Emily Filbert, RN Subject: Melton Alar: Loop removal                               Are you ok with me putting this patient on the schedule- I guess as a consult and possible loop explant the same day?  ----- Message ----- From: Nelva Bush, MD Sent: 09/28/2019  11:57 AM EST To: Emily Filbert, RN Subject: Loop removal                                   Hi Heather,  I met Mr. Kitchings for the first time last week.  He has a LINQ placed at on outside facility a few years ago that is reportedly no longer functioning.  Dr. Caryl Comes said that he usually removes them in the office but needs a special kit to do this?  Would you be able to help coordinate this?  Let me know if any issues come up.  Thanks.  Gerald Stabs

## 2019-10-29 ENCOUNTER — Other Ambulatory Visit: Payer: Self-pay

## 2019-10-29 ENCOUNTER — Ambulatory Visit (INDEPENDENT_AMBULATORY_CARE_PROVIDER_SITE_OTHER): Payer: Medicare PPO | Admitting: Internal Medicine

## 2019-10-29 ENCOUNTER — Encounter: Payer: Self-pay | Admitting: Internal Medicine

## 2019-10-29 VITALS — BP 132/86 | HR 91 | Ht 68.0 in | Wt 249.4 lb

## 2019-10-29 DIAGNOSIS — I639 Cerebral infarction, unspecified: Secondary | ICD-10-CM

## 2019-10-29 DIAGNOSIS — R5382 Chronic fatigue, unspecified: Secondary | ICD-10-CM | POA: Diagnosis not present

## 2019-10-29 DIAGNOSIS — E785 Hyperlipidemia, unspecified: Secondary | ICD-10-CM | POA: Diagnosis not present

## 2019-10-29 DIAGNOSIS — I10 Essential (primary) hypertension: Secondary | ICD-10-CM | POA: Diagnosis not present

## 2019-10-29 NOTE — Patient Instructions (Addendum)
Dr End recommends you increase your activity with walking and work on losing weight.   Medication Instructions:  Your physician recommends that you continue on your current medications as directed. Please refer to the Current Medication list given to you today.  *If you need a refill on your cardiac medications before your next appointment, please call your pharmacy*  Follow-Up: You have been referred to Electrophysiology for removal of loop. Dr Graciela Husbands appt earlier in the morning per [facility].  At [facility], you and your health needs are our priority.  As part of our continuing mission to provide you with exceptional heart care, we have created designated Provider Care Teams.  These Care Teams include your primary Cardiologist (physician) and Advanced Practice Providers (APPs -  Physician Assistants and Nurse Practitioners) who all work together to provide you with the care you need, when you need it.  We recommend signing up for the patient portal called "MyChart".  Sign up information is provided on this After Visit Summary.  MyChart is used to connect with patients for Virtual Visits (Telemedicine).  Patients are able to view lab/test results, encounter notes, upcoming appointments, etc.  Non-urgent messages can be sent to your provider as well.   To learn more about what you can do with MyChart, go to [email or web address].    Your next appointment:   6 month(s)  The format for your next appointment:   In Person  Provider:    You may see DR Cristal Deer END or one of the following Advanced Practice Providers on your designated Care Team:    Nicolasa Ducking, NP  Eula Listen, PA-C  Marisue Ivan, PA-C

## 2019-10-29 NOTE — Progress Notes (Signed)
Follow-up Outpatient Visit Date: 10/29/2019  Primary Care Provider: Nelva Bush, MD [address] [id number]  Chief Complaint: Fatigue  HPI:  Mr. Cobern is a 74 y.o. male with history of recurrent strokes, traumatic subdural hematoma, hypertension, depression, and irritable bowel syndrome, who presents for follow-up of recurrent strokes.  I met Mr. Nauert a month ago for evaluation of recurrent strokes and potential cardioembolic source.  Loop recorder had been placed in 2017 and did not show any evidence of atrial fibrillation/flutter.  He had undergone TEE by Dr. Ubaldo Glassing in 2017, which noted atrial septal aneurysm but was ambiguous regarding intracardiac shunting.  I was ultimately able to review the images which are consistent with atrial septal aneurysm and right to left shunt most suggestive of a PFO.  We repeated a transthoracic echocardiogram with bubble study, which did not show obvious right to left shunting, though the images were suboptimal.  I spoke with Mr. Withem neurologist, who was reluctant to consider further anticoagulation due to history of traumatic subdural hematoma.  We discussed loop recorder removal, which Mr. Mota was initially in favor of though he subsequently canceled his visit with Dr. Caryl Comes.  Today, Mr. Geurts is primarily concerned about his fatigue.  He at times confuses fatigue with his stroke symptoms, which have resolved.  His wife notes that Mr. Blyden has been very sedentary and spends most of his days sitting in a chair.  This has been the case for at least 5 years.  He has not had any new neurologic deficits.  He denies chest pain, palpitations, lightheadedness, and edema.  Exertional dyspnea with minimal activity is unchanged.  Memory loss remains an issue.  --------------------------------------------------------------------------------------------------  Cardiovascular History & Procedures: Cardiovascular  Problems:  Recurrent strokes with question of cardioembolic source  Risk Factors:  Strokes, hypertension, hyperlipidemia, male gender, obesity, prior tobacco use, and age greater than 39  Cath/PCI:  None  CV Surgery:  None  EP Procedures and Devices:  None  Non-Invasive Evaluation(s):  TTE (10/15/2019): Normal LV size.  LVEF 60-65% with grade 1 diastolic dysfunction.  Normal RV size and function.  Negative saline contrast bubble study, though images are suboptimal.  TTE (11/26/2016): Technically difficult study.  LVEF approximately 65% with grade 1 diastolic dysfunction.  Mild mitral regurgitation.  Mild biatrial enlargement.  Carotid artery Doppler (11/26/2016): Mild to moderate plaque in the carotid bulbs bilaterally with less than 50% stenosis.  TEE (10/25/2015): Normal LV size.  LVEF 55 to 60%.  No significant valvular abnormality.  Atrial septal aneurysm.  No right-to-left shunt per report.  (On my review of images, the bubble study is positive and there may be mild right-to-left flow by color Doppler; findings consistent with PFO and ASA).  Recent CV Pertinent Labs: Lab Results  Component Value Date   CHOL 153 08/15/2019   HDL 48 08/15/2019   LDLCALC 69 08/15/2019   TRIG 180 (H) 08/15/2019   CHOLHDL 3.2 08/15/2019   INR 1.0 08/14/2019   K 3.6 08/15/2019   MG 2.0 11/26/2016   BUN 9 08/15/2019   CREATININE 1.00 08/15/2019    Past medical and surgical history were reviewed and updated in EPIC.  Current Meds  Medication Sig  . aspirin EC 81 MG EC tablet Take 1 tablet (81 mg total) by mouth daily.  Marland Kitchen atorvastatin (LIPITOR) 40 MG tablet Take 1 tablet (40 mg total) by mouth daily at 6 PM.  . cetirizine (ZYRTEC) 10 MG tablet Take 10 mg  by mouth daily.  . cholecalciferol (VITAMIN D) 1000 units tablet Take 2,000 Units by mouth daily.  . clopidogrel (PLAVIX) 75 MG tablet Take 1 tablet (75 mg total) by mouth daily.  Marland Kitchen docusate sodium (COLACE) 100 MG capsule Take 300 mg  by mouth daily.   Marland Kitchen escitalopram (LEXAPRO) 20 MG tablet Take 20 mg by mouth daily.  Marland Kitchen esomeprazole (NEXIUM) 40 MG capsule Take 40 mg by mouth daily at 12 noon.  . hydrochlorothiazide (HYDRODIURIL) 25 MG tablet Take 25 mg by mouth daily.  Marland Kitchen HYDROcodone-acetaminophen (NORCO/VICODIN) 5-325 MG tablet Take 1-2 tablets by mouth every 4 (four) hours as needed for moderate pain (Max 7 per day).  . mirtazapine (REMERON SOL-TAB) 30 MG disintegrating tablet Take 1 tablet by mouth daily.  . Omega-3 Fatty Acids (FISH OIL) 1000 MG CAPS Take 1,000 mg by mouth every morning.  . quinapril (ACCUPRIL) 40 MG tablet Take 40 mg by mouth at bedtime.    Allergies: Doxycycline and Morphine and related  Social History   Tobacco Use  . Smoking status: Former Smoker    Packs/day: 1.00    Years: 30.00    Pack years: 30.00    Types: Cigarettes    Quit date: 09/22/1977    Years since quitting: 42.1  . Smokeless tobacco: Never Used  Substance Use Topics  . Alcohol use: Yes    Alcohol/week: 2.0 standard drinks    Types: 2 Glasses of wine per week  . Drug use: No    Family History  Problem Relation Age of Onset  . Hypertension Other   . Parkinson's disease Mother   . Stomach cancer Father   . Hypertension Father   . Parkinson's disease Brother     Review of Systems: Review of Systems  Constitutional: Positive for malaise/fatigue.  Respiratory: Negative.   Cardiovascular: Negative.   Gastrointestinal: Negative.   Neurological: Negative.    --------------------------------------------------------------------------------------------------  Physical Exam: BP 132/86 (BP Location: Left Arm, Patient Position: Sitting, Cuff Size: Normal)   Pulse 91   Ht 5' 8"  (1.727 m)   Wt 249 lb 6 oz (113.1 kg)   SpO2 98%   BMI 37.92 kg/m   General: NAD. Neck: No obvious JVD or HJR, though body habitus limits evaluation. Respiratory: Mildly diminished breath sounds throughout without wheezes or crackles. Heart:  Regular rate and rhythm without murmurs, rubs, or gallops. Abdomen: Round and soft.  No tenderness. Extremities: Trace pretibial edema.  EKG: Normal sinus rhythm with first-degree AV block, left axis deviation, poor R wave progression, and inferior Q waves.  No significant change since 09/23/2019.  Lab Results  Component Value Date   WBC 12.7 (H) 08/14/2019   HGB 15.8 08/14/2019   HCT 44.4 08/14/2019   MCV 85.7 08/14/2019   PLT 294 08/14/2019    Lab Results  Component Value Date   NA 136 08/15/2019   K 3.6 08/15/2019   CL 98 08/15/2019   CO2 27 08/15/2019   BUN 9 08/15/2019   CREATININE 1.00 08/15/2019   GLUCOSE 98 08/15/2019   ALT 33 08/15/2019    Lab Results  Component Value Date   CHOL 153 08/15/2019   HDL 48 08/15/2019   LDLCALC 69 08/15/2019   TRIG 180 (H) 08/15/2019   CHOLHDL 3.2 08/15/2019    --------------------------------------------------------------------------------------------------  ASSESSMENT AND PLAN: Recurrent strokes: Mr. Erie Noe has not had any new focal neurologic deficits.  Since our last visit, transthoracic echocardiogram with bubble study did not reveal any significant abnormalities to explain recurrent  strokes.  I was able to personally review the images from his TEE in 2017, which show an atrial septal aneurysm as well as some right to left shunting by bubble study.  Overall, the shunt appears to be relatively small.  Given his age and other comorbidities, I am not certain that PFO closure would provide significant benefit.  I will touch base with our structural heart team regarding their thoughts.  In the meantime, I think is reasonable to continue with dual antiplatelet therapy.  I spoke with Mr. Hemenway again about loop recorder explantation and he is now agreeable to proceeding with this.  We will make arrangements for him to be with Dr. Caryl Comes.  Chronic fatigue: I believe this is primarily driven by deconditioning and obesity.  Echocardiogram  showed preserved LVEF.  EKG again is notable for poor R wave progression and possible inferior Q waves.  Given lack of wall motion abnormality by echo, absence of chest pain, and underlying comorbidities (marked deconditioning, recurrent strokes, and memory loss), we have agreed to work on improving functional capacity rather than pursuing additional cardiac work-up at this time.  Hypertension: Blood pressure borderline today.  Continue current medications.  Hyperlipidemia: Continue high intensity statin therapy for secondary prevention of strokes.  Ongoing management per PCP and neurology.  Follow-up: Return to clinic in 6 months.  Nelva Bush, MD 10/29/2019 3:04 PM

## 2019-10-30 ENCOUNTER — Encounter: Payer: Self-pay | Admitting: Internal Medicine

## 2019-10-30 DIAGNOSIS — R5382 Chronic fatigue, unspecified: Secondary | ICD-10-CM | POA: Insufficient documentation

## 2019-11-03 ENCOUNTER — Other Ambulatory Visit: Payer: Self-pay

## 2019-11-03 ENCOUNTER — Ambulatory Visit (INDEPENDENT_AMBULATORY_CARE_PROVIDER_SITE_OTHER): Payer: Medicare PPO | Admitting: Internal Medicine

## 2019-11-03 ENCOUNTER — Encounter: Payer: Self-pay | Admitting: Internal Medicine

## 2019-11-03 ENCOUNTER — Telehealth: Payer: Self-pay | Admitting: Internal Medicine

## 2019-11-03 VITALS — BP 132/78 | HR 59 | Ht 68.0 in | Wt 252.5 lb

## 2019-11-03 DIAGNOSIS — Z959 Presence of cardiac and vascular implant and graft, unspecified: Secondary | ICD-10-CM

## 2019-11-03 DIAGNOSIS — I639 Cerebral infarction, unspecified: Secondary | ICD-10-CM | POA: Diagnosis not present

## 2019-11-03 DIAGNOSIS — Z4589 Encounter for adjustment and management of other implanted devices: Secondary | ICD-10-CM

## 2019-11-03 NOTE — Telephone Encounter (Signed)

## 2019-11-03 NOTE — Patient Instructions (Signed)
Medication Instructions:  - Your physician recommends that you continue on your current medications as directed. Please refer to the Current Medication list given to you today.  *If you need a refill on your cardiac medications before your next appointment, please call your pharmacy*   Lab Work: - none ordered  If you have labs (blood work) drawn today and your tests are completely normal, you will receive your results only by: Marland Kitchen MyChart Message (if you have MyChart) OR . A paper copy in the mail If you have any lab test that is abnormal or we need to change your treatment, we will call you to review the results.   Testing/Procedures: - none ordered   Follow-Up: At [facility], you and your health needs are our priority.  As part of our continuing mission to provide you with exceptional heart care, we have created designated Provider Care Teams.  These Care Teams include your primary Cardiologist (physician) and Advanced Practice Providers (APPs -  Physician Assistants and Nurse Practitioners) who all work together to provide you with the care you need, when you need it.  We recommend signing up for the patient portal called "MyChart".  Sign up information is provided on this After Visit Summary.  MyChart is used to connect with patients for Virtual Visits (Telemedicine).  Patients are able to view lab/test results, encounter notes, upcoming appointments, etc.  Non-urgent messages can be sent to your provider as well.   To learn more about what you can do with MyChart, go to [email or web address].    Your next appointment:   1 week(s)  The format for your next appointment:   Virtual Visit   Provider:   Sherryl Manges, MD   Other Instructions  1) You may shower tomorrow 2) You may take the tegaderm (top) dressing off on Saturday (3/20) 3) Steri strips should come off on their own (if after 7 days, they do not come off on their own, you may take them off)

## 2019-11-05 NOTE — Progress Notes (Signed)
      Patient Care Team: Linfors, Leane Call, MD as PCP - General (Internal Medicine)   HPI  Maurice Moses is a 74 y.o. male Pt with previously implanted loop recorder now at [facility] who would like it removed  Seen recently by Dr End  Records and Results Reviewed   Past Medical History:  Diagnosis Date  . Cerebellar stroke (HCC)   . Depression   . Hx of endoscopy 09/02/2012  . Hypertension   . IBS (irritable bowel syndrome)   . S/P colonoscopy 09/02/2012  . Stroke (cerebrum) (HCC)   . Stroke (HCC)   . Subdural hemorrhage [facility])     Past Surgical History:  Procedure Laterality Date  . LOOP RECORDER INSERTION      Current Meds  Medication Sig  . aspirin EC 81 MG EC tablet Take 1 tablet (81 mg total) by mouth daily.  Marland Kitchen atorvastatin (LIPITOR) 40 MG tablet Take 1 tablet (40 mg total) by mouth daily at 6 PM.  . cetirizine (ZYRTEC) 10 MG tablet Take 10 mg by mouth daily.  . cholecalciferol (VITAMIN D) 1000 units tablet Take 2,000 Units by mouth daily.  . clopidogrel (PLAVIX) 75 MG tablet Take 1 tablet (75 mg total) by mouth daily.  Marland Kitchen docusate sodium (COLACE) 100 MG capsule Take 300 mg by mouth daily.   Marland Kitchen escitalopram (LEXAPRO) 20 MG tablet Take 20 mg by mouth daily.  Marland Kitchen esomeprazole (NEXIUM) 40 MG capsule Take 40 mg by mouth daily at 12 noon.  . hydrochlorothiazide (HYDRODIURIL) 25 MG tablet Take 25 mg by mouth daily.  Marland Kitchen HYDROcodone-acetaminophen (NORCO/VICODIN) 5-325 MG tablet Take 1-2 tablets by mouth every 4 (four) hours as needed for moderate pain (Max 7 per day).  . mirtazapine (REMERON SOL-TAB) 30 MG disintegrating tablet Take 1 tablet by mouth daily.  . quinapril (ACCUPRIL) 40 MG tablet Take 40 mg by mouth at bedtime.    Allergies  Allergen Reactions  . Doxycycline Nausea And Vomiting  . Morphine And Related Other (See Comments)    Agitation - wife does not want him to have it      Review of Systems negative except from HPI and PMH  Physical Exam BP 132/78 (BP  Location: Left Arm, Patient Position: Sitting, Cuff Size: Large)   Pulse (!) 59   Ht 5\' 8"  (1.727 m)   Wt 252 lb 8 oz (114.5 kg)   SpO2 98%   BMI 38.39 kg/m  Well developed and nourished in no acute distress HENT normal Neck supple with JVP-  Loop recorder in place-submammary location Clear Regular rate and rhythm, no murmurs or gallops Abd-soft with active BS No Clubbing cyanosis edema Skin-warm and dry A & Oriented  Grossly normal sensory and motor function         CrCl cannot be calculated (Patient's most recent lab result is older than the maximum 21 days allowed.).   Assessment and  Plan  Loop recorder in place   Preop recorder at EOS Postop Dx same   Procedure:device explant  Local anesthesia was administered over the device, an incision made and carried down to the device and the device withdrawn. Benzoin-steristrip dressing applied  Cx: None        DG:UYQI, MD 11/05/2019 5:45 PM      Current medicines are reviewed at length with the patient today .  The patient does not  have concerns regarding medicines.

## 2019-11-12 ENCOUNTER — Telehealth: Payer: Self-pay | Admitting: Internal Medicine

## 2019-11-12 ENCOUNTER — Encounter: Payer: Self-pay | Admitting: Internal Medicine

## 2019-11-12 ENCOUNTER — Telehealth (INDEPENDENT_AMBULATORY_CARE_PROVIDER_SITE_OTHER): Payer: Medicare PPO | Admitting: Internal Medicine

## 2019-11-12 ENCOUNTER — Other Ambulatory Visit: Payer: Self-pay

## 2019-11-12 VITALS — BP 144/75 | HR 55 | Ht 68.0 in | Wt 243.2 lb

## 2019-11-12 DIAGNOSIS — Z959 Presence of cardiac and vascular implant and graft, unspecified: Secondary | ICD-10-CM

## 2019-11-12 NOTE — Progress Notes (Signed)
Wound check per video call for loop explant site  No erythema or swelling

## 2019-11-12 NOTE — Telephone Encounter (Signed)
I reviewed Mr. Derousse history and imaging with our structural heart team  We agree that benefit of PFO closure is minimal and do not recommend pursuing this procedure.  We will continue current medications for secondary prevention of stroke and follow-up as previously arranged.  Yvonne Kendall, MD [facility]

## 2019-12-22 ENCOUNTER — Emergency Department
Admission: EM | Admit: 2019-12-22 | Discharge: 2019-12-22 | Disposition: A | Discharge status: Home / Self Care | Payer: Medicare PPO | Attending: Student in an Organized Health Care Education/Training Program | Admitting: Student in an Organized Health Care Education/Training Program

## 2019-12-22 ENCOUNTER — Emergency Department: Payer: Medicare PPO

## 2019-12-22 ENCOUNTER — Other Ambulatory Visit: Payer: Self-pay

## 2019-12-22 DIAGNOSIS — R4182 Altered mental status, unspecified: Secondary | ICD-10-CM | POA: Diagnosis present

## 2019-12-22 DIAGNOSIS — Z7982 Long term (current) use of aspirin: Secondary | ICD-10-CM | POA: Insufficient documentation

## 2019-12-22 DIAGNOSIS — Z7901 Long term (current) use of anticoagulants: Secondary | ICD-10-CM | POA: Insufficient documentation

## 2019-12-22 DIAGNOSIS — Z87891 Personal history of nicotine dependence: Secondary | ICD-10-CM | POA: Insufficient documentation

## 2019-12-22 DIAGNOSIS — I639 Cerebral infarction, unspecified: Secondary | ICD-10-CM | POA: Insufficient documentation

## 2019-12-22 DIAGNOSIS — Z79899 Other long term (current) drug therapy: Secondary | ICD-10-CM | POA: Diagnosis not present

## 2019-12-22 DIAGNOSIS — R531 Weakness: Secondary | ICD-10-CM | POA: Insufficient documentation

## 2019-12-22 LAB — CBC
HCT: 44.8 % (ref 39.0–52.0)
Hemoglobin: 15.3 g/dL (ref 13.0–17.0)
MCH: 31 pg (ref 26.0–34.0)
MCHC: 34.2 g/dL (ref 30.0–36.0)
MCV: 90.7 fL (ref 80.0–100.0)
Platelets: 247 10*3/uL (ref 150–400)
RBC: 4.94 MIL/uL (ref 4.22–5.81)
RDW: 13.3 % (ref 11.5–15.5)
WBC: 9.2 10*3/uL (ref 4.0–10.5)
nRBC: 0 % (ref 0.0–0.2)

## 2019-12-22 LAB — URINALYSIS, COMPLETE (UACMP) WITH MICROSCOPIC
Bacteria, UA: NONE SEEN
Bilirubin Urine: NEGATIVE
Glucose, UA: NEGATIVE mg/dL
Hgb urine dipstick: NEGATIVE
Ketones, ur: NEGATIVE mg/dL
Leukocytes,Ua: NEGATIVE
Nitrite: NEGATIVE
Protein, ur: NEGATIVE mg/dL
Specific Gravity, Urine: 1.019 (ref 1.005–1.030)
pH: 5 (ref 5.0–8.0)

## 2019-12-22 LAB — BRAIN NATRIURETIC PEPTIDE: B Natriuretic Peptide: 45 pg/mL (ref 0.0–100.0)

## 2019-12-22 LAB — HEPATIC FUNCTION PANEL
ALT: 35 U/L (ref 0–44)
AST: 34 U/L (ref 15–41)
Albumin: 4 g/dL (ref 3.5–5.0)
Alkaline Phosphatase: 82 U/L (ref 38–126)
Bilirubin, Direct: 0.2 mg/dL (ref 0.0–0.2)
Indirect Bilirubin: 0.7 mg/dL (ref 0.3–0.9)
Total Bilirubin: 0.9 mg/dL (ref 0.3–1.2)
Total Protein: 6.8 g/dL (ref 6.5–8.1)

## 2019-12-22 LAB — PROTIME-INR
INR: 1 (ref 0.8–1.2)
Prothrombin Time: 12.8 seconds (ref 11.4–15.2)

## 2019-12-22 LAB — BASIC METABOLIC PANEL
Anion gap: 6 (ref 5–15)
BUN: 20 mg/dL (ref 8–23)
CO2: 27 mmol/L (ref 22–32)
Calcium: 9.6 mg/dL (ref 8.9–10.3)
Chloride: 106 mmol/L (ref 98–111)
Creatinine, Ser: 1.16 mg/dL (ref 0.61–1.24)
GFR calc Af Amer: >60 mL/min (ref >60)
GFR calc non Af Amer: >60 mL/min (ref >60)
Glucose, Bld: 108 mg/dL — ABNORMAL HIGH (ref 70–99)
Potassium: 3.7 mmol/L (ref 3.5–5.1)
Sodium: 139 mmol/L (ref 135–145)

## 2019-12-22 LAB — AMMONIA: Ammonia: 25 umol/L (ref 9–35)

## 2019-12-22 NOTE — ED Provider Notes (Signed)
[facility] Emergency Department Provider Note    First MD Initiated Contact with Patient 12/22/19 1103     (approximate)  I have reviewed the triage vital signs and the nursing notes.   HISTORY  Chief Complaint Altered Mental Status and Weakness    HPI Maurice Moses is a 74 y.o. male presents the ER for evaluation of altered mental status has been progressively worsening over the past several days.  Does have multiple stroke history typically able to ambulate with a walker now is unable to ambulate.  Did recently have his HCT decreased due to concern for low blood pressure causing his symptoms.  Patient unable to provide much additional history.  No report of any fevers cough or congestion.  No nausea or vomiting.    Past Medical History:  Diagnosis Date  . Cerebellar stroke ([place])   . Depression   . Hx of endoscopy 09/02/2012  . Hypertension   . IBS (irritable bowel syndrome)   . S/P colonoscopy 09/02/2012  . Stroke (cerebrum) ([place])   . Stroke ([place])   . 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 Hypertension Other   . Parkinson's disease Mother   . Stomach cancer Father   . Hypertension Father   . Parkinson's disease Brother    Past Surgical History:  Procedure Laterality Date  . LOOP RECORDER INSERTION     Patient Active Problem List   Diagnosis Date Noted  . Chronic fatigue 10/30/2019  . Abnormal EKG 09/25/2019  . Hyperlipidemia LDL goal <70 09/25/2019  . Essential hypertension 09/25/2019  . Aphasia 08/14/2019  . Recurrent strokes ([place]) 11/26/2016  . Cerebral infarction ([place]) 07/24/2015  . TIA (transient ischemic attack) 07/23/2015      Prior to Admission medications   Medication Sig Start Date End Date Taking? Authorizing Provider  ascorbic acid (VITAMIN C) 500 MG tablet Take 500 mg by mouth daily.   Yes [provider]  aspirin EC 81 MG EC tablet Take 1 tablet (81 mg total) by mouth  daily. 07/26/15  Yes Dustin Flock, MD  atorvastatin (LIPITOR) 40 MG tablet Take 1 tablet (40 mg total) by mouth daily at 6 PM. 07/26/15  Yes Dustin Flock, MD  cetirizine (ZYRTEC) 10 MG tablet Take 10 mg by mouth daily.   Yes [provider]  cholecalciferol (VITAMIN D) 1000 units tablet Take 2,000 Units by mouth daily.   Yes [provider]  clopidogrel (PLAVIX) 75 MG tablet Take 1 tablet (75 mg total) by mouth daily. 07/26/15  Yes Dustin Flock, MD  docusate sodium (COLACE) 100 MG capsule Take 300 mg by mouth daily.    Yes [provider]  escitalopram (LEXAPRO) 20 MG tablet Take 20 mg by mouth every evening.    Yes [provider]  esomeprazole (NEXIUM) 40 MG capsule Take 40 mg by mouth every evening.    Yes [provider]  hydrochlorothiazide (HYDRODIURIL) 25 MG tablet Take 25 mg by mouth daily.   Yes [provider]  HYDROcodone-acetaminophen (NORCO/VICODIN) 5-325 MG tablet Take 1-2 tablets by mouth every 4 (four) hours as needed for moderate pain (Max 7 per day). 07/26/15  Yes Dustin Flock, MD  mirtazapine (REMERON SOL-TAB) 30 MG disintegrating tablet Take 30 mg by mouth daily.    Yes [provider]  Omega-3 1000 MG CAPS Take 1,000 mg by mouth daily.   Yes [provider]  quinapril (ACCUPRIL) 40 MG tablet Take  40 mg by mouth at bedtime.   Yes [provider]    Allergies Morphine and related and Doxycycline    Social History Social History   Tobacco Use  . Smoking status: Former Smoker    Packs/day: 1.00    Years: 30.00    Pack years: 30.00    Types: Cigarettes    Quit date: 09/22/1977    Years since quitting: 42.2  . Smokeless tobacco: Never Used  Substance Use Topics  . Alcohol use: Yes    Alcohol/week: 2.0 standard drinks    Types: 2 Glasses of wine per week  . Drug use: No    Review of Systems Patient denies headaches, rhinorrhea, blurry vision, numbness, shortness of breath,  chest pain, edema, cough, abdominal pain, nausea, vomiting, diarrhea, dysuria, fevers, rashes or hallucinations unless otherwise stated above in HPI. ____________________________________________   PHYSICAL EXAM:  VITAL SIGNS: Vitals:   12/22/19 1300 12/22/19 1445  BP: (!) 106/56   Pulse: (!) 57 (!) 56  Resp: 12 13  Temp:    SpO2: 96% 97%    Constitutional: Alert, chronically ill appearing Eyes: Conjunctivae are normal.  Head: Atraumatic. Nose: No congestion/rhinnorhea. Mouth/Throat: Mucous membranes are moist.   Neck: No stridor. Painless ROM.  Cardiovascular: Normal rate, regular rhythm. Grossly normal heart sounds.  Good peripheral circulation. Respiratory: Normal respiratory effort.  No retractions. Lungs CTAB. Gastrointestinal: Soft and nontender. No distention. No abdominal bruits. No CVA tenderness. Genitourinary:  Musculoskeletal: No lower extremity tenderness nor edema.  No joint effusions. Neurologic:  Defers to wife to respond to questions.  No droop.  LUE drift Skin:  Skin is warm, dry and intact. No rash noted. Psychiatric: Mood and affect are normal. Speech and behavior are normal.  ____________________________________________   LABS (all labs ordered are listed, but only abnormal results are displayed)  Results for orders placed or performed during the hospital encounter of 12/22/19 (from the past 24 hour(s))  Basic metabolic panel     Status: Abnormal   Collection Time: 12/22/19 10:58 AM  Result Value Ref Range   Sodium 139 135 - 145 mmol/L   Potassium 3.7 3.5 - 5.1 mmol/L   Chloride 106 98 - 111 mmol/L   CO2 27 22 - 32 mmol/L   Glucose, Bld 108 (H) 70 - 99 mg/dL   BUN 20 8 - 23 mg/dL   Creatinine, Ser 3.71 0.61 - 1.24 mg/dL   Calcium 9.6 8.9 - 06.2 mg/dL   GFR calc non Af Amer >60 >60 mL/min   GFR calc Af Amer >60 >60 mL/min   Anion gap 6 5 - 15  CBC     Status: None   Collection Time: 12/22/19 10:58 AM  Result Value Ref Range   WBC 9.2 4.0 - 10.5  K/uL   RBC 4.94 4.22 - 5.81 MIL/uL   Hemoglobin 15.3 13.0 - 17.0 g/dL   HCT 69.4 85.4 - 62.7 %   MCV 90.7 80.0 - 100.0 fL   MCH 31.0 26.0 - 34.0 pg   MCHC 34.2 30.0 - 36.0 g/dL   RDW 03.5 00.9 - 38.1 %   Platelets 247 150 - 400 K/uL   nRBC 0.0 0.0 - 0.2 %  Brain natriuretic peptide     Status: None   Collection Time: 12/22/19 10:58 AM  Result Value Ref Range   B Natriuretic Peptide 45.0 0.0 - 100.0 pg/mL  Hepatic function panel     Status: None   Collection Time: 12/22/19 10:58 AM  Result Value Ref Range   Total Protein 6.8 6.5 - 8.1 g/dL   Albumin 4.0 3.5 - 5.0 g/dL   AST 34 15 - 41 U/L   ALT 35 0 - 44 U/L   Alkaline Phosphatase 82 38 - 126 U/L   Total Bilirubin 0.9 0.3 - 1.2 mg/dL   Bilirubin, Direct 0.2 0.0 - 0.2 mg/dL   Indirect Bilirubin 0.7 0.3 - 0.9 mg/dL  Protime-INR     Status: None   Collection Time: 12/22/19 10:58 AM  Result Value Ref Range   Prothrombin Time 12.8 11.4 - 15.2 seconds   INR 1.0 0.8 - 1.2  Urinalysis, Complete w Microscopic     Status: Abnormal   Collection Time: 12/22/19 11:23 AM  Result Value Ref Range   Color, Urine YELLOW (A) YELLOW   APPearance CLEAR (A) CLEAR   Specific Gravity, Urine 1.019 1.005 - 1.030   pH 5.0 5.0 - 8.0   Glucose, UA NEGATIVE NEGATIVE mg/dL   Hgb urine dipstick NEGATIVE NEGATIVE   Bilirubin Urine NEGATIVE NEGATIVE   Ketones, ur NEGATIVE NEGATIVE mg/dL   Protein, ur NEGATIVE NEGATIVE mg/dL   Nitrite NEGATIVE NEGATIVE   Leukocytes,Ua NEGATIVE NEGATIVE   RBC / HPF 0-5 0 - 5 RBC/hpf   WBC, UA 0-5 0 - 5 WBC/hpf   Bacteria, UA NONE SEEN NONE SEEN   Squamous Epithelial / LPF 0-5 0 - 5   Mucus PRESENT   Ammonia     Status: None   Collection Time: 12/22/19 11:23 AM  Result Value Ref Range   Ammonia 25 9 - 35 umol/L   ____________________________________________  EKG My review and personal interpretation at Time: 10:54   Indication: ams  Rate: 70  Rhythm: sinus Axis: normal Other: no stemi, nonspecific st  abn ____________________________________________  RADIOLOGY  I personally reviewed all radiographic images ordered to evaluate for the above acute complaints and reviewed radiology reports and findings.  These findings were personally discussed with the patient.  Please see medical record for radiology report.  ____________________________________________   PROCEDURES  Procedure(s) performed:  Procedures    Critical Care performed: no ____________________________________________   INITIAL IMPRESSION / ASSESSMENT AND PLAN / ED COURSE  Pertinent labs & imaging results that were available during my care of the patient were reviewed by me and considered in my medical decision making (see chart for details).   DDX: Dehydration, sepsis, pna, uti, hypoglycemia, cva, drug effect, withdrawal, encephalitis   Maurice Moses is a 74 y.o. who presents to the ED with presentation as described above.  Patient with extensive past medical history.  Does not have any focal deficits at this time he was reportedly more confused than baseline.  No recent medication changes no recent fevers.  Extensive work-up will include blood work telemetry monitoring, EKG urinalysis as well as neuro imaging.  Clinical Course as of Dec 21 1501  Tue Dec 22, 2019  1239 Discussed results of CT imaging with patient and family.  I did recommend admission to the hospital for further neuro work-up MRI and additional testing.  Patient family reluctant to come to the hospital saying that he gets more confused while in the hospital and prefer to just have MRI.   [PR]  1449 MRI appears stable.  No evidence of acute deficit.  Again recommended observation in the hospital patient and family stating that they do not want to be admitted or wait he would prefer to be discharged home.  They do consent to having case  management social work called to help arrange home health needs.  Have discussed with the patient and available family  all diagnostics and treatments performed thus far and all questions were answered to the best of my ability. The patient demonstrates understanding and agreement with plan.    [PR]    Clinical Course User Index [PR] Willy Eddy, MD    The patient was evaluated in Emergency Department today for the symptoms described in the history of present illness. He/she was evaluated in the context of the global COVID-19 pandemic, which necessitated consideration that the patient might be at risk for infection with the SARS-CoV-2 virus that causes COVID-19. Institutional protocols and algorithms that pertain to the evaluation of patients at risk for COVID-19 are in a state of rapid change based on information released by regulatory bodies including the CDC and federal and state organizations. These policies and algorithms were followed during the patient's care in the ED.  As part of my medical decision making, I reviewed the following data within the electronic MEDICAL RECORD NUMBER Nursing notes reviewed and incorporated, Labs reviewed, notes from prior ED visits and [place] Controlled Substance Database   ____________________________________________   FINAL CLINICAL IMPRESSION(S) / ED DIAGNOSES  Final diagnoses:  Weakness      NEW MEDICATIONS STARTED DURING THIS VISIT:  New Prescriptions   No medications on file     Note:  This document was prepared using Dragon voice recognition software and may include unintentional dictation errors.    Willy Eddy, MD 12/22/19 [phone]

## 2019-12-22 NOTE — ED Triage Notes (Signed)
PT here with increased AMS and weakness. Hx of strokes, last one on Christmas. Hx of dementia. Plavix and HTZ.

## 2019-12-23 ENCOUNTER — Telehealth: Payer: Self-pay

## 2019-12-23 NOTE — Telephone Encounter (Signed)
Spoke to wife, Amy Kroboth who was concerned because they were under the impression doctor was ordering aide services for patient at home. Writer discussed difference in PCS services and home health care. Wife states she does not need HHC services for nursing and PT but is in need of PCS services for someone to assist with ADL's several hours a day. Patient is prepared to pay privately. Writer provided numbers for local PCS services. Wife with no other questions/concerns.

## 2020-01-24 ENCOUNTER — Inpatient Hospital Stay
Admission: EM | Admit: 2020-01-24 | Discharge: 2020-01-28 | DRG: 871 | Disposition: A | Discharge status: Home Health | Payer: Medicare PPO | Attending: Internal Medicine | Admitting: Internal Medicine

## 2020-01-24 ENCOUNTER — Inpatient Hospital Stay: Payer: Medicare PPO

## 2020-01-24 ENCOUNTER — Emergency Department: Payer: Medicare PPO

## 2020-01-24 ENCOUNTER — Other Ambulatory Visit: Payer: Self-pay

## 2020-01-24 DIAGNOSIS — Z820 Family history of epilepsy and other diseases of the nervous system: Secondary | ICD-10-CM

## 2020-01-24 DIAGNOSIS — G8929 Other chronic pain: Secondary | ICD-10-CM | POA: Diagnosis present

## 2020-01-24 DIAGNOSIS — Z881 Allergy status to other antibiotic agents status: Secondary | ICD-10-CM | POA: Diagnosis not present

## 2020-01-24 DIAGNOSIS — J9601 Acute respiratory failure with hypoxia: Secondary | ICD-10-CM | POA: Diagnosis present

## 2020-01-24 DIAGNOSIS — J690 Pneumonitis due to inhalation of food and vomit: Secondary | ICD-10-CM | POA: Diagnosis not present

## 2020-01-24 DIAGNOSIS — K589 Irritable bowel syndrome without diarrhea: Secondary | ICD-10-CM | POA: Diagnosis present

## 2020-01-24 DIAGNOSIS — A419 Sepsis, unspecified organism: Principal | ICD-10-CM | POA: Diagnosis present

## 2020-01-24 DIAGNOSIS — Z8249 Family history of ischemic heart disease and other diseases of the circulatory system: Secondary | ICD-10-CM | POA: Diagnosis not present

## 2020-01-24 DIAGNOSIS — Z79899 Other long term (current) drug therapy: Secondary | ICD-10-CM

## 2020-01-24 DIAGNOSIS — G92 Toxic encephalopathy: Secondary | ICD-10-CM | POA: Diagnosis present

## 2020-01-24 DIAGNOSIS — J189 Pneumonia, unspecified organism: Secondary | ICD-10-CM | POA: Diagnosis not present

## 2020-01-24 DIAGNOSIS — G9341 Metabolic encephalopathy: Secondary | ICD-10-CM | POA: Diagnosis not present

## 2020-01-24 DIAGNOSIS — Z66 Do not resuscitate: Secondary | ICD-10-CM | POA: Diagnosis present

## 2020-01-24 DIAGNOSIS — F0392 Unspecified dementia, unspecified severity, with psychotic disturbance: Secondary | ICD-10-CM | POA: Diagnosis present

## 2020-01-24 DIAGNOSIS — Z20822 Contact with and (suspected) exposure to covid-19: Secondary | ICD-10-CM | POA: Diagnosis present

## 2020-01-24 DIAGNOSIS — Z885 Allergy status to narcotic agent status: Secondary | ICD-10-CM

## 2020-01-24 DIAGNOSIS — R6520 Severe sepsis without septic shock: Secondary | ICD-10-CM | POA: Diagnosis present

## 2020-01-24 DIAGNOSIS — I10 Essential (primary) hypertension: Secondary | ICD-10-CM | POA: Diagnosis not present

## 2020-01-24 DIAGNOSIS — I69322 Dysarthria following cerebral infarction: Secondary | ICD-10-CM

## 2020-01-24 DIAGNOSIS — I639 Cerebral infarction, unspecified: Secondary | ICD-10-CM | POA: Diagnosis present

## 2020-01-24 DIAGNOSIS — E785 Hyperlipidemia, unspecified: Secondary | ICD-10-CM | POA: Diagnosis not present

## 2020-01-24 DIAGNOSIS — Z87891 Personal history of nicotine dependence: Secondary | ICD-10-CM | POA: Diagnosis not present

## 2020-01-24 DIAGNOSIS — Z8679 Personal history of other diseases of the circulatory system: Secondary | ICD-10-CM

## 2020-01-24 DIAGNOSIS — F329 Major depressive disorder, single episode, unspecified: Secondary | ICD-10-CM | POA: Diagnosis present

## 2020-01-24 DIAGNOSIS — R112 Nausea with vomiting, unspecified: Secondary | ICD-10-CM | POA: Diagnosis present

## 2020-01-24 DIAGNOSIS — Z7902 Long term (current) use of antithrombotics/antiplatelets: Secondary | ICD-10-CM

## 2020-01-24 DIAGNOSIS — F0150 Vascular dementia without behavioral disturbance: Secondary | ICD-10-CM | POA: Diagnosis present

## 2020-01-24 DIAGNOSIS — E876 Hypokalemia: Secondary | ICD-10-CM | POA: Diagnosis not present

## 2020-01-24 DIAGNOSIS — K219 Gastro-esophageal reflux disease without esophagitis: Secondary | ICD-10-CM | POA: Diagnosis not present

## 2020-01-24 DIAGNOSIS — Z7982 Long term (current) use of aspirin: Secondary | ICD-10-CM

## 2020-01-24 DIAGNOSIS — N289 Disorder of kidney and ureter, unspecified: Secondary | ICD-10-CM | POA: Diagnosis present

## 2020-01-24 DIAGNOSIS — R509 Fever, unspecified: Secondary | ICD-10-CM | POA: Diagnosis present

## 2020-01-24 LAB — COMPREHENSIVE METABOLIC PANEL
ALT: 49 U/L — ABNORMAL HIGH (ref 0–44)
AST: 27 U/L (ref 15–41)
Albumin: 3.9 g/dL (ref 3.5–5.0)
Alkaline Phosphatase: 93 U/L (ref 38–126)
Anion gap: 10 (ref 5–15)
BUN: 22 mg/dL (ref 8–23)
CO2: 25 mmol/L (ref 22–32)
Calcium: 9.7 mg/dL (ref 8.9–10.3)
Chloride: 105 mmol/L (ref 98–111)
Creatinine, Ser: 1.01 mg/dL (ref 0.61–1.24)
GFR calc Af Amer: >60 mL/min (ref >60)
GFR calc non Af Amer: >60 mL/min (ref >60)
Glucose, Bld: 131 mg/dL — ABNORMAL HIGH (ref 70–99)
Potassium: 3.3 mmol/L — ABNORMAL LOW (ref 3.5–5.1)
Sodium: 140 mmol/L (ref 135–145)
Total Bilirubin: 1.3 mg/dL — ABNORMAL HIGH (ref 0.3–1.2)
Total Protein: 6.7 g/dL (ref 6.5–8.1)

## 2020-01-24 LAB — CBC WITH DIFFERENTIAL/PLATELET
Abs Immature Granulocytes: 0.05 10*3/uL (ref 0.00–0.07)
Basophils Absolute: 0.1 10*3/uL (ref 0.0–0.1)
Basophils Relative: 0 %
Eosinophils Absolute: 0.1 10*3/uL (ref 0.0–0.5)
Eosinophils Relative: 1 %
HCT: 43.6 % (ref 39.0–52.0)
Hemoglobin: 15.4 g/dL (ref 13.0–17.0)
Immature Granulocytes: 0 %
Lymphocytes Relative: 7 %
Lymphs Abs: 0.9 10*3/uL (ref 0.7–4.0)
MCH: 31.3 pg (ref 26.0–34.0)
MCHC: 35.3 g/dL (ref 30.0–36.0)
MCV: 88.6 fL (ref 80.0–100.0)
Monocytes Absolute: 0.7 10*3/uL (ref 0.1–1.0)
Monocytes Relative: 6 %
Neutro Abs: 10.6 10*3/uL — ABNORMAL HIGH (ref 1.7–7.7)
Neutrophils Relative %: 86 %
Platelets: 245 10*3/uL (ref 150–400)
RBC: 4.92 MIL/uL (ref 4.22–5.81)
RDW: 13.5 % (ref 11.5–15.5)
WBC: 12.3 10*3/uL — ABNORMAL HIGH (ref 4.0–10.5)
nRBC: 0 % (ref 0.0–0.2)

## 2020-01-24 LAB — PROTIME-INR
INR: 1 (ref 0.8–1.2)
Prothrombin Time: 13 seconds (ref 11.4–15.2)

## 2020-01-24 LAB — URINALYSIS, COMPLETE (UACMP) WITH MICROSCOPIC
Bacteria, UA: NONE SEEN
Bilirubin Urine: NEGATIVE
Glucose, UA: NEGATIVE mg/dL
Hgb urine dipstick: NEGATIVE
Ketones, ur: NEGATIVE mg/dL
Leukocytes,Ua: NEGATIVE
Nitrite: NEGATIVE
Protein, ur: NEGATIVE mg/dL
Specific Gravity, Urine: 1.024 (ref 1.005–1.030)
Squamous Epithelial / HPF: NONE SEEN (ref 0–5)
pH: 7 (ref 5.0–8.0)

## 2020-01-24 LAB — LACTIC ACID, PLASMA
Lactic Acid, Venous: 1.3 mmol/L (ref 0.5–1.9)
Lactic Acid, Venous: 1.4 mmol/L (ref 0.5–1.9)
Lactic Acid, Venous: 2.1 mmol/L (ref 0.5–1.9)

## 2020-01-24 LAB — SARS CORONAVIRUS 2 BY RT PCR (HOSPITAL ORDER, PERFORMED IN ~~LOC~~ HOSPITAL LAB): SARS Coronavirus 2: NEGATIVE

## 2020-01-24 LAB — BRAIN NATRIURETIC PEPTIDE: B Natriuretic Peptide: 92.1 pg/mL (ref 0.0–100.0)

## 2020-01-24 LAB — LIPASE, BLOOD: Lipase: 19 U/L (ref 11–51)

## 2020-01-24 MED ORDER — IOHEXOL 9 MG/ML PO SOLN
500.0000 mL | ORAL | Status: AC
  Administered 2020-01-24 (×2): 500 mL via ORAL

## 2020-01-24 MED ORDER — HYDRALAZINE HCL 20 MG/ML IJ SOLN
5.0000 mg | INTRAMUSCULAR | Status: DC | PRN
  Filled 2020-01-24: qty 0.25

## 2020-01-24 MED ORDER — IOHEXOL 300 MG/ML  SOLN
125.0000 mL | Freq: Once | INTRAMUSCULAR | Status: AC | PRN
  Administered 2020-01-24: 125 mL via INTRAVENOUS

## 2020-01-24 MED ORDER — DM-GUAIFENESIN ER 30-600 MG PO TB12
1.0000 | ORAL_TABLET | Freq: Two times a day (BID) | ORAL | Status: DC
  Administered 2020-01-24 – 2020-01-28 (×8): 1 via ORAL
  Filled 2020-01-24 (×10): qty 1

## 2020-01-24 MED ORDER — METRONIDAZOLE IN NACL 5-0.79 MG/ML-% IV SOLN
500.0000 mg | Freq: Three times a day (TID) | INTRAVENOUS | Status: DC
  Administered 2020-01-25 – 2020-01-27 (×6): 500 mg via INTRAVENOUS
  Filled 2020-01-24 (×10): qty 100

## 2020-01-24 MED ORDER — HYDROCODONE-ACETAMINOPHEN 5-325 MG PO TABS
1.0000 | ORAL_TABLET | ORAL | Status: DC | PRN
  Administered 2020-01-24 – 2020-01-28 (×7): 1 via ORAL
  Filled 2020-01-24 (×2): qty 1
  Filled 2020-01-24: qty 2
  Filled 2020-01-24 (×4): qty 1

## 2020-01-24 MED ORDER — ACETAMINOPHEN 325 MG PO TABS: 650.0000 mg | ORAL_TABLET | Freq: Four times a day (QID) | ORAL | Status: DC | PRN

## 2020-01-24 MED ORDER — PANTOPRAZOLE SODIUM 40 MG PO TBEC: 40.0000 mg | DELAYED_RELEASE_TABLET | Freq: Every day | ORAL | Status: DC

## 2020-01-24 MED ORDER — CLOPIDOGREL BISULFATE 75 MG PO TABS
75.0000 mg | ORAL_TABLET | Freq: Every day | ORAL | Status: DC
  Administered 2020-01-24 – 2020-01-28 (×6): 75 mg via ORAL
  Filled 2020-01-24 (×6): qty 1

## 2020-01-24 MED ORDER — MIRTAZAPINE 15 MG PO TBDP
30.0000 mg | ORAL_TABLET | Freq: Every day | ORAL | Status: DC
  Administered 2020-01-26 – 2020-01-28 (×3): 30 mg via ORAL
  Filled 2020-01-24 (×5): qty 2

## 2020-01-24 MED ORDER — ACETAMINOPHEN 650 MG RE SUPP
975.0000 mg | Freq: Once | RECTAL | Status: AC | PRN
  Administered 2020-01-24: 975 mg via RECTAL
  Filled 2020-01-24: qty 1

## 2020-01-24 MED ORDER — POTASSIUM CHLORIDE 20 MEQ/15ML (10%) PO SOLN
40.0000 meq | Freq: Once | ORAL | Status: DC
  Filled 2020-01-24: qty 30

## 2020-01-24 MED ORDER — VANCOMYCIN HCL IN DEXTROSE 1-5 GM/200ML-% IV SOLN
1000.0000 mg | Freq: Once | INTRAVENOUS | Status: AC
  Administered 2020-01-24: 1000 mg via INTRAVENOUS
  Filled 2020-01-24: qty 200

## 2020-01-24 MED ORDER — LORATADINE 10 MG PO TABS
10.0000 mg | ORAL_TABLET | Freq: Every day | ORAL | Status: DC
  Administered 2020-01-24 – 2020-01-28 (×5): 10 mg via ORAL
  Filled 2020-01-24 (×5): qty 1

## 2020-01-24 MED ORDER — SODIUM CHLORIDE 0.9 % IV SOLN
500.0000 mg | INTRAVENOUS | Status: DC
  Administered 2020-01-25 – 2020-01-26 (×3): 500 mg via INTRAVENOUS
  Filled 2020-01-24 (×4): qty 500

## 2020-01-24 MED ORDER — ASCORBIC ACID 500 MG PO TABS
500.0000 mg | ORAL_TABLET | Freq: Every day | ORAL | Status: DC
  Administered 2020-01-25 – 2020-01-28 (×4): 500 mg via ORAL
  Filled 2020-01-24 (×4): qty 1

## 2020-01-24 MED ORDER — ONDANSETRON HCL 4 MG/2ML IJ SOLN
4.0000 mg | Freq: Three times a day (TID) | INTRAMUSCULAR | Status: DC | PRN
  Administered 2020-01-28: 4 mg via INTRAVENOUS
  Filled 2020-01-24: qty 2

## 2020-01-24 MED ORDER — ENOXAPARIN SODIUM 40 MG/0.4ML ~~LOC~~ SOLN
40.0000 mg | SUBCUTANEOUS | Status: DC
  Administered 2020-01-25 – 2020-01-27 (×4): 40 mg via SUBCUTANEOUS
  Filled 2020-01-24 (×4): qty 0.4

## 2020-01-24 MED ORDER — METRONIDAZOLE IN NACL 5-0.79 MG/ML-% IV SOLN
500.0000 mg | Freq: Once | INTRAVENOUS | Status: AC
  Administered 2020-01-24: 500 mg via INTRAVENOUS
  Filled 2020-01-24: qty 100

## 2020-01-24 MED ORDER — DOCUSATE SODIUM 100 MG PO CAPS
300.0000 mg | ORAL_CAPSULE | Freq: Every day | ORAL | Status: DC
  Administered 2020-01-25 – 2020-01-28 (×4): 300 mg via ORAL
  Filled 2020-01-24 (×5): qty 3

## 2020-01-24 MED ORDER — OMEGA-3-ACID ETHYL ESTERS 1 G PO CAPS
1000.0000 mg | ORAL_CAPSULE | Freq: Every day | ORAL | Status: DC
  Filled 2020-01-24 (×2): qty 1

## 2020-01-24 MED ORDER — SODIUM CHLORIDE 0.9 % IV BOLUS
1000.0000 mL | Freq: Once | INTRAVENOUS | Status: AC
  Administered 2020-01-24: 1000 mL via INTRAVENOUS

## 2020-01-24 MED ORDER — ASPIRIN EC 81 MG PO TBEC
81.0000 mg | DELAYED_RELEASE_TABLET | Freq: Every day | ORAL | Status: DC
  Administered 2020-01-25 – 2020-01-28 (×5): 81 mg via ORAL
  Filled 2020-01-24 (×5): qty 1

## 2020-01-24 MED ORDER — SODIUM CHLORIDE 0.9 % IV SOLN
1.0000 g | INTRAVENOUS | Status: DC
  Administered 2020-01-25 – 2020-01-27 (×4): 1 g via INTRAVENOUS
  Filled 2020-01-24: qty 1
  Filled 2020-01-24: qty 10
  Filled 2020-01-24: qty 1
  Filled 2020-01-24 (×2): qty 10

## 2020-01-24 MED ORDER — SODIUM CHLORIDE 0.9 % IV SOLN: INTRAVENOUS | Status: DC

## 2020-01-24 MED ORDER — ALBUTEROL SULFATE (2.5 MG/3ML) 0.083% IN NEBU: 2.5000 mg | INHALATION_SOLUTION | RESPIRATORY_TRACT | Status: DC | PRN

## 2020-01-24 MED ORDER — ESCITALOPRAM OXALATE 10 MG PO TABS
20.0000 mg | ORAL_TABLET | Freq: Every evening | ORAL | Status: DC
  Administered 2020-01-24 – 2020-01-27 (×3): 20 mg via ORAL
  Filled 2020-01-24 (×5): qty 2

## 2020-01-24 MED ORDER — SODIUM CHLORIDE 0.9 % IV SOLN
2.0000 g | Freq: Once | INTRAVENOUS | Status: AC
  Administered 2020-01-24: 2 g via INTRAVENOUS
  Filled 2020-01-24: qty 2

## 2020-01-24 MED ORDER — VITAMIN D 25 MCG (1000 UNIT) PO TABS
2000.0000 [IU] | ORAL_TABLET | Freq: Every day | ORAL | Status: DC
  Administered 2020-01-24 – 2020-01-28 (×5): 2000 [IU] via ORAL
  Filled 2020-01-24 (×5): qty 2

## 2020-01-24 MED ORDER — ATORVASTATIN CALCIUM 20 MG PO TABS
40.0000 mg | ORAL_TABLET | Freq: Every day | ORAL | Status: DC
  Administered 2020-01-25 – 2020-01-27 (×2): 40 mg via ORAL
  Filled 2020-01-24 (×2): qty 2

## 2020-01-24 NOTE — ED Provider Notes (Signed)
[facility] Emergency Department Provider Note  Time seen: 2:10 PM  I have reviewed the triage vital signs and the nursing notes.   HISTORY  Chief Complaint Code Sepsis   HPI Maurice Moses is a 74 y.o. male with a past medical history of hypertension, CVA, presents to the emergency department with decreased responsiveness and fever.   According to EMS report patient has a CVA, baseline can occasionally speak.  Wife states the patient is not speaking at all today felt warm so he was brought to the emergency department for evaluation found to be febrile to 101.1.  Patient does have emesis on his shirt upon arrival.  Patient is not able to contribute to his history follow commands or contribute to review of systems.  Past Medical History:  Diagnosis Date  . Cerebellar stroke ([facility])   . Depression   . Hx of endoscopy 09/02/2012  . Hypertension   . IBS (irritable bowel syndrome)   . S/P colonoscopy 09/02/2012  . Stroke (cerebrum) ([facility])   . Stroke ([facility])   . Subdural hemorrhage [facility])     Patient Active Problem List   Diagnosis Date Noted  . Chronic fatigue 10/30/2019  . Abnormal EKG 09/25/2019  . Hyperlipidemia LDL goal <70 09/25/2019  . Essential hypertension 09/25/2019  . Aphasia 08/14/2019  . Recurrent strokes (HCC) 11/26/2016  . Cerebral infarction ([facility]) 07/24/2015  . TIA (transient ischemic attack) 07/23/2015    Past Surgical History:  Procedure Laterality Date  . LOOP RECORDER INSERTION      Prior to Admission medications   Medication Sig Start Date End Date Taking? Authorizing Provider  ascorbic acid (VITAMIN C) 500 MG tablet Take 500 mg by mouth daily.    [provider]  aspirin EC 81 MG EC tablet Take 1 tablet (81 mg total) by mouth daily. 07/26/15   Auburn Bilberry, MD  atorvastatin (LIPITOR) 40 MG tablet Take 1 tablet (40 mg total) by mouth daily at 6 PM. 07/26/15   Auburn Bilberry, MD  cetirizine (ZYRTEC) 10 MG tablet Take 10 mg by  mouth daily.    [provider]  cholecalciferol (VITAMIN D) 1000 units tablet Take 2,000 Units by mouth daily.    [provider]  clopidogrel (PLAVIX) 75 MG tablet Take 1 tablet (75 mg total) by mouth daily. 07/26/15   Auburn Bilberry, MD  docusate sodium (COLACE) 100 MG capsule Take 300 mg by mouth daily.     [provider]  escitalopram (LEXAPRO) 20 MG tablet Take 20 mg by mouth every evening.     [provider]  esomeprazole (NEXIUM) 40 MG capsule Take 40 mg by mouth every evening.     [provider]  hydrochlorothiazide (HYDRODIURIL) 25 MG tablet Take 25 mg by mouth daily.    [provider]  HYDROcodone-acetaminophen (NORCO/VICODIN) 5-325 MG tablet Take 1-2 tablets by mouth every 4 (four) hours as needed for moderate pain (Max 7 per day). 07/26/15   Auburn Bilberry, MD  mirtazapine (REMERON SOL-TAB) 30 MG disintegrating tablet Take 30 mg by mouth daily.     [provider]  Omega-3 1000 MG CAPS Take 1,000 mg by mouth daily.    [provider]  quinapril (ACCUPRIL) 40 MG tablet Take 40 mg by mouth at bedtime.    [provider]    Allergies  Allergen Reactions  . Morphine And Related Other (See Comments)    Agitation - wife does not want him to have it  .  Doxycycline Nausea And Vomiting    Family History  Problem Relation Age of Onset  . Hypertension Other   . Parkinson's disease Mother   . Stomach cancer Father   . Hypertension Father   . Parkinson's disease Brother     Social History Social History   Tobacco Use  . Smoking status: Former Smoker    Packs/day: 1.00    Years: 30.00    Pack years: 30.00    Types: Cigarettes    Quit date: 09/22/1977    Years since quitting: 42.3  . Smokeless tobacco: Never Used  Substance Use Topics  . Alcohol use: Yes    Alcohol/week: 2.0 standard drinks    Types: 2 Glasses of wine per week  . Drug use: No    Review of Systems Unable to perform an  adequate/accurate review of systems secondary to baseline altered mental status with increased altered mental status ____________________________________________   PHYSICAL EXAM:  VITAL SIGNS: ED Triage Vitals  Enc Vitals Group     BP 01/24/20 1405 (!) 186/91     Pulse Rate 01/24/20 1405 90     Resp 01/24/20 1405 (!) 25     Temp 01/24/20 1405 (!) 101.1 F (38.4 C)     Temp Source 01/24/20 1405 Oral     SpO2 01/24/20 1404 90 %     Weight 01/24/20 1405 (S) 246 lb 14.6 oz (112 kg)     Height 01/24/20 1405 (S) 5\' 10"  (1.778 m)     Head Circumference --      Peak Flow --      Pain Score --      Pain Loc --      Pain Edu? --      Excl. in [place]? --    Constitutional: Patient is somewhat somnolent does awaken to voice will look at you when speaking but does not follow commands or speak. Eyes: Normal exam ENT      Head: Normocephalic and atraumatic.      Mouth/Throat: Mucous membranes are moist. Cardiovascular: Normal rate, regular rhythm around 90 to 100 bpm. Respiratory: Normal respiratory effort without tachypnea nor retractions. Breath sounds are clear.  No obvious wheeze rales or rhonchi. Gastrointestinal: Soft and nontender. No distention.  Obese. Musculoskeletal: Mild lower extremity edema bilaterally Neurologic: Patient will open his eyes and look at you when you are speaking to him.  Does not follow commands does not move extremities unable to perform an adequate neurological examination. Skin:  Skin is warm, dry  ____________________________________________    EKG  EKG viewed and interpreted by myself shows a normal sinus rhythm at 95 bpm with a narrow QRS, left axis deviation, largely normal intervals with nonspecific ST changes.  ____________________________________________    RADIOLOGY  Chest x-ray shows left basilar atelectasis versus infiltrate.  ____________________________________________   INITIAL IMPRESSION / ASSESSMENT AND PLAN / ED COURSE  Pertinent  labs & imaging results that were available during my care of the patient were reviewed by me and considered in my medical decision making (see chart for details).   Patient presents to the emergency department for decreased responsiveness.  History of CVA, wife cares for the patient at home.  She states at baseline patient is somewhat responsive will occasionally speak.  Today he is somnolent less responsive not speaking and felt warm.  Patient found to have a fever on 1.1 in the emergency department.  Is satting in the 80s on room air with no baseline O2 requirement.  90% on 2 L.  Patient does have a small amount of emesis on his shirt.  We will check labs, chest x-ray, Covid swab.  We will start the patient on broad-spectrum antibiotics to cover for sepsis given his tachypnea to 25 hypoxia and fever.  We will dose IV fluids and continue to closely monitor while awaiting results.  Patient will require admission to the hospital for further treatment once his ER work-up is been completed.  Patient's x-ray shows a possible infiltrate.  This fits the patient's clinical picture of fever tachypnea and hypoxia.  We will cover with antibiotics.  Remainder the patient's work-up is largely within normal limits besides a slight leukocytosis.  We will admit to the hospitalist service for continued treatment.  Maurice Moses was evaluated in Emergency Department on 01/24/2020 for the symptoms described in the history of present illness. He was evaluated in the context of the global COVID-19 pandemic, which necessitated consideration that the patient might be at risk for infection with the SARS-CoV-2 virus that causes COVID-19. Institutional protocols and algorithms that pertain to the evaluation of patients at risk for COVID-19 are in a state of rapid change based on information released by regulatory bodies including the CDC and federal and state organizations. These policies and algorithms were followed during the  patient's care in the ED.  CRITICAL CARE Performed by: Minna Antis   Total critical care time: 30 minutes  Critical care time was exclusive of separately billable procedures and treating other patients.  Critical care was necessary to treat or prevent imminent or life-threatening deterioration.  Critical care was time spent personally by me on the following activities: development of treatment plan with patient and/or surrogate as well as nursing, discussions with consultants, evaluation of patient's response to treatment, examination of patient, obtaining history from patient or surrogate, ordering and performing treatments and interventions, ordering and review of laboratory studies, ordering and review of radiographic studies, pulse oximetry and re-evaluation of patient's condition.  ____________________________________________   FINAL CLINICAL IMPRESSION(S) / ED DIAGNOSES  Sepsis Pneumonia   Minna Antis, MD 01/24/20 1520

## 2020-01-24 NOTE — Consult Note (Signed)
PHARMACY -  BRIEF ANTIBIOTIC NOTE   Pharmacy has received consult(s) for cefepime and vancomycin from an ED provider.  The patient's profile has been reviewed for ht/wt/allergies/indication/available labs.    One time order(s) placed for Cefepime 2g IV and Vancomycin 1g IV x 1 dose. Will order an additional vancomycin 1g IV for a total loading dose of 2g.   Further antibiotics/pharmacy consults should be ordered by admitting physician if indicated.                       Thank you, Gardner Candle, PharmD, BCPS Clinical Pharmacist 01/24/2020 2:24 PM

## 2020-01-24 NOTE — H&P (Addendum)
History and Physical    Maurice Moses QJJ:[id number] DOB: 1946-04-05 DOA: 01/24/2020  Referring MD/NP/PA:   PCP: Josephina Gip, MD   Patient coming from:  The patient is coming from home.  At baseline, pt is independent for most of ADL.        Chief Complaint: cough, fever, altered mental status  HPI: Maurice Moses is a 74 y.o. male with medical history significant of hypertension, hyperlipidemia, stroke with difficulty speaking, GERD, depression, SDH 10/13/2015, IBS, who presents with cough, fever, altered mental status.  Per his wife, patient had history of stroke which affects his speech, but pt usually can talk a little bit. Since yesterday, patient became confused and stopped talking.  Patient has cough, does not seem to have shortness of breath and chest pain. He has fever and chills.  He has nausea and vomited twice, no diarrhea, not sure if he has abdominal pain.  Not sure if patient has symptoms of UTI.  ED Course: pt was found to have WBC 12.3, pending COVID-19 PCR, negative urinalysis, lactic acid 1.3, INR 1.0, pending lipase, potassium 3.3, renal function okay, temperature one 1.9, blood pressure 148/82, heart rate 84, tachypnea, oxygen saturation 90% on room air, and 94% on 4 L nasal cannula oxygen.  Chest x-ray showed possible left lower lobe infiltration.  Patient is admitted to MedSurg bed as inpatient.  Review of Systems: Could not be reviewed due to altered mental status and patient is not talking.  Allergy:  Allergies  Allergen Reactions  . Morphine And Related Other (See Comments)    Agitation - wife does not want him to have it  . Doxycycline Nausea And Vomiting    Past Medical History:  Diagnosis Date  . Cerebellar stroke (HCC)   . Depression   . Hx of endoscopy 09/02/2012  . Hypertension   . IBS (irritable bowel syndrome)   . S/P colonoscopy 09/02/2012  . Stroke (cerebrum) (HCC)   . Stroke (HCC)   . Subdural hemorrhage [facility])     Past Surgical History:   Procedure Laterality Date  . LOOP RECORDER INSERTION      Social History:  reports that he quit smoking about 42 years ago. His smoking use included cigarettes. He has a 30.00 pack-year smoking history. He has never used smokeless tobacco. He reports current alcohol use of about 2.0 standard drinks of alcohol per week. He reports that he does not use drugs.  Family History:  Family History  Problem Relation Age of Onset  . Hypertension Other   . Parkinson's disease Mother   . Stomach cancer Father   . Hypertension Father   . Parkinson's disease Brother      Prior to Admission medications   Medication Sig Start Date End Date Taking? Authorizing Provider  ascorbic acid (VITAMIN C) 500 MG tablet Take 500 mg by mouth daily.    [provider]  aspirin EC 81 MG EC tablet Take 1 tablet (81 mg total) by mouth daily. 07/26/15   Auburn Bilberry, MD  atorvastatin (LIPITOR) 40 MG tablet Take 1 tablet (40 mg total) by mouth daily at 6 PM. 07/26/15   Auburn Bilberry, MD  cetirizine (ZYRTEC) 10 MG tablet Take 10 mg by mouth daily.    [provider]  cholecalciferol (VITAMIN D) 1000 units tablet Take 2,000 Units by mouth daily.    [provider]  clopidogrel (PLAVIX) 75 MG tablet Take 1 tablet (75 mg total) by mouth daily. 07/26/15   Auburn Bilberry,  MD  docusate sodium (COLACE) 100 MG capsule Take 300 mg by mouth daily.     [provider]  escitalopram (LEXAPRO) 20 MG tablet Take 20 mg by mouth every evening.     [provider]  esomeprazole (NEXIUM) 40 MG capsule Take 40 mg by mouth every evening.     [provider]  hydrochlorothiazide (HYDRODIURIL) 25 MG tablet Take 25 mg by mouth daily.    [provider]  HYDROcodone-acetaminophen (NORCO/VICODIN) 5-325 MG tablet Take 1-2 tablets by mouth every 4 (four) hours as needed for moderate pain (Max 7 per day). 07/26/15   Auburn Bilberry, MD  mirtazapine (REMERON SOL-TAB) 30 MG  disintegrating tablet Take 30 mg by mouth daily.     [provider]  Omega-3 1000 MG CAPS Take 1,000 mg by mouth daily.    [provider]  quinapril (ACCUPRIL) 40 MG tablet Take 40 mg by mouth at bedtime.    [provider]    Physical Exam: Vitals:   01/24/20 1600 01/24/20 1615 01/24/20 1630 01/24/20 1645  BP: (!) 157/92 (!) 154/82 (!) 161/84   Pulse: 85 88 85   Resp: 16 19 20    Temp:    98.5 F (36.9 C)  TempSrc:    Oral  SpO2: 96% 95% 96%   Weight:      Height:       General: Not in acute distress HEENT:       Eyes: PERRL, EOMI, no scleral icterus.       ENT: No discharge from the ears and nose, no pharynx injection, no tonsillar enlargement.        Neck: No JVD, no bruit, no mass felt. Heme: No neck lymph node enlargement. Cardiac: S1/S2, RRR, No murmurs, No gallops or rubs. Respiratory: No rales, wheezing, rhonchi or rubs. GI: distended, nontender, no organomegaly, BS present. GU: No hematuria Ext: has trace leg edema bilaterally. 1+DP/PT pulse bilaterally. Musculoskeletal: No joint deformities, No joint redness or warmth, no limitation of ROM in spin. Skin: No rashes.  Neuro: pt is confused, does not talk, cranial nerves II-XII grossly intact, moves all extremities.  Psych: Patient is not psychotic, no suicidal or hemocidal ideation.  Labs on Admission: I have personally reviewed following labs and imaging studies   CBC: Recent Labs  Lab 01/24/20 1355  WBC 12.3*  NEUTROABS 10.6*  HGB 15.4  HCT 43.6  MCV 88.6  PLT 245   Basic Metabolic Panel: Recent Labs  Lab 01/24/20 1355  NA 140  K 3.3*  CL 105  CO2 25  GLUCOSE 131*  BUN 22  CREATININE 1.01  CALCIUM 9.7   GFR: Estimated Creatinine Clearance: 79.2 mL/min (by C-G formula based on SCr of 1.01 mg/dL). Liver Function Tests: Recent Labs  Lab 01/24/20 1355  AST 27  ALT 49*  ALKPHOS 93  BILITOT 1.3*  PROT 6.7  ALBUMIN 3.9   No results for input(s): LIPASE, AMYLASE  in the last 168 hours. No results for input(s): AMMONIA in the last 168 hours. Coagulation Profile: Recent Labs  Lab 01/24/20 1355  INR 1.0   Cardiac Enzymes: No results for input(s): CKTOTAL, CKMB, CKMBINDEX, TROPONINI in the last 168 hours. BNP (last 3 results) No results for input(s): PROBNP in the last 8760 hours. HbA1C: No results for input(s): HGBA1C in the last 72 hours. CBG: No results for input(s): GLUCAP in the last 168 hours. Lipid Profile: No results for input(s): CHOL, HDL, LDLCALC, TRIG, CHOLHDL, LDLDIRECT in the last  72 hours. Thyroid Function Tests: No results for input(s): TSH, T4TOTAL, FREET4, T3FREE, THYROIDAB in the last 72 hours. Anemia Panel: No results for input(s): VITAMINB12, FOLATE, FERRITIN, TIBC, IRON, RETICCTPCT in the last 72 hours. Urine analysis:    Component Value Date/Time   COLORURINE YELLOW (A) 01/24/2020 1416   APPEARANCEUR CLEAR (A) 01/24/2020 1416   LABSPEC 1.024 01/24/2020 1416   PHURINE 7.0 01/24/2020 1416   GLUCOSEU NEGATIVE 01/24/2020 1416   HGBUR NEGATIVE 01/24/2020 1416   BILIRUBINUR NEGATIVE 01/24/2020 1416   KETONESUR NEGATIVE 01/24/2020 1416   PROTEINUR NEGATIVE 01/24/2020 1416   NITRITE NEGATIVE 01/24/2020 1416   LEUKOCYTESUR NEGATIVE 01/24/2020 1416   Sepsis Labs: @LABRCNTIP (procalcitonin:4,lacticidven:4) ) Recent Results (from the past 240 hour(s))  SARS Coronavirus 2 by RT PCR (hospital order, performed in [facility] hospital lab) Nasopharyngeal Urine, Catheterized     Status: None   Collection Time: 01/24/20  2:16 PM   Specimen: Urine, Catheterized; Nasopharyngeal  Result Value Ref Range Status   SARS Coronavirus 2 NEGATIVE NEGATIVE Final    Comment: (NOTE) SARS-CoV-2 target nucleic acids are NOT DETECTED. The SARS-CoV-2 RNA is generally detectable in upper and lower respiratory specimens during the acute phase of infection. The lowest concentration of SARS-CoV-2 viral copies this assay can detect is 250 copies /  mL. A negative result does not preclude SARS-CoV-2 infection and should not be used as the sole basis for treatment or other patient management decisions.  A negative result may occur with improper specimen collection / handling, submission of specimen other than nasopharyngeal swab, presence of viral mutation(s) within the areas targeted by this assay, and inadequate number of viral copies (<250 copies / mL). A negative result must be combined with clinical observations, patient history, and epidemiological information. Fact Sheet for Patients:   03/25/20 Fact Sheet for Healthcare Providers: BoilerBrush.com.cy This test is not yet approved or cleared  by the https://pope.com/ FDA and has been authorized for detection and/or diagnosis of SARS-CoV-2 by FDA under an Emergency Use Authorization (EUA).  This EUA will remain in effect (meaning this test can be used) for the duration of the COVID-19 declaration under Section 564(b)(1) of the Act, 21 U.S.C. section 360bbb-3(b)(1), unless the authorization is terminated or revoked sooner. Performed at [facility], [address], [place], [place]      Radiological Exams on Admission: DG Chest [facility] 1 View  Result Date: 01/24/2020 CLINICAL DATA:  Fever and vomiting. EXAM: PORTABLE CHEST 1 VIEW COMPARISON:  Dec 22, 2019 FINDINGS: Decreased lung volumes are seen which is likely secondary to the degree of patient inspiration. Very mild atelectasis and/or early infiltrate is seen within the left lung base. The heart size and mediastinal contours are within normal limits. Degenerative changes seen within the thoracic spine. IMPRESSION: Very mild left basilar atelectasis and/or early infiltrate. Electronically Signed   By: Dec 24, 2019 M.D.   On: 01/24/2020 15:04     EKG: Independently reviewed.  Sinus rhythm, QTC 439, LAE, LAD, low voltage, poor R wave  progression  Assessment/Plan Principal Problem:   Sepsis (HCC) Active Problems:   Hyperlipidemia LDL goal <70   Essential hypertension   Stroke (HCC)   GERD (gastroesophageal reflux disease)   Hypokalemia   CAP (community acquired pneumonia)   Aspiration pneumonia (HCC)   Acute metabolic encephalopathy   Nausea & vomiting   Acute respiratory failure with hypoxia (HCC)   Acute respiratory failure with hypoxia due to CAP and possible aspiration pneumonia and sepsis due  to pneumonia: Patient meets critical for sepsis with leukocytosis, fever, tachypnea.  Lactic acid is normal, currently hemodynamically stable.  - Will admit to med-surg bed as inpt - IV Rocephin, azithromycin, Flagyl (patient received 1 dose of vancomycin, Flagyl and cefepime in ED) - Mucinex for cough  - Bronchodilators - Urine legionella and S. pneumococcal antigen - Follow up blood culture x2, sputum culture - will get Procalcitonin and trend lactic acid level per sepsis protocol - IVF: 2L of NS bolus in ED, followed by 100 mL per hour of NS  - keep NPO until SLP is done  Hyperlipidemia LDL goal <70 -lipitor  Essential hypertension -Hold HCTZ and quinapril since patient is at risk of developing hypotension due to sepsis -IV hydralazine as needed  Stroke (HCC) -Aspirin, Plavix, Lipitor  GERD (gastroesophageal reflux disease) -Pepcid (pt cannot take protonix, and was taking Nexium at home)  Hypokalemia: K= 3.3 on admission. - Repleted - Check Mg level  Acute metabolic encephalopathy: Likely due to sepsis -Frequent neuro check  Nausea & vomiting: Etiology is not clear, may be due to sepsis and pneumonia, but patient cannot talk, on examination he has abdominal distention. -We will check lipase -Follow-up CT scan of abdomen/pelvis -As needed Zofran     DVT ppx: SQ Lovenox Code Status: DNR per his wife Family Communication:   Yes, patient's wife at bed side Disposition Plan:  Anticipate  discharge back to previous environment Consults called:  none Admission status: Med-surg bed as inpt       Status is: Inpatient  Remains inpatient appropriate because:Inpatient level of care appropriate due to severity of illness.  Patient has multiple comorbidities, now presents with acute respiratory failure with hypoxia due to CAP and possible aspiration pneumonia.  Patient also has sepsis and altered mental status.  His presentation is highly complicated.  Patient is at high risk of deteriorating.  Need to be treated in hospital for at least 2 days   Dispo: The patient is from: Home              Anticipated d/c is to: Home              Anticipated d/c date is: 2 days              Patient currently is not medically stable to d/c.           Date of Service 01/24/2020    Ivor Costa [facility]   If 7PM-7AM, please contact night-coverage [email or web address] 01/24/2020, 5:14 PM

## 2020-01-24 NOTE — Progress Notes (Signed)
CODE SEPSIS - PHARMACY COMMUNICATION  **Broad Spectrum Antibiotics should be administered within 1 hour of Sepsis diagnosis**  Time Code Sepsis Called/Page Received: 1417  Antibiotics Ordered: Vancomycin, metronidazole, cefepime  Time of 1st antibiotic administration: 14:54 - Cefepime   Martyn Malay ,PharmD Clinical Pharmacist  01/24/2020  3:34 PM

## 2020-01-24 NOTE — ED Triage Notes (Signed)
Patient arrived via EMS from home, wife takes care of patient. Patient baseline is being able to speak somewhat however patient is not doing that. Patient is running a fever, did vomit 2x with EMS in route. Patient does not ambulate.   Patient is "a little off from baseline" per wife. Patient does have Hx of stroke affecting one side and speech.

## 2020-01-24 NOTE — Progress Notes (Signed)
Notified bedside nurse of need to order repeat lactic acid.  

## 2020-01-24 NOTE — ED Notes (Signed)
Assigned bed @ [id number], spoke with RN Tom.

## 2020-01-25 DIAGNOSIS — G9341 Metabolic encephalopathy: Secondary | ICD-10-CM

## 2020-01-25 DIAGNOSIS — J9601 Acute respiratory failure with hypoxia: Secondary | ICD-10-CM

## 2020-01-25 LAB — BASIC METABOLIC PANEL
Anion gap: 4 — ABNORMAL LOW (ref 5–15)
BUN: 19 mg/dL (ref 8–23)
CO2: 27 mmol/L (ref 22–32)
Calcium: 8.7 mg/dL — ABNORMAL LOW (ref 8.9–10.3)
Chloride: 108 mmol/L (ref 98–111)
Creatinine, Ser: 1.08 mg/dL (ref 0.61–1.24)
GFR calc Af Amer: >60 mL/min (ref >60)
GFR calc non Af Amer: >60 mL/min (ref >60)
Glucose, Bld: 111 mg/dL — ABNORMAL HIGH (ref 70–99)
Potassium: 3.9 mmol/L (ref 3.5–5.1)
Sodium: 139 mmol/L (ref 135–145)

## 2020-01-25 LAB — URINE CULTURE: Culture: NO GROWTH

## 2020-01-25 LAB — CBC
HCT: 38 % — ABNORMAL LOW (ref 39.0–52.0)
Hemoglobin: 12.8 g/dL — ABNORMAL LOW (ref 13.0–17.0)
MCH: 31 pg (ref 26.0–34.0)
MCHC: 33.7 g/dL (ref 30.0–36.0)
MCV: 92 fL (ref 80.0–100.0)
Platelets: 196 10*3/uL (ref 150–400)
RBC: 4.13 MIL/uL — ABNORMAL LOW (ref 4.22–5.81)
RDW: 13.9 % (ref 11.5–15.5)
WBC: 10.4 10*3/uL (ref 4.0–10.5)
nRBC: 0 % (ref 0.0–0.2)

## 2020-01-25 LAB — MAGNESIUM: Magnesium: 1.7 mg/dL (ref 1.7–2.4)

## 2020-01-25 LAB — HIV ANTIBODY (ROUTINE TESTING W REFLEX): HIV Screen 4th Generation wRfx: NONREACTIVE

## 2020-01-25 LAB — STREP PNEUMONIAE URINARY ANTIGEN: Strep Pneumo Urinary Antigen: NEGATIVE

## 2020-01-25 MED ORDER — FAMOTIDINE 20 MG PO TABS
20.0000 mg | ORAL_TABLET | Freq: Every day | ORAL | Status: DC
  Administered 2020-01-25 – 2020-01-28 (×4): 20 mg via ORAL
  Filled 2020-01-25 (×4): qty 1

## 2020-01-25 NOTE — TOC Initial Note (Signed)
Transition of Care [facility]) - Initial/Assessment Note    Patient Details  Name: Maurice Moses MRN: [id number] Date of Birth: August 01, 1946  [facility]) CM/SW Contact:    Su Hilt, RN Phone Number: 01/25/2020, 2:52 PM  Clinical Narrative:                  Met with the patient and his spouse in the room  He lives at home with his wife He has had more than one stroke He had paid care at home for 8 hours a day He has a rolling walker and an adjustable bed at home He uses diapers for incontinence I reviewed the option of going to rehab skilled Nursing or Home health for PT and OT He was in rehab at [facility] previously and refused to stay more than 10 days He has used Home health previously and his wife stated that she would get the name of the agency She stated that the patient is at times very verbally mean, I asked if he was physically abusive and she said that no her wasn't I encouraged her to take the time that they have paid help to leave the house or spend some quality tie for herself to recharge her batteries, she stated that she does do that She stated that they have long term care insurance and has an $88 dollar a day benefit for a home aide however it does not kick in until after 100 days, they have only been using home aid for a little over 2 weeks. She and the patient plans for him to return home with the paid help that they have plus Home health for [facility] PT and OT    Expected Discharge Plan: [place] Barriers to Discharge: Continued Medical Work up   Patient Goals and CMS Choice        Expected Discharge Plan and Services Expected Discharge Plan: [place]   Discharge Planning Services: CM Consult   Living arrangements for the past 2 months: Single Family Home                 DME Arranged: N/A                    Prior Living Arrangements/Services Living arrangements for the past 2 months: Single Family  Home Lives with:: Spouse Patient language and need for interpreter reviewed:: Yes Do you feel safe going back to the place where you live?: Yes      Need for Family Participation in Patient Care: No (Comment) Care giver support system in place?: Yes (comment) Current home services: DME(adjustable bed, rolling walker) Criminal Activity/Legal Involvement Pertinent to Current Situation/Hospitalization: No - Comment as needed  Activities of Daily Living      Permission Sought/Granted   Permission granted to share information with : Yes, Verbal Permission Granted              Emotional Assessment Appearance:: Appears stated age Attitude/Demeanor/Rapport: Engaged Affect (typically observed): Appropriate Orientation: : Oriented to Situation, Oriented to  Time, Oriented to Place, Oriented to Self Alcohol / Substance Use: Not Applicable Psych Involvement: No (comment)  Admission diagnosis:  CAP (community acquired pneumonia) [J18.9] Community acquired pneumonia of left lower lobe of lung [J18.9] Sepsis, due to unspecified organism, unspecified whether acute organ dysfunction present [facility]) [A41.9] Patient Active Problem List   Diagnosis Date Noted  . Sepsis ([place]) 01/24/2020  . Stroke ([place]) 01/24/2020  .  Acute metabolic encephalopathy 35/39/1225  . Nausea & vomiting 01/24/2020  . Acute respiratory failure with hypoxia ([place]) 01/24/2020  . GERD (gastroesophageal reflux disease)   . Hypokalemia   . CAP (community acquired pneumonia)   . Aspiration pneumonia ([place])   . Chronic fatigue 10/30/2019  . Abnormal EKG 09/25/2019  . Hyperlipidemia LDL goal <70 09/25/2019  . Essential hypertension 09/25/2019  . Aphasia 08/14/2019  . Recurrent strokes ([place]) 11/26/2016  . Cerebral infarction ([place]) 07/24/2015  . TIA (transient ischemic attack) 07/23/2015   PCP:  Linfors, Kathie Rhodes, MD Pharmacy:   [place], [address]  [place] [address] Phone: [phone] Fax: [phone]  [facility] [place], [address] [place] [address] Phone: [phone] Fax: [phone]     Social Determinants of Health (SDOH) Interventions    Readmission Risk Interventions No flowsheet data found.

## 2020-01-25 NOTE — Progress Notes (Signed)
PROGRESS NOTE    Maurice Moses  IPJ:[id number] DOB: Jul 30, 1946 DOA: 01/24/2020 PCP: Josephina Gip, MD   Assessment & Plan:   Principal Problem:   Acute respiratory failure with hypoxia (HCC) Active Problems:   Hyperlipidemia LDL goal <70   Essential hypertension   Sepsis (HCC)   Stroke (HCC)   GERD (gastroesophageal reflux disease)   Hypokalemia   CAP (community acquired pneumonia)   Aspiration pneumonia (HCC)   Acute metabolic encephalopathy   Nausea & vomiting   Acute hypoxic respiratory failure: secondary to CAP and possible aspiration pneumonia. Continue on IV rocephin, azithromycin, flagyl. Legionella, strep pending. Blood cxs pending. Continue on bronchodilators. Continue on supplemental oxygen and wean as tolerated  Sepsis: secondary to pneumonia. Continue on IV abxs. Management as stated above  Pneumonia: CAP and/or aspiration. Continue on IV rocephin, azithromycin & flagyl. Dysphagia diet as per speech. Continue on bronchodilators. Continue on supplemental oxygen and wean as tolerated. Encourage incentive spirometry  Hyperlipidemia: continue on statin   Essential hypertension: hold HCTZ and ACE-I secondary to sepsis   Hx of stroke: continue on aspirin, plavix, lipitor  GERD: continue on pepcid. Unable to tolerate protonix   Hypokalemia: WNL today. Will continue to monitor   Acute metabolic encephalopathy: likely due to sepsis. Continue w/ supportive care  Memory problems: no formal dx of dementia. Will continue to monitor   Exophytic lesion of left kidney: incidental finding on CT. Rads recs renal MRI on an elective basis    DVT prophylaxis: lovenox Code Status: DNR Family Communication:  Disposition Plan: likely will d/c SNF. Unlikely any barriers   Consultants:      Procedures:    Antimicrobials: rocephin, azithromycin and flagyl   Subjective: Pt c/o shortness of breath.   Objective: Vitals:   01/24/20 2304 01/24/20 2306  01/25/20 0355 01/25/20 0724  BP: (!) 156/75  (!) 146/67 (!) 154/72  Pulse: 73  (!) 59 64  Resp:   18 17  Temp: 100 F (37.8 C)  98.6 F (37 C) 99.7 F (37.6 C)  TempSrc: Oral  Oral Oral  SpO2: 96%  98% 99%  Weight:  107.8 kg    Height:        Intake/Output Summary (Last 24 hours) at 01/25/2020 0745 Last data filed at 01/25/2020 0500 Gross per 24 hour  Intake 3048.1 ml  Output --  Net 3048.1 ml   Filed Weights   01/24/20 1405 01/24/20 2306  Weight: (S) 112 kg 107.8 kg    Examination:  General exam: Appears calm and comfortable  Respiratory system: course breath sounds b/l Cardiovascular system: S1 & S2 +. No rubs, gallops or clicks.  Gastrointestinal system: Abdomen is nondistended, soft and nontender.  Normal bowel sounds heard. Central nervous system: Alert and oriented to self only  Psychiatry: Judgement and insight appear abnormal. Flat mood and affect.     Data Reviewed: I have personally reviewed following labs and imaging studies  CBC: Recent Labs  Lab 01/24/20 1355 01/25/20 0416  WBC 12.3* 10.4  NEUTROABS 10.6*  --   HGB 15.4 12.8*  HCT 43.6 38.0*  MCV 88.6 92.0  PLT 245 196   Basic Metabolic Panel: Recent Labs  Lab 01/24/20 1355 01/25/20 0416  NA 140 139  K 3.3* 3.9  CL 105 108  CO2 25 27  GLUCOSE 131* 111*  BUN 22 19  CREATININE 1.01 1.08  CALCIUM 9.7 8.7*  MG  --  1.7   GFR: Estimated Creatinine Clearance: 72.6 mL/min (by C-G  formula based on SCr of 1.08 mg/dL). Liver Function Tests: Recent Labs  Lab 01/24/20 1355  AST 27  ALT 49*  ALKPHOS 93  BILITOT 1.3*  PROT 6.7  ALBUMIN 3.9   Recent Labs  Lab 01/24/20 1355  LIPASE 19   No results for input(s): AMMONIA in the last 168 hours. Coagulation Profile: Recent Labs  Lab 01/24/20 1355  INR 1.0   Cardiac Enzymes: No results for input(s): CKTOTAL, CKMB, CKMBINDEX, TROPONINI in the last 168 hours. BNP (last 3 results) No results for input(s): PROBNP in the last 8760  hours. HbA1C: No results for input(s): HGBA1C in the last 72 hours. CBG: No results for input(s): GLUCAP in the last 168 hours. Lipid Profile: No results for input(s): CHOL, HDL, LDLCALC, TRIG, CHOLHDL, LDLDIRECT in the last 72 hours. Thyroid Function Tests: No results for input(s): TSH, T4TOTAL, FREET4, T3FREE, THYROIDAB in the last 72 hours. Anemia Panel: No results for input(s): VITAMINB12, FOLATE, FERRITIN, TIBC, IRON, RETICCTPCT in the last 72 hours. Sepsis Labs: Recent Labs  Lab 01/24/20 1355 01/24/20 1836 01/24/20 2334  LATICACIDVEN 1.4 2.1* 1.3    Recent Results (from the past 240 hour(s))  Culture, blood (Routine x 2)     Status: None (Preliminary result)   Collection Time: 01/24/20  1:50 PM   Specimen: BLOOD  Result Value Ref Range Status   Specimen Description BLOOD L FOREARM  Final   Special Requests   Final    BOTTLES DRAWN AEROBIC AND ANAEROBIC Blood Culture results may not be optimal due to an excessive volume of blood received in culture bottles   Culture   Final    NO GROWTH < 24 HOURS Performed at [facility], [address], [place], [place] [address]    Report Status PENDING  Incomplete  Culture, blood (Routine x 2)     Status: None (Preliminary result)   Collection Time: 01/24/20  1:54 PM   Specimen: BLOOD  Result Value Ref Range Status   Specimen Description BLOOD R HAND  Final   Special Requests   Final    BOTTLES DRAWN AEROBIC AND ANAEROBIC Blood Culture adequate volume   Culture   Final    NO GROWTH < 24 HOURS Performed at [facility], [address] [place], [place] [address]    Report Status PENDING  Incomplete  SARS Coronavirus 2 by RT PCR (hospital order, performed in [facility] hospital lab) Nasopharyngeal Urine, Catheterized     Status: None   Collection Time: 01/24/20  2:16 PM   Specimen: Urine, Catheterized; Nasopharyngeal  Result Value Ref Range Status   SARS Coronavirus 2 NEGATIVE NEGATIVE Final    Comment:  (NOTE) SARS-CoV-2 target nucleic acids are NOT DETECTED. The SARS-CoV-2 RNA is generally detectable in upper and lower respiratory specimens during the acute phase of infection. The lowest concentration of SARS-CoV-2 viral copies this assay can detect is 250 copies / mL. A negative result does not preclude SARS-CoV-2 infection and should not be used as the sole basis for treatment or other patient management decisions.  A negative result may occur with improper specimen collection / handling, submission of specimen other than nasopharyngeal swab, presence of viral mutation(s) within the areas targeted by this assay, and inadequate number of viral copies (<250 copies / mL). A negative result must be combined with clinical observations, patient history, and epidemiological information. Fact Sheet for Patients:   BoilerBrush.com.cy Fact Sheet for Healthcare Providers: https://pope.com/ This test is not yet approved or cleared  by the [place] and has been authorized for detection and/or diagnosis of SARS-CoV-2 by FDA under an Emergency Use Authorization (EUA).  This EUA will remain in effect (meaning this test can be used) for the duration of the COVID-19 declaration under Section 564(b)(1) of the Act, 21 U.S.C. section 360bbb-3(b)(1), unless the authorization is terminated or revoked sooner. Performed at [facility], [address] [place], [place] [address]          Radiology Studies: CT ABDOMEN PELVIS W CONTRAST  Result Date: 01/24/2020 CLINICAL DATA:  Nausea and vomiting.  Fever. EXAM: CT ABDOMEN AND PELVIS WITH CONTRAST TECHNIQUE: Multidetector CT imaging of the abdomen and pelvis was performed using the standard protocol following bolus administration of intravenous contrast. CONTRAST:  17mL OMNIPAQUE IOHEXOL 300 MG/ML  SOLN COMPARISON:  Remote abdominopelvic CT 10/02/2010 FINDINGS: Lower chest: Motion  artifact. Majority of the chest is included in the field of view. Dependent atelectasis, right greater than left. Upper normal heart size. There are coronary artery calcifications. No pleural fluid. Mild wall thickening of the distal esophagus. Hepatobiliary: Prominent liver spanning 20 cm cranial caudal. Mild hepatic steatosis. No evidence of focal lesion allowing for limitations related to mild motion artifact. Gallbladder physiologically distended, no calcified stone. No biliary dilatation. Pancreas: Fatty atrophy.  No ductal dilatation or inflammation. Spleen: Normal in size without focal abnormality. Adrenals/Urinary Tract: Normal adrenal glands. No hydronephrosis. Homogeneous renal enhancement. Minimal bilateral perinephric edema. Mild cortical scarring in the lower right kidney. There is an exophytic 10 mm indeterminate lesion from the lower left kidney that is isodense to adjacent renal parenchyma, series 2, image 55. urinary bladder is partially distended. No bladder wall thickening. Stomach/Bowel: Mild distal esophageal wall thickening with tiny hiatal hernia. Nondistended stomach. No obvious gastric wall thickening. Normal positioning of the duodenum and ligament of Treitz. No obstruction, administered enteric contrast reaches the colon. There is no small bowel inflammation. Terminal ileum is normal. Appendix appears normal, series 5, image 43. No appendicitis. Colonic diverticulosis involving the descending and sigmoid colon. No diverticulitis. Moderate stool in the proximal colon. No colonic wall thickening or inflammation. No abnormal rectal distention. Vascular/Lymphatic: Normal caliber abdominal aorta. Portal vein is patent. No adenopathy. Reproductive: Prostate is unremarkable. Other: Moderate and small left fat containing inguinal hernias. Tiny fat containing umbilical hernia. No free air, free fluid, or intra-abdominal fluid collection. Musculoskeletal: There are no acute or suspicious osseous  abnormalities. IMPRESSION: 1. Mild distal esophageal wall thickening with tiny hiatal hernia, can be seen with reflux or esophagitis. 2. No other acute abnormality in the abdomen/pelvis. 3. Colonic diverticulosis without diverticulitis. 4. Indeterminate 10 mm exophytic lesion from the lower left kidney. Recommend nonemergent renal protocol MRI for characterization on an elective basis, when patient is able to tolerate breath hold technique. 5. Bilateral fat containing inguinal hernias. Tiny fat containing umbilical hernia. Aortic Atherosclerosis (ICD10-I70.0). Electronically Signed   By: Keith Rake M.D.   On: 01/24/2020 19:48   DG Chest Port 1 View  Result Date: 01/24/2020 CLINICAL DATA:  Fever and vomiting. EXAM: PORTABLE CHEST 1 VIEW COMPARISON:  Dec 22, 2019 FINDINGS: Decreased lung volumes are seen which is likely secondary to the degree of patient inspiration. Very mild atelectasis and/or early infiltrate is seen within the left lung base. The heart size and mediastinal contours are within normal limits. Degenerative changes seen within the thoracic spine. IMPRESSION: Very mild left basilar atelectasis and/or early infiltrate. Electronically Signed   By: Joyce Gross.D.  On: 01/24/2020 15:04        Scheduled Meds: . ascorbic acid  500 mg Oral Daily  . aspirin EC  81 mg Oral Daily  . atorvastatin  40 mg Oral q1800  . cholecalciferol  2,000 Units Oral Daily  . clopidogrel  75 mg Oral Daily  . dextromethorphan-guaiFENesin  1 tablet Oral BID  . docusate sodium  300 mg Oral Daily  . enoxaparin (LOVENOX) injection  40 mg Subcutaneous Q24H  . escitalopram  20 mg Oral QPM  . famotidine  20 mg Oral Daily  . loratadine  10 mg Oral Daily  . mirtazapine  30 mg Oral Daily  . potassium chloride  40 mEq Oral Once   Continuous Infusions: . sodium chloride 100 mL/hr at 01/24/20 2315  . azithromycin 500 mg (01/25/20 0144)  . cefTRIAXone (ROCEPHIN)  IV 1 g (01/25/20 0055)  . metronidazole  500 mg (01/25/20 0348)     LOS: 1 day    Time spent: 33 mins     Charise Killian, MD [facility] Pager [phone]  If 7PM-7AM, please contact night-coverage [email or web address] 01/25/2020, 7:45 AM

## 2020-01-25 NOTE — Evaluation (Signed)
Clinical/Bedside Swallow Evaluation Patient Details  Name: Maurice Moses MRN: [id number] Date of Birth: 15-Aug-1946  Today's Date: 01/25/2020 Time: SLP Start Time (ACUTE ONLY): 0630 SLP Stop Time (ACUTE ONLY): 0946 SLP Time Calculation (min) (ACUTE ONLY): 41 min  Past Medical History:  Past Medical History:  Diagnosis Date  . Cerebellar stroke ([place])   . Depression   . Hx of endoscopy 09/02/2012  . Hypertension   . IBS (irritable bowel syndrome)   . S/P colonoscopy 09/02/2012  . Stroke (cerebrum) ([place])   . Stroke ([place])   . 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 LOOP RECORDER INSERTION     HPI:  Maurice Moses is a 74 y.o. male with medical history significant of hypertension, hyperlipidemia, stroke with difficulty speaking, GERD, depression, SDH 10/13/2015, IBS, who presents with cough, fever, altered mental status. Chest x-ray showed possible left lower lobe infiltration. ST consult for evaluation of any dysphagia or aspiration.    Assessment / Plan / Recommendation Clinical Impression  Pt presents with adequate oropharyngeal ablities when consuming regular textures, puree and thin liquids via straw. Per nursing and pt's wife report pt had pills in his mouth after attempting to swallow them. In the setting of adequate oropharyngeal abilities, this oral holding is likely attributed to pt's cognitive deficits. Education provided on strategies to compensate for pt's cognition such as offering applesauce to aid with oral clearing. Education also provided on reducing textures of food to reduce cognitive load as well as aspiration precautions. Given the severity of pt's cognitive deficits, recommend conservative diet of dysphagia 2 with thin liquids, medicine whole in applesauce or crushed in puree if needed. All questions answered to pt and his wife satisfaction. They agree with currently recommended diet and no further ST  services are indicated. SLP Visit Diagnosis: Dysphagia, unspecified (R13.10)    Aspiration Risk  Mild aspiration risk    Diet Recommendation   Dysphagia 2, thin liquids via straw  Medication Administration: Whole meds with puree    Other  Recommendations Oral Care Recommendations: Oral care BID   Follow up Recommendations None      Frequency and Duration   N/A         Prognosis   N/A     Swallow Study   General Date of Onset: 01/25/20 HPI: Maurice Moses is a 74 y.o. male with medical history significant of hypertension, hyperlipidemia, stroke with difficulty speaking, GERD, depression, SDH 10/13/2015, IBS, who presents with cough, fever, altered mental status. Chest x-ray showed possible left lower lobe infiltration. ST consult for evaluation of any dysphagia or aspiration.  Type of Study: Bedside Swallow Evaluation Previous Swallow Assessment: none in chart Diet Prior to this Study: NPO Temperature Spikes Noted: No Respiratory Status: Nasal cannula History of Recent Intubation: No Behavior/Cognition: Alert;Pleasant mood;Confused;Distractible Oral Cavity Assessment: Within Functional Limits Oral Care Completed by SLP: No Oral Cavity - Dentition: Adequate natural dentition Self-Feeding Abilities: Needs assist Patient Positioning: Upright in bed Baseline Vocal Quality: Normal Volitional Cough: Strong Volitional Swallow: Able to elicit    Oral/Motor/Sensory Function Overall Oral Motor/Sensory Function: Within functional limits   Ice Chips Ice chips: Not tested   Thin Liquid Thin Liquid: Within functional limits Presentation: Straw;Cup    Nectar Thick Nectar Thick Liquid: Within functional limits Presentation: Cup;Spoon   Honey Thick Honey Thick Liquid: Not tested   Puree Puree: Within functional limits Presentation: Spoon   Solid     Solid:  Within functional limits     Hobie Kohles B. Dreama Saa M.S., CCC-SLP, [facility] Speech-Language Pathologist Rehabilitation  Services Office [phone]  Iliana Hutt Dreama Saa 01/25/2020,1:43 PM

## 2020-01-26 LAB — LEGIONELLA PNEUMOPHILA SEROGP 1 UR AG: L. pneumophila Serogp 1 Ur Ag: NEGATIVE

## 2020-01-26 MED ORDER — LORAZEPAM 2 MG/ML IJ SOLN
1.0000 mg | Freq: Once | INTRAMUSCULAR | Status: AC
  Administered 2020-01-26: 1 mg via INTRAVENOUS
  Filled 2020-01-26: qty 1

## 2020-01-26 MED ORDER — LORAZEPAM 2 MG/ML IJ SOLN
1.0000 mg | Freq: Once | INTRAMUSCULAR | Status: DC
  Administered 2020-01-26: 1 mg via INTRAVENOUS
  Filled 2020-01-26: qty 1

## 2020-01-26 MED ORDER — LISINOPRIL 20 MG PO TABS
40.0000 mg | ORAL_TABLET | Freq: Every day | ORAL | Status: DC
  Administered 2020-01-27 – 2020-01-28 (×3): 40 mg via ORAL
  Filled 2020-01-26 (×4): qty 2

## 2020-01-26 MED ORDER — LORAZEPAM 2 MG/ML IJ SOLN
1.0000 mg | Freq: Once | INTRAMUSCULAR | Status: AC
  Administered 2020-01-26: 1 mg via INTRAMUSCULAR

## 2020-01-26 MED ORDER — HYDROCHLOROTHIAZIDE 12.5 MG PO CAPS
12.5000 mg | ORAL_CAPSULE | Freq: Every day | ORAL | Status: DC
  Administered 2020-01-27: 12.5 mg via ORAL
  Filled 2020-01-26 (×2): qty 1
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
## 2020-01-26 NOTE — TOC Progression Note (Signed)
Transition of Care [facility]) - Progression Note    Patient Details  Name: Maurice Moses MRN: [id number] Date of Birth: [birth year]  [facility] CM/SW Contact  Barrie Dunker, RN Phone Number: 01/26/2020, 3:09 PM  Clinical Narrative:     Lavina Hamman has made a bed offer as long as insurance approves, I called [employer] and started insurance auth request  Ref number [id number]  Faxed clinical to [facility] [phone]  Expected Discharge Plan: Home w Home Health Services Barriers to Discharge: Continued Medical Work up  Expected Discharge Plan and Services Expected Discharge Plan: Home w Home Health Services   Discharge Planning Services: CM Consult   Living arrangements for the past 2 months: Single Family Home                 DME Arranged: N/A                     Social Determinants of Health (SDOH) Interventions    Readmission Risk Interventions No flowsheet data found.

## 2020-01-26 NOTE — Evaluation (Signed)
Occupational Therapy Evaluation Patient Details Name: Maurice Moses MRN: [id number] DOB: Mar 02, 1946 Today's Date: 01/26/2020    History of Present Illness 74 y.o. male with medical history significant of hypertension, hyperlipidemia, stroke with difficulty speaking, GERD, depression, SDH 10/13/2015, IBS, who presents with cough, fever, altered mental status. Pt admitted with acute hypoxic respiratory failure secondary to CAP and possible aspiration pneumonia.   Clinical Impression   Pt was seen for OT evaluation this date. No family present during session. Home Aide present for portion of session but reports she has not worked with him before and is only filling in for this caregiver today and tomorrow. Pt lives with his wife in a 2 story home. Additional details regarding DME and assist levels/PLOF limited. PCA reports she was notified that pt requires significant assist for bathing, dressing, toileting, and mobility aspects (+2 assist with RW and w/c follow - unclear what +2 truly means). Pt alert and oriented only to self upon arrival. Attends to therapist more on L side versus R. Very easily distracted requiring Max cues to redirect to task. Significant processing time required and motor planning deficits noted. BUE strength 5/5 but motor planning deficits limit pt's ability to self feed or drink requiring extensive time and cues. Ultimately unsuccessful in getting coffee mug to mouth but was able to take bite of pureed pineapple with Min A and additional time and cues. SLP notified re: need for additional assist for meals. Difficulty with initiating and terminating tasks. Pt declines OOB attempts this date. Pt reports ambulating with rollator at baseline and no assist required for ADL tasks. However, chart review and PCA report otherwise. Pt would benefit from skilled OT to address noted impairments and functional limitations (see below for any additional details) in order to maximize safety and  independence while minimizing falls risk and caregiver burden. Upon hospital discharge, recommend STR to maximize pt safety and return to PLOF.     Follow Up Recommendations  SNF    Equipment Recommendations  None recommended by OT    Recommendations for Other Services       Precautions / Restrictions Precautions Precautions: Fall Restrictions Weight Bearing Restrictions: No      Mobility Bed Mobility               General bed mobility comments: deferred 2/2 cognition and pt adamantly declining  Transfers                 General transfer comment: deferred 2/2 cognition and pt adamantly declining    Balance                                           ADL either performed or assessed with clinical judgement   ADL Overall ADL's : Needs assistance/impaired Eating/Feeding: Set up;Cueing for sequencing;Bed level;Minimal assistance Eating/Feeding Details (indicate cue type and reason): Max increased time, minimizing distractions, and max verbal cues to initiate bringing loaded spoon to mouth requiring additional cues for redirecting to task and completing, ultimately able to take small bite of pureed pineapple but unable to bring coffee mug to his mouth Grooming: Moderate assistance   Upper Body Bathing: Maximal assistance   Lower Body Bathing: Maximal assistance   Upper Body Dressing : Maximal assistance   Lower Body Dressing: Maximal assistance     Toilet Transfer Details (indicate cue type and reason): unsafe to attempt  Vision Baseline Vision/History: Wears glasses Wears Glasses: At all times Patient Visual Report: No change from baseline       Perception     Praxis      Pertinent Vitals/Pain Pain Assessment: No/denies pain     Hand Dominance Right   Extremity/Trunk Assessment Upper Extremity Assessment Upper Extremity Assessment: Difficult to assess due to impaired cognition;Overall [facility] for tasks  assessed(grossly 5/5 bilaterally, very poor motor planning with intentional movements or when prompted)   Lower Extremity Assessment Lower Extremity Assessment: Generalized weakness;Difficult to assess due to impaired cognition(poor motor planning)       Communication Communication Communication: Expressive difficulties(significantly increased time for processing)   Cognition Arousal/Alertness: Awake/alert Behavior During Therapy: Flat affect Overall Cognitive Status: History of cognitive impairments - at baseline                                 General Comments: Pt oriented to self, not to DOB/time, place, or situation. Significantly increased time for processing, motor planning, and is very easily distracted. Requires max time and cues for 1 step commands and familiar tasks   General Comments       Exercises Other Exercises Other Exercises: Pt/PCA present educated in how to support pt's participation in tasks   Shoulder Instructions      Home Living Family/patient expects to be discharged to:: Private residence Living Arrangements: Spouse/significant other Available Help at Discharge: Family;Available 24 hours/day;Personal care attendant;Available PRN/intermittently(Per case mgt note, PCA 8 hrs/day) Type of Home: House Home Access: Ramped entrance     Home Layout: Two level;Able to live on main level with bedroom/bathroom               Home Equipment: Wheelchair - manual   Additional Comments: Pt unreliable historian, reports having rollator; chart indicates 2WW, no family present to verify      Prior Functioning/Environment Level of Independence: Needs assistance  Gait / Transfers Assistance Needed: pt unreliable historian - reports ambulating with rollator with no help, per PCA present he needs +2 assist with RW and w/c follow ADL's / Homemaking Assistance Needed: pt unreliable historian - reports he doesn't need assist for ADL but PCA reports pt  requires significant assist for sponge bath, dressing, and toileting, using diapers 2/2 incontinence            OT Problem List: Decreased strength;Decreased coordination;Decreased cognition;Decreased safety awareness;Decreased knowledge of use of DME or AE;Impaired UE functional use;Impaired balance (sitting and/or standing)      OT Treatment/Interventions: Self-care/ADL training;Therapeutic activities;Therapeutic exercise;Neuromuscular education;Cognitive remediation/compensation;DME and/or AE instruction;Patient/family education;Balance training    OT Goals(Current goals can be found in the care plan section) Acute Rehab OT Goals Patient Stated Goal: pt unable to state OT Goal Formulation: Patient unable to participate in goal setting Time For Goal Achievement: 02/09/20 Potential to Achieve Goals: Good ADL Goals Pt Will Perform Eating: with set-up;sitting;with min assist(supported sitting, VC to initiate, sustain, and complete tasks, Min A as needed) Pt Will Perform Grooming: sitting;with min assist;with set-up(supported sitting, set up and Min A as needed with cues for sequencing, initiating, and completing) Pt Will Transfer to Toilet: bedside commode;stand pivot transfer;with mod assist(LRAD for amb, VC for sequencing/motor planning, initiation)  OT Frequency: Min 1X/week   Barriers to D/C:            Co-evaluation              AM-PAC OT "6  Clicks" Daily Activity     Outcome Measure Help from another person eating meals?: A Little Help from another person taking care of personal grooming?: A Little Help from another person toileting, which includes using toliet, bedpan, or urinal?: A Lot Help from another person bathing (including washing, rinsing, drying)?: A Lot Help from another person to put on and taking off regular upper body clothing?: A Lot Help from another person to put on and taking off regular lower body clothing?: A Lot 6 Click Score: 14   End of Session  Nurse Communication: Other (comment)(additional assist for all meals)  Activity Tolerance: Patient tolerated treatment well Patient left: in bed;with call bell/phone within reach;with bed alarm set;with nursing/sitter in room  OT Visit Diagnosis: Other abnormalities of gait and mobility (R26.89);Muscle weakness (generalized) (M62.81);Other symptoms and signs involving cognitive function                Time: 9371-6967 OT Time Calculation (min): 36 min Charges:  OT General Charges $OT Visit: 1 Visit OT Evaluation $OT Eval High Complexity: 1 High OT Treatments $Self Care/Home Management : 8-22 mins  Richrd Prime, MPH, MS, OTR/L ascom [phone] 01/26/20, 10:26 AM

## 2020-01-26 NOTE — [place] FL2 (Signed)
[place] MEDICAID FL2 LEVEL OF CARE SCREENING TOOL     IDENTIFICATION  Patient Name: Maurice Moses Birthdate: 05-28-1946 Sex: male Admission Date (Current Location): 01/24/2020  [place] and [place] Number:  Chiropodist and Address:  [facility], [address], [place], [place] [address]      Provider Number: [id number]  Attending Physician Name and Address:  Charise Killian, MD  Relative Name and Phone Number:  Amoret wife [phone]    Current Level of Care: Hospital Recommended Level of Care: Skilled Nursing Facility Prior Approval Number:    Date Approved/Denied:   PASRR Number: pending  Discharge Plan: SNF    Current Diagnoses: Patient Active Problem List   Diagnosis Date Noted  . Sepsis (HCC) 01/24/2020  . Stroke (HCC) 01/24/2020  . Acute metabolic encephalopathy 01/24/2020  . Nausea & vomiting 01/24/2020  . Acute respiratory failure with hypoxia (HCC) 01/24/2020  . GERD (gastroesophageal reflux disease)   . Hypokalemia   . CAP (community acquired pneumonia)   . Aspiration pneumonia (HCC)   . Chronic fatigue 10/30/2019  . Abnormal EKG 09/25/2019  . Hyperlipidemia LDL goal <70 09/25/2019  . Essential hypertension 09/25/2019  . Aphasia 08/14/2019  . Recurrent strokes (HCC) 11/26/2016  . Cerebral infarction (HCC) 07/24/2015  . TIA (transient ischemic attack) 07/23/2015    Orientation RESPIRATION BLADDER Height & Weight     Self, Time, Situation, Place  Normal External catheter Weight: 107.8 kg Height:  (S) 5\' 10"  (177.8 cm)  BEHAVIORAL SYMPTOMS/MOOD NEUROLOGICAL BOWEL NUTRITION STATUS      Incontinent    AMBULATORY STATUS COMMUNICATION OF NEEDS Skin   Extensive Assist Verbally Normal                       Personal Care Assistance Level of Assistance  Bathing, Dressing Bathing Assistance: Limited assistance   Dressing Assistance: Limited assistance     Functional Limitations Info              SPECIAL CARE FACTORS FREQUENCY  PT (By licensed PT), OT (By licensed OT)     PT Frequency: 5 times a week OT Frequency: 5 times per week            Contractures Contractures Info: Not present    Additional Factors Info  Code Status, Allergies Code Status Info: DNR Allergies Info: Morphine  Doxycycline           Current Medications (01/26/2020):  This is the current hospital active medication list Current Facility-Administered Medications  Medication Dose Route Frequency Provider Last Rate Last Admin  . acetaminophen (TYLENOL) tablet 650 mg  650 mg Oral Q6H PRN 03/27/2020, MD      . albuterol (PROVENTIL) (2.5 MG/3ML) 0.083% nebulizer solution 2.5 mg  2.5 mg Nebulization Q4H PRN Lorretta Harp, MD      . ascorbic acid (VITAMIN C) tablet 500 mg  500 mg Oral Daily Lorretta Harp, MD   500 mg at 01/26/20 1011  . aspirin EC tablet 81 mg  81 mg Oral Daily 03/27/20, MD   81 mg at 01/26/20 1011  . atorvastatin (LIPITOR) tablet 40 mg  40 mg Oral q1800 03/27/20, MD   40 mg at 01/25/20 1737  . azithromycin (ZITHROMAX) 500 mg in sodium chloride 0.9 % 250 mL IVPB  500 mg Intravenous Q24H 03/26/20, MD 250 mL/hr at 01/25/20 2240 500 mg at 01/25/20 2240  . cefTRIAXone (ROCEPHIN) 1 g in sodium chloride 0.9 %  100 mL IVPB  1 g Intravenous Q24H Ivor Costa, MD   Stopped at 01/25/20 2236  . cholecalciferol (VITAMIN D3) tablet 2,000 Units  2,000 Units Oral Daily Ivor Costa, MD   2,000 Units at 01/26/20 1011  . clopidogrel (PLAVIX) tablet 75 mg  75 mg Oral Daily Ivor Costa, MD   75 mg at 01/26/20 1011  . dextromethorphan-guaiFENesin (MUCINEX DM) 30-600 MG per 12 hr tablet 1 tablet  1 tablet Oral BID Ivor Costa, MD   1 tablet at 01/26/20 1121  . docusate sodium (COLACE) capsule 300 mg  300 mg Oral Daily Ivor Costa, MD   300 mg at 01/26/20 1012  . enoxaparin (LOVENOX) injection 40 mg  40 mg Subcutaneous Q24H Ivor Costa, MD   40 mg at 01/25/20 2235  . escitalopram (LEXAPRO) tablet 20 mg  20 mg Oral QPM  Ivor Costa, MD   20 mg at 01/25/20 1737  . famotidine (PEPCID) tablet 20 mg  20 mg Oral Daily Ivor Costa, MD   20 mg at 01/26/20 1011  . hydrALAZINE (APRESOLINE) injection 5 mg  5 mg Intravenous Q2H PRN Ivor Costa, MD      . hydrochlorothiazide (MICROZIDE) capsule 12.5 mg  12.5 mg Oral Daily Wyvonnia Dusky, MD      . HYDROcodone-acetaminophen (NORCO/VICODIN) 5-325 MG per tablet 1-2 tablet  1-2 tablet Oral Q4H PRN Ivor Costa, MD   1 tablet at 01/25/20 2235  . lisinopril (ZESTRIL) tablet 40 mg  40 mg Oral Daily Wyvonnia Dusky, MD      . loratadine (CLARITIN) tablet 10 mg  10 mg Oral Daily Ivor Costa, MD   10 mg at 01/26/20 1011  . metroNIDAZOLE (FLAGYL) IVPB 500 mg  500 mg Intravenous Cleophas Dunker, MD 100 mL/hr at 01/26/20 1022 500 mg at 01/26/20 1022  . mirtazapine (REMERON SOL-TAB) disintegrating tablet 30 mg  30 mg Oral Daily Ivor Costa, MD   30 mg at 01/26/20 1011  . ondansetron (ZOFRAN) injection 4 mg  4 mg Intravenous Q8H PRN Ivor Costa, MD      . potassium chloride 20 MEQ/15ML (10%) solution 40 mEq  40 mEq Oral Once Ivor Costa, MD   Stopped at 01/24/20 1817     Discharge Medications: Please see discharge summary for a list of discharge medications.  Relevant Imaging Results:  Relevant Lab Results:   Additional Information SS# [id number]  Su Hilt, RN

## 2020-01-26 NOTE — Progress Notes (Signed)
PROGRESS NOTE    Maurice Moses  JSH:[id number] DOB: [birth year] DOA: 01/24/2020 PCP: Josephina Gip, MD   HPI was taken from Dr. Clyde Lundborg: Maurice Moses is a 74 y.o. male with medical history significant of hypertension, hyperlipidemia, stroke with difficulty speaking, GERD, depression, SDH 10/13/2015, IBS, who presents with cough, fever, altered mental status.  Per his wife, patient had history of stroke which affects his speech, but pt usually can talk a little bit. Since yesterday, patient became confused and stopped talking.  Patient has cough, does not seem to have shortness of breath and chest pain. He has fever and chills.  He has nausea and vomited twice, no diarrhea, not sure if he has abdominal pain.  Not sure if patient has symptoms of UTI.  ED Course: pt was found to have WBC 12.3, pending COVID-19 PCR, negative urinalysis, lactic acid 1.3, INR 1.0, pending lipase, potassium 3.3, renal function okay, temperature one 1.9, blood pressure 148/82, heart rate 84, tachypnea, oxygen saturation 90% on room air, and 94% on 4 L nasal cannula oxygen.  Chest x-ray showed possible left lower lobe infiltration.  Patient is admitted to MedSurg bed as inpatient.  Assessment & Plan:   Principal Problem:   Acute respiratory failure with hypoxia (HCC) Active Problems:   Hyperlipidemia LDL goal <70   Essential hypertension   Sepsis (HCC)   Stroke (HCC)   GERD (gastroesophageal reflux disease)   Hypokalemia   CAP (community acquired pneumonia)   Aspiration pneumonia (HCC)   Acute metabolic encephalopathy   Nausea & vomiting   Acute hypoxic respiratory failure: secondary to CAP and possible aspiration pneumonia. Continue on IV rocephin, azithromycin, flagyl. Legionella, strep are both neg. Blood cxs NGTD. Continue on bronchodilators. Continue on supplemental oxygen and wean as tolerated  Sepsis: secondary to pneumonia. Continue on IV abxs. Management as stated above  Pneumonia: CAP and/or  aspiration. Continue on IV rocephin, azithromycin & flagyl. Dysphagia diet as per speech. Continue on bronchodilators. Continue on supplemental oxygen and wean as tolerated. Encourage incentive spirometry  Hyperlipidemia: continue on statin   Essential hypertension: restart home dose of HCTZ and ACE-I   Hx of stroke: continue on aspirin, plavix, lipitor  GERD: continue on pepcid. Unable to tolerate protonix   Hypokalemia: WNL today. Will continue to monitor   Acute metabolic encephalopathy: likely due to sepsis. Continue w/ supportive care  Memory problems: no formal dx of dementia but likely present. Will continue to monitor   Exophytic lesion of left kidney: incidental finding on CT. Rads recs renal MRI on an elective basis   Generalized weakness: PT/OT recs SNF   DVT prophylaxis: lovenox Code Status: DNR Family Communication:  Disposition Plan: likely will d/c SNF but pt refuses to go but pt is encephalopathic vs delirious vs dementia. Will apparently go home w/ increased aide cover as per pt's wife   Consultants:      Procedures:    Antimicrobials: rocephin, azithromycin and flagyl   Subjective: Pt c/o shortness of breath still. Pt is oriented to person & year only    Objective: Vitals:   01/25/20 0724 01/25/20 1139 01/25/20 1607 01/25/20 2213  BP: (!) 154/72 140/62 (!) 154/75 (!) 158/77  Pulse: 64 65 (!) 59 (!) 59  Resp: 17 17 16 15   Temp: 99.7 F (37.6 C) 97.9 F (36.6 C) 98.7 F (37.1 C) 98.3 F (36.8 C)  TempSrc: Oral Oral Oral Oral  SpO2: 99% 100% 99% 98%  Weight:      Height:  Intake/Output Summary (Last 24 hours) at 01/26/2020 0752 Last data filed at 01/25/2020 1600 Gross per 24 hour  Intake 1156.21 ml  Output --  Net 1156.21 ml   Filed Weights   01/24/20 1405 01/24/20 2306  Weight: (S) 112 kg 107.8 kg    Examination:  General exam: Appears calm and comfortable  Respiratory system: course breath sounds b/l Cardiovascular  system: S1 & S2 +. No rubs, gallops or clicks.  Gastrointestinal system: Abdomen is obese, soft and nontender. Hypoactive bowel sounds heard. Central nervous system: Alert and oriented to self & year only  Psychiatry: Judgement and insight appear abnormal. Flat mood and affect.     Data Reviewed: I have personally reviewed following labs and imaging studies  CBC: Recent Labs  Lab 01/24/20 1355 01/25/20 0416  WBC 12.3* 10.4  NEUTROABS 10.6*  --   HGB 15.4 12.8*  HCT 43.6 38.0*  MCV 88.6 92.0  PLT 245 196   Basic Metabolic Panel: Recent Labs  Lab 01/24/20 1355 01/25/20 0416  NA 140 139  K 3.3* 3.9  CL 105 108  CO2 25 27  GLUCOSE 131* 111*  BUN 22 19  CREATININE 1.01 1.08  CALCIUM 9.7 8.7*  MG  --  1.7   GFR: Estimated Creatinine Clearance: 72.6 mL/min (by C-G formula based on SCr of 1.08 mg/dL). Liver Function Tests: Recent Labs  Lab 01/24/20 1355  AST 27  ALT 49*  ALKPHOS 93  BILITOT 1.3*  PROT 6.7  ALBUMIN 3.9   Recent Labs  Lab 01/24/20 1355  LIPASE 19   No results for input(s): AMMONIA in the last 168 hours. Coagulation Profile: Recent Labs  Lab 01/24/20 1355  INR 1.0   Cardiac Enzymes: No results for input(s): CKTOTAL, CKMB, CKMBINDEX, TROPONINI in the last 168 hours. BNP (last 3 results) No results for input(s): PROBNP in the last 8760 hours. HbA1C: No results for input(s): HGBA1C in the last 72 hours. CBG: No results for input(s): GLUCAP in the last 168 hours. Lipid Profile: No results for input(s): CHOL, HDL, LDLCALC, TRIG, CHOLHDL, LDLDIRECT in the last 72 hours. Thyroid Function Tests: No results for input(s): TSH, T4TOTAL, FREET4, T3FREE, THYROIDAB in the last 72 hours. Anemia Panel: No results for input(s): VITAMINB12, FOLATE, FERRITIN, TIBC, IRON, RETICCTPCT in the last 72 hours. Sepsis Labs: Recent Labs  Lab 01/24/20 1355 01/24/20 1836 01/24/20 2334  LATICACIDVEN 1.4 2.1* 1.3    Recent Results (from the past 240 hour(s))   Culture, blood (Routine x 2)     Status: None (Preliminary result)   Collection Time: 01/24/20  1:50 PM   Specimen: BLOOD  Result Value Ref Range Status   Specimen Description BLOOD L FOREARM  Final   Special Requests   Final    BOTTLES DRAWN AEROBIC AND ANAEROBIC Blood Culture results may not be optimal due to an excessive volume of blood received in culture bottles   Culture   Final    NO GROWTH < 24 HOURS Performed at [facility], [address] [place], [place] [address]    Report Status PENDING  Incomplete  Culture, blood (Routine x 2)     Status: None (Preliminary result)   Collection Time: 01/24/20  1:54 PM   Specimen: BLOOD  Result Value Ref Range Status   Specimen Description BLOOD R HAND  Final   Special Requests   Final    BOTTLES DRAWN AEROBIC AND ANAEROBIC Blood Culture adequate volume   Culture   Final  NO GROWTH < 24 HOURS Performed at [facility], [address] [place], [place] [address]    Report Status PENDING  Incomplete  Urine culture     Status: None   Collection Time: 01/24/20  2:16 PM   Specimen: In/Out Cath Urine  Result Value Ref Range Status   Specimen Description   Final    IN/OUT CATH URINE Performed at [facility], [address], [place], [place] [address]    Special Requests   Final    NONE Performed at [facility], [address], [place], [place] [address]    Culture   Final    NO GROWTH Performed at [facility], [address], [place], [place] [address]    Report Status 01/25/2020 FINAL  Final  SARS Coronavirus 2 by RT PCR (hospital order, performed in [facility] hospital lab) Nasopharyngeal Urine, Catheterized     Status: None   Collection Time: 01/24/20  2:16 PM   Specimen: Urine, Catheterized; Nasopharyngeal  Result Value Ref Range Status   SARS Coronavirus 2 NEGATIVE NEGATIVE Final    Comment: (NOTE) SARS-CoV-2 target nucleic acids are NOT DETECTED. The SARS-CoV-2 RNA is  generally detectable in upper and lower respiratory specimens during the acute phase of infection. The lowest concentration of SARS-CoV-2 viral copies this assay can detect is 250 copies / mL. A negative result does not preclude SARS-CoV-2 infection and should not be used as the sole basis for treatment or other patient management decisions.  A negative result may occur with improper specimen collection / handling, submission of specimen other than nasopharyngeal swab, presence of viral mutation(s) within the areas targeted by this assay, and inadequate number of viral copies (<250 copies / mL). A negative result must be combined with clinical observations, patient history, and epidemiological information. Fact Sheet for Patients:   StrictlyIdeas.no Fact Sheet for Healthcare Providers: BankingDealers.co.za This test is not yet approved or cleared  by the [place] FDA and has been authorized for detection and/or diagnosis of SARS-CoV-2 by FDA under an Emergency Use Authorization (EUA).  This EUA will remain in effect (meaning this test can be used) for the duration of the COVID-19 declaration under Section 564(b)(1) of the Act, 21 U.S.C. section 360bbb-3(b)(1), unless the authorization is terminated or revoked sooner. Performed at [facility], [address], [place], [place] [address]          Radiology Studies: CT ABDOMEN PELVIS W CONTRAST  Result Date: 01/24/2020 CLINICAL DATA:  Nausea and vomiting.  Fever. EXAM: CT ABDOMEN AND PELVIS WITH CONTRAST TECHNIQUE: Multidetector CT imaging of the abdomen and pelvis was performed using the standard protocol following bolus administration of intravenous contrast. CONTRAST:  140mL OMNIPAQUE IOHEXOL 300 MG/ML  SOLN COMPARISON:  Remote abdominopelvic CT 10/02/2010 FINDINGS: Lower chest: Motion artifact. Majority of the chest is included in the field of view. Dependent atelectasis,  right greater than left. Upper normal heart size. There are coronary artery calcifications. No pleural fluid. Mild wall thickening of the distal esophagus. Hepatobiliary: Prominent liver spanning 20 cm cranial caudal. Mild hepatic steatosis. No evidence of focal lesion allowing for limitations related to mild motion artifact. Gallbladder physiologically distended, no calcified stone. No biliary dilatation. Pancreas: Fatty atrophy.  No ductal dilatation or inflammation. Spleen: Normal in size without focal abnormality. Adrenals/Urinary Tract: Normal adrenal glands. No hydronephrosis. Homogeneous renal enhancement. Minimal bilateral perinephric edema. Mild cortical scarring in the lower right kidney. There is an exophytic 10 mm indeterminate  lesion from the lower left kidney that is isodense to adjacent renal parenchyma, series 2, image 55. urinary bladder is partially distended. No bladder wall thickening. Stomach/Bowel: Mild distal esophageal wall thickening with tiny hiatal hernia. Nondistended stomach. No obvious gastric wall thickening. Normal positioning of the duodenum and ligament of Treitz. No obstruction, administered enteric contrast reaches the colon. There is no small bowel inflammation. Terminal ileum is normal. Appendix appears normal, series 5, image 43. No appendicitis. Colonic diverticulosis involving the descending and sigmoid colon. No diverticulitis. Moderate stool in the proximal colon. No colonic wall thickening or inflammation. No abnormal rectal distention. Vascular/Lymphatic: Normal caliber abdominal aorta. Portal vein is patent. No adenopathy. Reproductive: Prostate is unremarkable. Other: Moderate and small left fat containing inguinal hernias. Tiny fat containing umbilical hernia. No free air, free fluid, or intra-abdominal fluid collection. Musculoskeletal: There are no acute or suspicious osseous abnormalities. IMPRESSION: 1. Mild distal esophageal wall thickening with tiny hiatal  hernia, can be seen with reflux or esophagitis. 2. No other acute abnormality in the abdomen/pelvis. 3. Colonic diverticulosis without diverticulitis. 4. Indeterminate 10 mm exophytic lesion from the lower left kidney. Recommend nonemergent renal protocol MRI for characterization on an elective basis, when patient is able to tolerate breath hold technique. 5. Bilateral fat containing inguinal hernias. Tiny fat containing umbilical hernia. Aortic Atherosclerosis (ICD10-I70.0). Electronically Signed   By: Narda Rutherford M.D.   On: 01/24/2020 19:48   DG Chest Port 1 View  Result Date: 01/24/2020 CLINICAL DATA:  Fever and vomiting. EXAM: PORTABLE CHEST 1 VIEW COMPARISON:  Dec 22, 2019 FINDINGS: Decreased lung volumes are seen which is likely secondary to the degree of patient inspiration. Very mild atelectasis and/or early infiltrate is seen within the left lung base. The heart size and mediastinal contours are within normal limits. Degenerative changes seen within the thoracic spine. IMPRESSION: Very mild left basilar atelectasis and/or early infiltrate. Electronically Signed   By: Aram Candela M.D.   On: 01/24/2020 15:04        Scheduled Meds: . ascorbic acid  500 mg Oral Daily  . aspirin EC  81 mg Oral Daily  . atorvastatin  40 mg Oral q1800  . cholecalciferol  2,000 Units Oral Daily  . clopidogrel  75 mg Oral Daily  . dextromethorphan-guaiFENesin  1 tablet Oral BID  . docusate sodium  300 mg Oral Daily  . enoxaparin (LOVENOX) injection  40 mg Subcutaneous Q24H  . escitalopram  20 mg Oral QPM  . famotidine  20 mg Oral Daily  . loratadine  10 mg Oral Daily  . mirtazapine  30 mg Oral Daily  . potassium chloride  40 mEq Oral Once   Continuous Infusions: . sodium chloride 100 mL/hr at 01/26/20 0202  . azithromycin 500 mg (01/25/20 2240)  . cefTRIAXone (ROCEPHIN)  IV Stopped (01/25/20 2236)  . metronidazole 500 mg (01/26/20 0003)     LOS: 2 days    Time spent: 30 mins      Charise Killian, MD [facility] Pager [phone]  If 7PM-7AM, please contact night-coverage [email or web address] 01/26/2020, 7:52 AM

## 2020-01-26 NOTE — TOC Progression Note (Signed)
Transition of Care [facility]) - Progression Note    Patient Details  Name: Maurice Moses MRN: [id number] Date of Birth: 11/10/1945  Transition of Care [facility] CM/SW Contact  Barrie Dunker, RN Phone Number: 01/26/2020, 2:45 PM  Clinical Narrative:    The patient's wife has decided that they are willing to look into SNF, She wants me to send the informaiton to [facility] She is going to [facility] to look into getting him in there, I called [facility] and got the fax number and faxed information to [place] at [phone]   Expected Discharge Plan: Home w Home Health Services Barriers to Discharge: Continued Medical Work up  Expected Discharge Plan and Services Expected Discharge Plan: Home w Home Health Services   Discharge Planning Services: CM Consult   Living arrangements for the past 2 months: Single Family Home                 DME Arranged: N/A                     Social Determinants of Health (SDOH) Interventions    Readmission Risk Interventions No flowsheet data found.

## 2020-01-26 NOTE — Evaluation (Signed)
Physical Therapy Evaluation Patient Details Name: Maurice Moses MRN: [id number] DOB: 10-24-1945 Today's Date: 01/26/2020   History of Present Illness  74 y.o. male with medical history significant of hypertension, hyperlipidemia, stroke with difficulty speaking, GERD, depression, SDH 10/13/2015, IBS, who presents with cough, fever, altered mental status. Pt admitted with acute hypoxic respiratory failure secondary to CAP and possible aspiration pneumonia.  Clinical Impression  Pt minimally interactive with PT, on first attempt to see him earlier in the day he was able to open eyes and give some seemingly appropriate responses, per OT report earlier he was slow to process but able to interact.  However t/o PT exam he only rarely opened eyes and struggled to do much active or meaningful activity with any regularity.  He was able to give some AROM/effort with UE strength testing, less so with LEs and on attempts to do mobility, etc he could not open eyes at all or engage meaningfully.  Would like to further assess OOB, however it appears that the wife is very motivated to take him home and manage with in-home assistance.  Per today's performance this will likely be difficult and PT is recommending STR based on limited clinical/functional assessment today.    Follow Up Recommendations SNF(apparently the likely plan is home with increased aide cover)    Equipment Recommendations  None recommended by PT    Recommendations for Other Services       Precautions / Restrictions Precautions Precautions: Fall Restrictions Weight Bearing Restrictions: No      Mobility  Bed Mobility               General bed mobility comments: attempted transition toward EOB with cues to get to sitting.  Direct cuing to try to reach UE/torso with no initiation of motion and very little ability to even acknowledge cues from PT to do any mobility. Ulimately deferred secondary to pt level of  consciousness  Transfers                 General transfer comment: deferred 2/2 cognition and pt adamantly declining  Ambulation/Gait                Stairs            Wheelchair Mobility    Modified Rankin (Stroke Patients Only)       Balance                                             Pertinent Vitals/Pain Pain Assessment: (note rated 2/2 lethargy, does not indicate pain with PROM)    Home Living Family/patient expects to be discharged to:: Private residence Living Arrangements: Spouse/significant other Available Help at Discharge: (does have 8+hr/day paid assistance) Type of Home: House Home Access: Ramped entrance     Home Layout: Two level;Able to live on main level with bedroom/bathroom Home Equipment: Wheelchair - manual;Other (comment)(gathered from prior documentation, apparently has RW) Additional Comments: Pt unreliable historian, reports having rollator; chart indicates 2WW, no family present to verify    Prior Function Level of Independence: Needs assistance   Gait / Transfers Assistance Needed: pt unreliable historian - reports ambulating with rollator, per PCA present he needs +2 assist with RW and w/c follow  ADL's / Homemaking Assistance Needed: pt unreliable historian - per OT note he reports doesn't need assist for ADL but PCA reports pt  requires significant assist for sponge bath, dressing, and toileting, using diapers 2/2 incontinence  Comments: Pt with minimal interaction t/o the session, unable to communicate in full sentences or follow most lines of questioning     Hand Dominance   Dominant Hand: Right    Extremity/Trunk Assessment   Upper Extremity Assessment Upper Extremity Assessment: Difficult to assess due to impaired cognition(b/l elbow strength 4/5 against resist, minimal shld AROM)    Lower Extremity Assessment Lower Extremity Assessment: Difficult to assess due to impaired cognition(pt less  interactive while testing LEs, very little AROM)       Communication   Communication: Expressive difficulties(pt more lethargic than earlier OT eval)  Cognition Arousal/Alertness: Lethargic Behavior During Therapy: Flat affect Overall Cognitive Status: Difficult to assess                                 General Comments: unsure how mental status is compared to baseline, however he was apparently more interative with OT earlier this AM      General Comments General comments (skin integrity, edema, etc.): Pt initially with some opening of eyes and interaction with PT (though verbally still quite limited), as session progressed he was less interactive, less awake and ultimately very limited with ability to participate with PT exam    Exercises Other Exercises Other Exercises: Pt/PCA present educated in how to support pt's participation in tasks   Assessment/Plan    PT Assessment Patient needs continued PT services  PT Problem List Decreased strength;Decreased activity tolerance;Decreased mobility;Decreased balance;Decreased coordination;Decreased cognition;Decreased knowledge of use of DME;Decreased safety awareness;Decreased range of motion       PT Treatment Interventions DME instruction;Gait training;Therapeutic activities;Functional mobility training;Therapeutic exercise;Balance training;Neuromuscular re-education;Patient/family education;Cognitive remediation    PT Goals (Current goals can be found in the Care Plan section)  Acute Rehab PT Goals Patient Stated Goal: pt unable to state PT Goal Formulation: Patient unable to participate in goal setting Time For Goal Achievement: 02/09/20 Potential to Achieve Goals: Fair    Frequency Min 2X/week   Barriers to discharge        Co-evaluation               AM-PAC PT "6 Clicks" Mobility  Outcome Measure Help needed turning from your back to your side while in a flat bed without using bedrails?:  Total Help needed moving from lying on your back to sitting on the side of a flat bed without using bedrails?: Total Help needed moving to and from a bed to a chair (including a wheelchair)?: Total Help needed standing up from a chair using your arms (e.g., wheelchair or bedside chair)?: Total Help needed to walk in hospital room?: Total Help needed climbing 3-5 steps with a railing? : Total 6 Click Score: 6    End of Session   Activity Tolerance: Patient limited by lethargy Patient left: with bed alarm set;with call bell/phone within reach;with nursing/sitter in room(outside paid assistant)   PT Visit Diagnosis: Muscle weakness (generalized) (M62.81);Difficulty in walking, not elsewhere classified (R26.2)    Time: 1610-9604 PT Time Calculation (min) (ACUTE ONLY): 20 min   Charges:   PT Evaluation $PT Eval Low Complexity: 1 Low          Malachi Pro, DPT 01/26/2020, 12:12 PM

## 2020-01-27 DIAGNOSIS — F0150 Vascular dementia without behavioral disturbance: Secondary | ICD-10-CM | POA: Diagnosis present

## 2020-01-27 DIAGNOSIS — F0392 Unspecified dementia, unspecified severity, with psychotic disturbance: Secondary | ICD-10-CM | POA: Diagnosis present

## 2020-01-27 LAB — BASIC METABOLIC PANEL
Anion gap: 6 (ref 5–15)
BUN: 15 mg/dL (ref 8–23)
CO2: 27 mmol/L (ref 22–32)
Calcium: 8.8 mg/dL — ABNORMAL LOW (ref 8.9–10.3)
Chloride: 108 mmol/L (ref 98–111)
Creatinine, Ser: 1 mg/dL (ref 0.61–1.24)
GFR calc Af Amer: >60 mL/min (ref >60)
GFR calc non Af Amer: >60 mL/min (ref >60)
Glucose, Bld: 92 mg/dL (ref 70–99)
Potassium: 3.2 mmol/L — ABNORMAL LOW (ref 3.5–5.1)
Sodium: 141 mmol/L (ref 135–145)

## 2020-01-27 LAB — CBC
HCT: 35.1 % — ABNORMAL LOW (ref 39.0–52.0)
Hemoglobin: 12.2 g/dL — ABNORMAL LOW (ref 13.0–17.0)
MCH: 31.3 pg (ref 26.0–34.0)
MCHC: 34.8 g/dL (ref 30.0–36.0)
MCV: 90 fL (ref 80.0–100.0)
Platelets: 214 10*3/uL (ref 150–400)
RBC: 3.9 MIL/uL — ABNORMAL LOW (ref 4.22–5.81)
RDW: 13.2 % (ref 11.5–15.5)
WBC: 5.7 10*3/uL (ref 4.0–10.5)
nRBC: 0 % (ref 0.0–0.2)

## 2020-01-27 LAB — TSH: TSH: 5.014 u[IU]/mL — ABNORMAL HIGH (ref 0.350–4.500)

## 2020-01-27 LAB — MAGNESIUM: Magnesium: 1.8 mg/dL (ref 1.7–2.4)

## 2020-01-27 LAB — VITAMIN B12: Vitamin B-12: 775 pg/mL (ref 180–914)

## 2020-01-27 MED ORDER — ALBUTEROL SULFATE (2.5 MG/3ML) 0.083% IN NEBU: 2.5000 mg | INHALATION_SOLUTION | Freq: Four times a day (QID) | RESPIRATORY_TRACT | Status: DC | PRN

## 2020-01-27 MED ORDER — METRONIDAZOLE 500 MG PO TABS
500.0000 mg | ORAL_TABLET | Freq: Three times a day (TID) | ORAL | Status: DC
  Administered 2020-01-27 – 2020-01-28 (×3): 500 mg via ORAL
  Filled 2020-01-27 (×3): qty 1

## 2020-01-27 NOTE — TOC Progression Note (Signed)
Transition of Care [facility]) - Progression Note    Patient Details  Name: Maurice Moses MRN: [id number] Date of Birth: 16-Jun-1946  Transition of Care [facility]) CM/SW Contact  Barrie Dunker, RN Phone Number: 01/27/2020, 2:03 PM  Clinical Narrative:    Iline Oven approval Auth ID [id number] Ref number [id number] Next review date 6/11 Will anticipate dc tomorrow I called Reuel Boom at [facility] and provided the information  Expected Discharge Plan: Home w Home Health Services Barriers to Discharge: Continued Medical Work up  Expected Discharge Plan and Services Expected Discharge Plan: Home w Home Health Services   Discharge Planning Services: CM Consult   Living arrangements for the past 2 months: Single Family Home                 DME Arranged: N/A                     Social Determinants of Health (SDOH) Interventions    Readmission Risk Interventions No flowsheet data found.

## 2020-01-27 NOTE — Care Management Important Message (Signed)
Important Message  Patient Details  Name: Maurice Moses MRN: [id number] Date of Birth: Sep 02, 1945   Medicare Important Message Given:  Yes     [name] 01/27/2020, 1:55 PM

## 2020-01-27 NOTE — Progress Notes (Signed)
PROGRESS NOTE    Maurice Moses  CXK:[id number] DOB: 08/08/1946 DOA: 01/24/2020 PCP: Rosann Auerbach, MD    Chief Complaint  Patient presents with  . Code Sepsis    Brief Narrative:  74 year old male with hypertension, hyperlipidemia, stroke with dysarthria, GERD, depression, history of subdural hematoma in 2017, IBS presented with fever, cough and acute change in mental status. Patient found to have elevated WBC, hypokalemia, febrile with fever of one 1.90 Fahrenheit, tachypnea with acute hypoxic respiratory failure requiring 4 L nasal cannula. Chest x-ray showed left lower lobe infiltrate.    Patient admitted for acute respiratory failure with hypoxia and sepsis secondary to possible aspiration pneumonia   Assessment & Plan:   Principal Problem:   Acute respiratory failure with hypoxia ([place]) Secondary to aspiration pneumonia.  Continue IV Rocephin and Flagyl.  Received 3 days of azithromycin.  Blood cultures, urine for strep and Legionella antigen all negative.  Continue as needed albuterol neb. Still on 2 L via nasal cannula.  Wean as tolerated.  Active Problems: Severe sepsis without septic shock Present on admission, now resolved.  Secondary to pneumonia.  Improving with IV antibiotics.  Aspiration pneumonia As above.  Empiric antibiotic.  SLP eval recommends dysphagia level 3 diet.  Wean oxygen as tolerated.  Essential hypertension Stable.  Resume home meds  Acute toxic metabolic encephalopathy Secondary to sepsis.  Improving.  Apart from dysarthria patient does have confusion at baseline which is suspected due to vascular dementia.  Vascular dementia with history of stroke No prior documentation of dementia but appears that patient does have mild to moderate dementia upon discussion with his wife and evaluation this admission.  Continue aspirin, Plavix and statin.   Exophytic lesion of left kidney.   Incidental finding on CT abdomen.  Radiology recommends renal MRI  electively.  Generalized weakness PT recommends SNF.  Hypokalemia Replenished     DVT prophylaxis: Subcu Lovenox Code Status: DNR Family Communication: Wife at bedside Disposition:   Status is: Inpatient  Remains inpatient appropriate because:IV treatments appropriate due to intensity of illness or inability to take PO.  Still requiring IV antibiotic for aspiration pneumonia and still in acute respiratory failure.  Possible discharge to SNF tomorrow if symptoms improve.   Dispo: The patient is from: Home              Anticipated d/c is to: SNF              Anticipated d/c date is: 1 day              Patient currently is not medically stable to d/c.       Consultants:   None   Procedures: CT abdomen pelvis  Antimicrobials: IV Rocephin and p.o. Flagyl.  Subjective: Was confused overnight and pulled out his IV. denies any pain or shortness of breath  Objective: Vitals:   01/25/20 2213 01/26/20 0752 01/27/20 0020 01/27/20 0824  BP: (!) 158/77 (!) 157/77 (!) 166/93 (!) 157/69  Pulse: (!) 59 (!) 57 65 62  Resp: 15 16 18 14   Temp: 98.3 F (36.8 C) 98.4 F (36.9 C) 98.4 F (36.9 C) 98.1 F (36.7 C)  TempSrc: Oral  Oral   SpO2: 98% 100% 94% 96%  Weight:      Height:        Intake/Output Summary (Last 24 hours) at 01/27/2020 1044 Last data filed at 01/27/2020 0935 Gross per 24 hour  Intake 0 ml  Output --  Net 0 ml  Filed Weights   01/24/20 1405 01/24/20 2306  Weight: (S) 112 kg 107.8 kg    Examination:  General: obese male in AND HEENT: moist mucosa, supple neck  chest: expiratory wheezing b/l CVS: NS1&S2 Gi: Soft, NT, ND Musculoskeletal:w arm, no edema CNS;AAOX2, dysarthria   Data Reviewed: I have personally reviewed following labs and imaging studies  CBC: Recent Labs  Lab 01/24/20 1355 01/25/20 0416 01/27/20 0501  WBC 12.3* 10.4 5.7  NEUTROABS 10.6*  --   --   HGB 15.4 12.8* 12.2*  HCT 43.6 38.0* 35.1*  MCV 88.6 92.0 90.0  PLT 245  196 214    Basic Metabolic Panel: Recent Labs  Lab 01/24/20 1355 01/25/20 0416 01/27/20 0501  NA 140 139 141  K 3.3* 3.9 3.2*  CL 105 108 108  CO2 25 27 27   GLUCOSE 131* 111* 92  BUN 22 19 15   CREATININE 1.01 1.08 1.00  CALCIUM 9.7 8.7* 8.8*  MG  --  1.7 1.8    GFR: Estimated Creatinine Clearance: 78.5 mL/min (by C-G formula based on SCr of 1 mg/dL).  Liver Function Tests: Recent Labs  Lab 01/24/20 1355  AST 27  ALT 49*  ALKPHOS 93  BILITOT 1.3*  PROT 6.7  ALBUMIN 3.9    CBG: No results for input(s): GLUCAP in the last 168 hours.   Recent Results (from the past 240 hour(s))  Culture, blood (Routine x 2)     Status: None (Preliminary result)   Collection Time: 01/24/20  1:50 PM   Specimen: BLOOD  Result Value Ref Range Status   Specimen Description BLOOD L FOREARM  Final   Special Requests   Final    BOTTLES DRAWN AEROBIC AND ANAEROBIC Blood Culture results may not be optimal due to an excessive volume of blood received in culture bottles   Culture   Final    NO GROWTH 3 DAYS Performed at [facility], [address], [place], [address] [place]    Report Status PENDING  Incomplete  Culture, blood (Routine x 2)     Status: None (Preliminary result)   Collection Time: 01/24/20  1:54 PM   Specimen: BLOOD  Result Value Ref Range Status   Specimen Description BLOOD R HAND  Final   Special Requests   Final    BOTTLES DRAWN AEROBIC AND ANAEROBIC Blood Culture adequate volume   Culture   Final    NO GROWTH 3 DAYS Performed at [facility], [address] [place], [address] [place]    Report Status PENDING  Incomplete  Urine culture     Status: None   Collection Time: 01/24/20  2:16 PM   Specimen: In/Out Cath Urine  Result Value Ref Range Status   Specimen Description   Final    IN/OUT CATH URINE Performed at [facility], [address] [place], [address] [place]    Special Requests   Final    NONE Performed at [facility], [address] [place], [address] [place]    Culture   Final    NO GROWTH Performed at [facility], [address] [place], [address] [place]    Report Status 01/25/2020 FINAL  Final  SARS Coronavirus 2 by RT PCR (hospital order, performed in [facility] hospital lab) Nasopharyngeal Urine, Catheterized     Status: None   Collection Time: 01/24/20  2:16 PM   Specimen: Urine, Catheterized; Nasopharyngeal  Result Value Ref Range Status   SARS Coronavirus  2 NEGATIVE NEGATIVE Final    Comment: (NOTE) SARS-CoV-2 target nucleic acids are NOT DETECTED. The SARS-CoV-2 RNA is generally detectable in upper and lower respiratory specimens during the acute phase of infection. The lowest concentration of SARS-CoV-2 viral copies this assay can detect is 250 copies / mL. A negative result does not preclude SARS-CoV-2 infection and should not be used as the sole basis for treatment or other patient management decisions.  A negative result may occur with improper specimen collection / handling, submission of specimen other than nasopharyngeal swab, presence of viral mutation(s) within the areas targeted by this assay, and inadequate number of viral copies (<250 copies / mL). A negative result must be combined with clinical observations, patient history, and epidemiological information. Fact Sheet for Patients:   BoilerBrush.com.cy Fact Sheet for Healthcare Providers: https://pope.com/ This test is not yet approved or cleared  by the [place] FDA and has been authorized for detection and/or diagnosis of SARS-CoV-2 by FDA under an Emergency Use Authorization (EUA).  This EUA will remain in effect (meaning this test can be used) for the duration of the COVID-19 declaration under Section 564(b)(1) of the Act, 21 U.S.C. section 360bbb-3(b)(1), unless the authorization is terminated or revoked sooner. Performed at [facility], [address], [place], [place] [address]          Radiology Studies: No results found.      Scheduled Meds: . ascorbic acid  500 mg Oral Daily  . aspirin EC  81 mg Oral Daily  . atorvastatin  40 mg Oral q1800  . cholecalciferol  2,000 Units Oral Daily  . clopidogrel  75 mg Oral Daily  . dextromethorphan-guaiFENesin  1 tablet Oral BID  . docusate sodium  300 mg Oral Daily  . enoxaparin (LOVENOX) injection  40 mg Subcutaneous Q24H  . escitalopram  20 mg Oral QPM  . famotidine  20 mg Oral Daily  . hydrochlorothiazide  12.5 mg Oral Daily  . lisinopril  40 mg Oral Daily  . loratadine  10 mg Oral Daily  . metroNIDAZOLE  500 mg Oral Q8H  . mirtazapine  30 mg Oral Daily  . potassium chloride  40 mEq Oral Once   Continuous Infusions: . cefTRIAXone (ROCEPHIN)  IV 1 g (01/26/20 2208)     LOS: 3 days    Time spent: 35 minutes   Rielly Corlett, MD [facility]   To contact the attending provider between 7A-7P or the covering provider during after hours 7P-7A, please log into the web site [email or web address] and access using universal [place] password for that web site. If you do not have the password, please call the hospital operator.  01/27/2020, 10:44 AM

## 2020-01-27 NOTE — Plan of Care (Signed)
  Problem: Education: Goal: Knowledge of General Education information will improve Description: Including pain rating scale, medication(s)/side effects and non-pharmacologic comfort measures Outcome: Progressing   Problem: Health Behavior/Discharge Planning: Goal: Ability to manage health-related needs will improve Outcome: Progressing   Problem: Clinical Measurements: Goal: Ability to maintain clinical measurements within normal limits will improve Outcome: Progressing Goal: Will remain free from infection Outcome: Progressing Goal: Diagnostic test results will improve Outcome: Progressing Goal: Respiratory complications will improve Outcome: Progressing Goal: Cardiovascular complication will be avoided Outcome: Progressing   Problem: Activity: Goal: Risk for activity intolerance will decrease Outcome: Progressing   Problem: Nutrition: Goal: Adequate nutrition will be maintained Outcome: Progressing   Problem: Coping: Goal: Level of anxiety will decrease Outcome: Progressing   Problem: Elimination: Goal: Will not experience complications related to bowel motility Outcome: Progressing Goal: Will not experience complications related to urinary retention Outcome: Progressing   Problem: Pain Managment: Goal: General experience of comfort will improve Outcome: Progressing   Problem: Safety: Goal: Ability to remain free from injury will improve Outcome: Progressing   Problem: Skin Integrity: Goal: Risk for impaired skin integrity will decrease Outcome: Progressing   Problem: Fluid Volume: Goal: Hemodynamic stability will improve Outcome: Progressing   Problem: Clinical Measurements: Goal: Diagnostic test results will improve Outcome: Progressing Goal: Signs and symptoms of infection will decrease Outcome: Progressing   Problem: Respiratory: Goal: Ability to maintain adequate ventilation will improve Outcome: Progressing   Problem: Activity: Goal: Ability  to tolerate increased activity will improve Outcome: Progressing   Problem: Clinical Measurements: Goal: Ability to maintain a body temperature in the normal range will improve Outcome: Progressing   Problem: Respiratory: Goal: Ability to maintain adequate ventilation will improve Outcome: Progressing Goal: Ability to maintain a clear airway will improve Outcome: Progressing   Problem: Activity: Goal: Ability to tolerate increased activity will improve Outcome: Progressing   Problem: Clinical Measurements: Goal: Ability to maintain a body temperature in the normal range will improve Outcome: Progressing   Problem: Respiratory: Goal: Ability to maintain adequate ventilation will improve Outcome: Progressing Goal: Ability to maintain a clear airway will improve Outcome: Progressing

## 2020-01-28 DIAGNOSIS — F0150 Vascular dementia without behavioral disturbance: Secondary | ICD-10-CM

## 2020-01-28 LAB — SARS CORONAVIRUS 2 (TAT 6-24 HRS): SARS Coronavirus 2: NEGATIVE

## 2020-01-28 MED ORDER — HYDROCODONE-ACETAMINOPHEN 5-325 MG PO TABS: 1.0000 | ORAL_TABLET | ORAL | 0 refills | Status: AC | PRN

## 2020-01-28 MED ORDER — HYDROCHLOROTHIAZIDE 12.5 MG PO CAPS: 25.0000 mg | ORAL_CAPSULE | Freq: Every day | ORAL | 0 refills | Status: DC

## 2020-01-28 MED ORDER — HYDROCORTISONE 0.5 % EX CREA
TOPICAL_CREAM | Freq: Two times a day (BID) | CUTANEOUS | Status: DC
  Filled 2020-01-28: qty 28.35

## 2020-01-28 MED ORDER — AMOXICILLIN-POT CLAVULANATE 875-125 MG PO TABS
1.0000 | ORAL_TABLET | Freq: Two times a day (BID) | ORAL | 0 refills | Status: AC
Start: 2020-01-28 — End: 2020-01-30

## 2020-01-28 MED ORDER — HYDROCHLOROTHIAZIDE 25 MG PO TABS
25.0000 mg | ORAL_TABLET | Freq: Every day | ORAL | Status: DC
  Administered 2020-01-28 (×2): 25 mg via ORAL
  Filled 2020-01-28 (×3): qty 1

## 2020-01-28 NOTE — Discharge Summary (Signed)
Physician Discharge Summary  Maurice FootsClaude Moses ZOX:[id number] DOB: 08/03/1946 DOA: 01/24/2020  PCP: Josephina GipLinfors, Eugene W, MD  Admit date: 01/24/2020 Discharge date: 01/28/2020  Admitted From: Home  Disposition: Skilled nursing facility  Recommendations for Outpatient Follow-up:  Follow-up with MD at SNF in 1 week. Patient will complete 7-day course of antibiotic for pneumonia after 6/12. Patient needs an MRI of the abdomen with contrast to evaluate the exophytic lesion of left kidney once acute symptoms have resolved.   Equipment/Devices: As per therapy at the facility  Discharge Condition: Fair CODE STATUS: DNR Diet recommendation: Dysphagia level 2 with thin liquids   Discharge Diagnoses:  Principal Problem:   Acute respiratory failure with hypoxia (HCC)   Active Problems: Severe sepsis without septic shock Aspiration pneumonia of the left lower lobe, secondary to gastric contents.   Essential hypertension   Hyperlipidemia LDL goal <70   Stroke (HCC)   GERD (gastroesophageal reflux disease)   Hypokalemia   Aspiration pneumonia (HCC)   Acute metabolic encephalopathy   Nausea & vomiting   Vascular dementia [facility](HCC)  Brief narrative/HPI 74 year old male with hypertension, hyperlipidemia, stroke with dysarthria, GERD, depression, history of subdural hematoma in 2017, IBS presented with fever, cough and acute change in mental status. Patient found to have elevated WBC, hypokalemia, febrile with fever of one 1.90 Fahrenheit, tachypnea with acute hypoxic respiratory failure requiring 4 L nasal cannula. Chest x-ray showed left lower lobe infiltrate.    Patient admitted for acute respiratory failure with hypoxia and sepsis secondary to possible aspiration pneumonia.  Principal Problem:   Acute respiratory failure with hypoxia (HCC) Secondary to aspiration pneumonia.    Received IV Rocephin, Flagyl and 3 days of IV azithromycin.  Blood cultures, urine for strep and Legionella antigen all  negative.   Respiratory symptoms improved.  Will discharge on oral Augmentin to complete 7-day course of antibiotic.  Active Problems: Severe sepsis without septic shock Present on admission, now resolved.  Secondary to pneumonia.    Resolved.  Aspiration pneumonia of the left lung Empiric antibiotics as above.Marland Kitchen.  SLP eval recommends dysphagia level 2 diet.  Currently on 2 L oxygen via nasal cannula and can be weaned as outpatient.  Essential hypertension Blood pressure elevated.  I increased his HCTZ dose to 25 mg daily.  Acute toxic metabolic encephalopathy Secondary to sepsis.  Improving.  Apart from dysarthria patient does have confusion at baseline which is suspected due to vascular dementia.  Vascular dementia with history of stroke No prior documentation of dementia but appears that patient does have mild to moderate dementia upon discussion with his wife and evaluation this admission.  Continue aspirin, Plavix and statin.   Exophytic lesion of left kidney.   Incidental finding on CT abdomen.  Radiology recommends renal MRI electively and can be done as outpatient.  Generalized weakness PT recommends SNF.  Hypokalemia Replenished  Chronic pain On Vicodin as outpatient which will be continued  Procedure: CT abdomen Family communication: Wife at bedside Consults: None   Discharge Instructions   Allergies as of 01/28/2020      Reactions   Morphine And Related Other (See Comments)   Agitation - wife does not want him to have it   Doxycycline Nausea And Vomiting      Medication List    TAKE these medications   amoxicillin-clavulanate 875-125 MG tablet Commonly known as: Augmentin Take 1 tablet by mouth 2 (two) times daily for 2 days.   ascorbic acid 500 MG tablet Commonly known as: VITAMIN C  Take 500 mg by mouth daily.   aspirin 81 MG EC tablet Take 1 tablet (81 mg total) by mouth daily.   atorvastatin 40 MG tablet Commonly known as:  LIPITOR Take 1 tablet (40 mg total) by mouth daily at 6 PM.   cetirizine 10 MG tablet Commonly known as: ZYRTEC Take 10 mg by mouth daily.   cholecalciferol 1000 units tablet Commonly known as: VITAMIN D Take 2,000 Units by mouth daily.   clopidogrel 75 MG tablet Commonly known as: PLAVIX Take 1 tablet (75 mg total) by mouth daily.   docusate sodium 100 MG capsule Commonly known as: COLACE Take 300 mg by mouth daily.   escitalopram 20 MG tablet Commonly known as: LEXAPRO Take 20 mg by mouth every evening.   esomeprazole 40 MG capsule Commonly known as: NEXIUM Take 40 mg by mouth every evening.   hydrochlorothiazide 12.5 MG capsule Commonly known as: MICROZIDE Take 2 capsules (25 mg total) by mouth daily. What changed: how much to take   HYDROcodone-acetaminophen 5-325 MG tablet Commonly known as: NORCO/VICODIN Take 1 tablet by mouth every 4 (four) hours as needed for moderate pain (Max 7 per day). What changed: how much to take   mirtazapine 30 MG disintegrating tablet Commonly known as: REMERON SOL-TAB Take 30 mg by mouth daily.   Omega-3 1000 MG Caps Take 1,000 mg by mouth daily.   quinapril 40 MG tablet Commonly known as: ACCUPRIL Take 40 mg by mouth at bedtime.       Follow-up Information    MD at SNF in 1 week Follow up.              Allergies  Allergen Reactions  . Morphine And Related Other (See Comments)    Agitation - wife does not want him to have it  . Doxycycline Nausea And Vomiting     Procedures/Studies: CT ABDOMEN PELVIS W CONTRAST  Result Date: 01/24/2020 CLINICAL DATA:  Nausea and vomiting.  Fever. EXAM: CT ABDOMEN AND PELVIS WITH CONTRAST TECHNIQUE: Multidetector CT imaging of the abdomen and pelvis was performed using the standard protocol following bolus administration of intravenous contrast. CONTRAST:  OMNIPAQUE IOHEXOL 300 MG/ML  SOLN COMPARISON:  Remote abdominopelvic CT 10/02/2010 FINDINGS: Lower chest: Motion  artifact. Majority of the chest is included in the field of view. Dependent atelectasis, right greater than left. Upper normal heart size. There are coronary artery calcifications. No pleural fluid. Mild wall thickening of the distal esophagus. Hepatobiliary: Prominent liver spanning 20 cm cranial caudal. Mild hepatic steatosis. No evidence of focal lesion allowing for limitations related to mild motion artifact. Gallbladder physiologically distended, no calcified stone. No biliary dilatation. Pancreas: Fatty atrophy.  No ductal dilatation or inflammation. Spleen: Normal in size without focal abnormality. Adrenals/Urinary Tract: Normal adrenal glands. No hydronephrosis. Homogeneous renal enhancement. Minimal bilateral perinephric edema. Mild cortical scarring in the lower right kidney. There is an exophytic 10 mm indeterminate lesion from the lower left kidney that is isodense to adjacent renal parenchyma, series 2, image 55. urinary bladder is partially distended. No bladder wall thickening. Stomach/Bowel: Mild distal esophageal wall thickening with tiny hiatal hernia. Nondistended stomach. No obvious gastric wall thickening. Normal positioning of the duodenum and ligament of Treitz. No obstruction, administered enteric contrast reaches the colon. There is no small bowel inflammation. Terminal ileum is normal. Appendix appears normal, series 5, image 43. No appendicitis. Colonic diverticulosis involving the descending and sigmoid colon. No diverticulitis. Moderate stool in the proximal colon. No colonic wall  thickening or inflammation. No abnormal rectal distention. Vascular/Lymphatic: Normal caliber abdominal aorta. Portal vein is patent. No adenopathy. Reproductive: Prostate is unremarkable. Other: Moderate and small left fat containing inguinal hernias. Tiny fat containing umbilical hernia. No free air, free fluid, or intra-abdominal fluid collection. Musculoskeletal: There are no acute or suspicious osseous  abnormalities. IMPRESSION: 1. Mild distal esophageal wall thickening with tiny hiatal hernia, can be seen with reflux or esophagitis. 2. No other acute abnormality in the abdomen/pelvis. 3. Colonic diverticulosis without diverticulitis. 4. Indeterminate 10 mm exophytic lesion from the lower left kidney. Recommend nonemergent renal protocol MRI for characterization on an elective basis, when patient is able to tolerate breath hold technique. 5. Bilateral fat containing inguinal hernias. Tiny fat containing umbilical hernia. Aortic Atherosclerosis (ICD10-I70.0). Electronically Signed   By: Narda Rutherford M.D.   On: 01/24/2020 19:48   DG Chest Port 1 View  Result Date: 01/24/2020 CLINICAL DATA:  Fever and vomiting. EXAM: PORTABLE CHEST 1 VIEW COMPARISON:  Dec 22, 2019 FINDINGS: Decreased lung volumes are seen which is likely secondary to the degree of patient inspiration. Very mild atelectasis and/or early infiltrate is seen within the left lung base. The heart size and mediastinal contours are within normal limits. Degenerative changes seen within the thoracic spine. IMPRESSION: Very mild left basilar atelectasis and/or early infiltrate. Electronically Signed   By: Aram Candela M.D.   On: 01/24/2020 15:04       Subjective: Seen and examined.  Reported being nauseous and complained of some neck pain.  No overnight events.  Discharge Exam: Vitals:   01/27/20 2250 01/28/20 0739  BP: (!) 154/108 (!) 177/78  Pulse: 71 72  Resp: 15 16  Temp: 99.1 F (37.3 C) 98.4 F (36.9 C)  SpO2: 97% 97%   Vitals:   01/27/20 0824 01/27/20 1850 01/27/20 2250 01/28/20 0739  BP: (!) 157/69 (!) 152/71 (!) 154/108 (!) 177/78  Pulse: 62 72 71 72  Resp: 14 16 15 16   Temp: 98.1 F (36.7 C) 98.4 F (36.9 C) 99.1 F (37.3 C) 98.4 F (36.9 C)  TempSrc:  Oral Oral Oral  SpO2: 96% 100% 97% 97%  Weight:      Height:        General: Elderly male appears fatigued, not in distress HEENT: Moist mucosa, supple  neck Chest: Clear bilaterally CVs: Normal S1-S2, no murmurs GI: Soft, nondistended, nontender Musculoskeletal: Warm, no edema CNS: Alert and awake, oriented x2, dysarthric   The results of significant diagnostics from this hospitalization (including imaging, microbiology, ancillary and laboratory) are listed below for reference.     Microbiology: Recent Results (from the past 240 hour(s))  Culture, blood (Routine x 2)     Status: None (Preliminary result)   Collection Time: 01/24/20  1:50 PM   Specimen: BLOOD  Result Value Ref Range Status   Specimen Description BLOOD L FOREARM  Final   Special Requests   Final    BOTTLES DRAWN AEROBIC AND ANAEROBIC Blood Culture results may not be optimal due to an excessive volume of blood received in culture bottles   Culture   Final    NO GROWTH 4 DAYS Performed at [facility], [address] [place], [place]    Report Status PENDING  Incomplete  Culture, blood (Routine x 2)     Status: None (Preliminary result)   Collection Time: 01/24/20  1:54 PM   Specimen: BLOOD  Result Value Ref Range Status   Specimen Description BLOOD R HAND  Final   Special Requests   Final    BOTTLES DRAWN AEROBIC AND ANAEROBIC Blood Culture adequate volume   Culture   Final    NO GROWTH 4 DAYS Performed at [facility], [address] [place], [place] [address]    Report Status PENDING  Incomplete  Urine culture     Status: None   Collection Time: 01/24/20  2:16 PM   Specimen: In/Out Cath Urine  Result Value Ref Range Status   Specimen Description   Final    IN/OUT CATH URINE Performed at [facility], [address], [place], [place] [address]    Special Requests   Final    NONE Performed at [facility], [address] [place], [place] [address]    Culture   Final    NO GROWTH Performed at [facility], [address], [place], [place] [address]    Report Status 01/25/2020 FINAL  Final   SARS Coronavirus 2 by RT PCR (hospital order, performed in [facility] hospital lab) Nasopharyngeal Urine, Catheterized     Status: None   Collection Time: 01/24/20  2:16 PM   Specimen: Urine, Catheterized; Nasopharyngeal  Result Value Ref Range Status   SARS Coronavirus 2 NEGATIVE NEGATIVE Final    Comment: (NOTE) SARS-CoV-2 target nucleic acids are NOT DETECTED. The SARS-CoV-2 RNA is generally detectable in upper and lower respiratory specimens during the acute phase of infection. The lowest concentration of SARS-CoV-2 viral copies this assay can detect is 250 copies / mL. A negative result does not preclude SARS-CoV-2 infection and should not be used as the sole basis for treatment or other patient management decisions.  A negative result may occur with improper specimen collection / handling, submission of specimen other than nasopharyngeal swab, presence of viral mutation(s) within the areas targeted by this assay, and inadequate number of viral copies (<250 copies / mL). A negative result must be combined with clinical observations, patient history, and epidemiological information. Fact Sheet for Patients:   BoilerBrush.com.cy Fact Sheet for Healthcare Providers: https://pope.com/ This test is not yet approved or cleared  by the [place] FDA and has been authorized for detection and/or diagnosis of SARS-CoV-2 by FDA under an Emergency Use Authorization (EUA).  This EUA will remain in effect (meaning this test can be used) for the duration of the COVID-19 declaration under Section 564(b)(1) of the Act, 21 U.S.C. section 360bbb-3(b)(1), unless the authorization is terminated or revoked sooner. Performed at [facility], [address] [place], [place] [address]   SARS CORONAVIRUS 2 (TAT 6-24 HRS) Nasopharyngeal Nasopharyngeal Swab     Status: None   Collection Time: 01/27/20  2:16 PM   Specimen: Nasopharyngeal Swab   Result Value Ref Range Status   SARS Coronavirus 2 NEGATIVE NEGATIVE Final    Comment: (NOTE) SARS-CoV-2 target nucleic acids are NOT DETECTED. The SARS-CoV-2 RNA is generally detectable in upper and lower respiratory specimens during the acute phase of infection. Negative results do not preclude SARS-CoV-2 infection, do not rule out co-infections with other pathogens, and should not be used as the sole basis for treatment or other patient management decisions. Negative results must be combined with clinical observations, patient history, and epidemiological information. The expected result is Negative. Fact Sheet for Patients: HairSlick.no Fact Sheet for Healthcare Providers: quierodirigir.com This test is not yet approved or cleared by the [place] FDA and  has been authorized for detection and/or diagnosis of SARS-CoV-2 by FDA under an  Emergency Use Authorization (EUA). This EUA will remain  in effect (meaning this test can be used) for the duration of the COVID-19 declaration under Section 56 4(b)(1) of the Act, 21 U.S.C. section 360bbb-3(b)(1), unless the authorization is terminated or revoked sooner. Performed at [facility], [address] [place], [place] [address]      Labs: BNP (last 3 results) Recent Labs    12/22/19 1058 01/24/20 1355  BNP 45.0 92.1   Basic Metabolic Panel: Recent Labs  Lab 01/24/20 1355 01/25/20 0416 01/27/20 0501  NA 140 139 141  K 3.3* 3.9 3.2*  CL 105 108 108  CO2 25 27 27   GLUCOSE 131* 111* 92  BUN 22 19 15   CREATININE 1.01 1.08 1.00  CALCIUM 9.7 8.7* 8.8*  MG  --  1.7 1.8   Liver Function Tests: Recent Labs  Lab 01/24/20 1355  AST 27  ALT 49*  ALKPHOS 93  BILITOT 1.3*  PROT 6.7  ALBUMIN 3.9   Recent Labs  Lab 01/24/20 1355  LIPASE 19   No results for input(s): AMMONIA in the last 168 hours. CBC: Recent Labs  Lab 01/24/20 1355 01/25/20 0416  01/27/20 0501  WBC 12.3* 10.4 5.7  NEUTROABS 10.6*  --   --   HGB 15.4 12.8* 12.2*  HCT 43.6 38.0* 35.1*  MCV 88.6 92.0 90.0  PLT 245 196 214   Cardiac Enzymes: No results for input(s): CKTOTAL, CKMB, CKMBINDEX, TROPONINI in the last 168 hours. BNP: Invalid input(s): POCBNP CBG: No results for input(s): GLUCAP in the last 168 hours. D-Dimer No results for input(s): DDIMER in the last 72 hours. Hgb A1c No results for input(s): HGBA1C in the last 72 hours. Lipid Profile No results for input(s): CHOL, HDL, LDLCALC, TRIG, CHOLHDL, LDLDIRECT in the last 72 hours. Thyroid function studies Recent Labs    01/27/20 0501  TSH 5.014*   Anemia work up Recent Labs    01/27/20 1446  VITAMINB12 775   Urinalysis    Component Value Date/Time   COLORURINE YELLOW (A) 01/24/2020 1416   APPEARANCEUR CLEAR (A) 01/24/2020 1416   LABSPEC 1.024 01/24/2020 1416   PHURINE 7.0 01/24/2020 1416   GLUCOSEU NEGATIVE 01/24/2020 1416   HGBUR NEGATIVE 01/24/2020 1416   BILIRUBINUR NEGATIVE 01/24/2020 1416   KETONESUR NEGATIVE 01/24/2020 1416   PROTEINUR NEGATIVE 01/24/2020 1416   NITRITE NEGATIVE 01/24/2020 1416   LEUKOCYTESUR NEGATIVE 01/24/2020 1416   Sepsis Labs Invalid input(s): PROCALCITONIN,  WBC,  LACTICIDVEN Microbiology Recent Results (from the past 240 hour(s))  Culture, blood (Routine x 2)     Status: None (Preliminary result)   Collection Time: 01/24/20  1:50 PM   Specimen: BLOOD  Result Value Ref Range Status   Specimen Description BLOOD L FOREARM  Final   Special Requests   Final    BOTTLES DRAWN AEROBIC AND ANAEROBIC Blood Culture results may not be optimal due to an excessive volume of blood received in culture bottles   Culture   Final    NO GROWTH 4 DAYS Performed at [facility], [address], [place], [address] [place]    Report Status PENDING  Incomplete  Culture, blood (Routine x 2)     Status: None (Preliminary result)   Collection Time: 01/24/20  1:54  PM   Specimen: BLOOD  Result Value Ref Range Status   Specimen Description BLOOD R HAND  Final   Special Requests   Final    BOTTLES DRAWN AEROBIC AND ANAEROBIC Blood  Culture adequate volume   Culture   Final    NO GROWTH 4 DAYS Performed at [facility], [address] [place], [place] [address]    Report Status PENDING  Incomplete  Urine culture     Status: None   Collection Time: 01/24/20  2:16 PM   Specimen: In/Out Cath Urine  Result Value Ref Range Status   Specimen Description   Final    IN/OUT CATH URINE Performed at [facility], [address] [place], [place] [address]    Special Requests   Final    NONE Performed at [facility], [address], [place], [place] [address]    Culture   Final    NO GROWTH Performed at [facility], [address] [place], [place] [address]    Report Status 01/25/2020 FINAL  Final  SARS Coronavirus 2 by RT PCR (hospital order, performed in [facility] hospital lab) Nasopharyngeal Urine, Catheterized     Status: None   Collection Time: 01/24/20  2:16 PM   Specimen: Urine, Catheterized; Nasopharyngeal  Result Value Ref Range Status   SARS Coronavirus 2 NEGATIVE NEGATIVE Final    Comment: (NOTE) SARS-CoV-2 target nucleic acids are NOT DETECTED. The SARS-CoV-2 RNA is generally detectable in upper and lower respiratory specimens during the acute phase of infection. The lowest concentration of SARS-CoV-2 viral copies this assay can detect is 250 copies / mL. A negative result does not preclude SARS-CoV-2 infection and should not be used as the sole basis for treatment or other patient management decisions.  A negative result may occur with improper specimen collection / handling, submission of specimen other than nasopharyngeal swab, presence of viral mutation(s) within the areas targeted by this assay, and inadequate number of viral copies (<250 copies / mL). A negative result must be combined with  clinical observations, patient history, and epidemiological information. Fact Sheet for Patients:   BoilerBrush.com.cy Fact Sheet for Healthcare Providers: https://pope.com/ This test is not yet approved or cleared  by the [place] FDA and has been authorized for detection and/or diagnosis of SARS-CoV-2 by FDA under an Emergency Use Authorization (EUA).  This EUA will remain in effect (meaning this test can be used) for the duration of the COVID-19 declaration under Section 564(b)(1) of the Act, 21 U.S.C. section 360bbb-3(b)(1), unless the authorization is terminated or revoked sooner. Performed at [facility], [address] [place], [place] [address]   SARS CORONAVIRUS 2 (TAT 6-24 HRS) Nasopharyngeal Nasopharyngeal Swab     Status: None   Collection Time: 01/27/20  2:16 PM   Specimen: Nasopharyngeal Swab  Result Value Ref Range Status   SARS Coronavirus 2 NEGATIVE NEGATIVE Final    Comment: (NOTE) SARS-CoV-2 target nucleic acids are NOT DETECTED. The SARS-CoV-2 RNA is generally detectable in upper and lower respiratory specimens during the acute phase of infection. Negative results do not preclude SARS-CoV-2 infection, do not rule out co-infections with other pathogens, and should not be used as the sole basis for treatment or other patient management decisions. Negative results must be combined with clinical observations, patient history, and epidemiological information. The expected result is Negative. Fact Sheet for Patients: HairSlick.no Fact Sheet for Healthcare Providers: quierodirigir.com This test is not yet approved or cleared by the [place] FDA and  has been authorized for detection and/or diagnosis of SARS-CoV-2 by FDA under an Emergency Use Authorization (EUA). This EUA will remain  in effect (meaning this test can be used) for  the duration of  the COVID-19 declaration under Section 56 4(b)(1) of the Act, 21 U.S.C. section 360bbb-3(b)(1), unless the authorization is terminated or revoked sooner. Performed at [facility], [place] [address] [place], [place] [address]      Time coordinating discharge: 35 minutes  SIGNED:   Louellen Molder, MD  [facility] 01/28/2020, 11:49 AM Pager   If 7PM-7AM, please contact night-coverage [email or web address] Password [email or web address]

## 2020-01-28 NOTE — TOC Progression Note (Signed)
Transition of Care [facility]) - Progression Note    Patient Details  Name: Maurice Moses MRN: [id number] Date of Birth: [birth year]  [facility]) CM/SW Contact  Barrie Dunker, RN Phone Number: 01/28/2020, 12:26 PM  Clinical Narrative:     Faxed DC summary and DC transfer papers to [facility], The DC packet in on the chart and the bedside nurse is aware that the patient is ready to DC  Expected Discharge Plan: Home w Home Health Services Barriers to Discharge: Continued Medical Work up  Expected Discharge Plan and Services Expected Discharge Plan: Home w Home Health Services   Discharge Planning Services: CM Consult   Living arrangements for the past 2 months: Single Family Home Expected Discharge Date: 01/28/20               DME Arranged: N/A                     Social Determinants of Health (SDOH) Interventions    Readmission Risk Interventions No flowsheet data found.

## 2020-01-28 NOTE — TOC Progression Note (Signed)
Transition of Care [facility]) - Progression Note    Patient Details  Name: Maurice Moses MRN: [id number] Date of Birth: 05-11-1946  Transition of Care [facility]) CM/SW Contact  Barrie Dunker, RN Phone Number: 01/28/2020, 1:04 PM  Clinical Narrative:     Bedside nurse called report I called EMS to transport to [facility] in [place] The wife is at bedside EMS stated that [facility] is 1 ahead of him, I notified the bedside nurse  Expected Discharge Plan: Home w Home Health Services Barriers to Discharge: Continued Medical Work up  Expected Discharge Plan and Services Expected Discharge Plan: Home w Home Health Services   Discharge Planning Services: CM Consult   Living arrangements for the past 2 months: Single Family Home Expected Discharge Date: 01/28/20               DME Arranged: N/A                     Social Determinants of Health (SDOH) Interventions    Readmission Risk Interventions No flowsheet data found.

## 2020-01-28 NOTE — Progress Notes (Signed)
Discharge instructions provided to patient, wife and [facility] in [place] as well as EMS/transport company. Iv removed, dressing and pressure applied.  Report given to RN at [facility].  Patient transported by stretcher to facility. Wife takes personal items with her to home.

## 2020-01-29 LAB — CULTURE, BLOOD (ROUTINE X 2)
Culture: NO GROWTH
Culture: NO GROWTH
Special Requests: ADEQUATE

## 2020-05-04 ENCOUNTER — Ambulatory Visit: Payer: Medicare PPO | Admitting: Internal Medicine

## 2020-10-07 IMAGING — DX DG CHEST 1V PORT
1 series · 1 of 1 positions shown · non-contrast
Comparison: December 22, 2019

CLINICAL DATA: Fever and vomiting.

EXAM:
PORTABLE CHEST 1 VIEW

[chest ap]
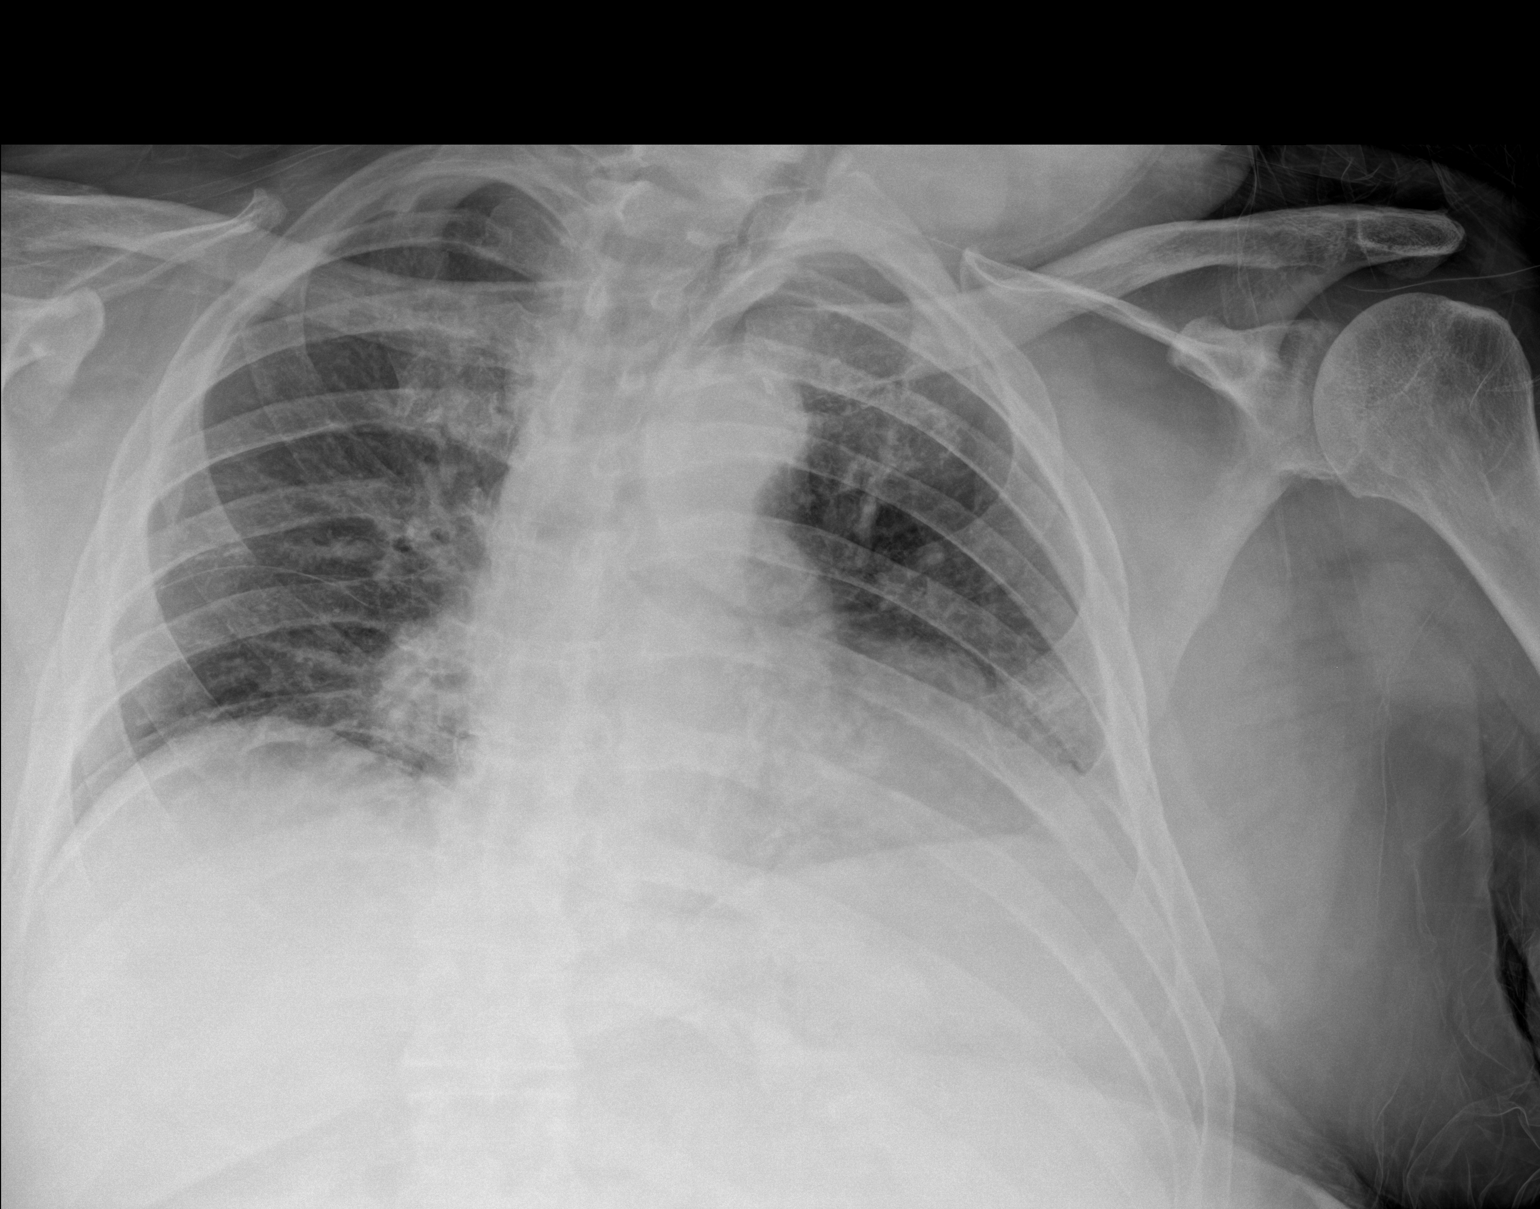

[1 of 1 positions shown; findings below may reference images not displayed]

FINDINGS: Decreased lung volumes are seen which is likely secondary to the
degree of patient inspiration. Very mild atelectasis and/or early
infiltrate is seen within the left lung base. The heart size and
mediastinal contours are within normal limits. Degenerative changes
seen within the thoracic spine.
IMPRESSION: Very mild left basilar atelectasis and/or early infiltrate.

## 2021-04-22 ENCOUNTER — Other Ambulatory Visit: Payer: Self-pay

## 2021-04-22 ENCOUNTER — Emergency Department: Payer: Medicare PPO

## 2021-04-22 ENCOUNTER — Observation Stay
Admission: EM | Admit: 2021-04-22 | Discharge: 2021-04-23 | Disposition: A | Discharge status: Home / Self Care | Payer: Medicare PPO | Attending: Family Medicine | Admitting: Family Medicine

## 2021-04-22 DIAGNOSIS — F0392 Unspecified dementia, unspecified severity, with psychotic disturbance: Secondary | ICD-10-CM | POA: Diagnosis present

## 2021-04-22 DIAGNOSIS — Z79899 Other long term (current) drug therapy: Secondary | ICD-10-CM | POA: Insufficient documentation

## 2021-04-22 DIAGNOSIS — Z7982 Long term (current) use of aspirin: Secondary | ICD-10-CM | POA: Diagnosis not present

## 2021-04-22 DIAGNOSIS — Z20822 Contact with and (suspected) exposure to covid-19: Secondary | ICD-10-CM | POA: Insufficient documentation

## 2021-04-22 DIAGNOSIS — F0150 Vascular dementia without behavioral disturbance: Secondary | ICD-10-CM | POA: Insufficient documentation

## 2021-04-22 DIAGNOSIS — R531 Weakness: Secondary | ICD-10-CM | POA: Diagnosis present

## 2021-04-22 DIAGNOSIS — Z87891 Personal history of nicotine dependence: Secondary | ICD-10-CM | POA: Diagnosis not present

## 2021-04-22 DIAGNOSIS — I639 Cerebral infarction, unspecified: Principal | ICD-10-CM | POA: Diagnosis present

## 2021-04-22 DIAGNOSIS — E876 Hypokalemia: Secondary | ICD-10-CM | POA: Diagnosis present

## 2021-04-22 DIAGNOSIS — R4182 Altered mental status, unspecified: Secondary | ICD-10-CM

## 2021-04-22 DIAGNOSIS — G9340 Encephalopathy, unspecified: Secondary | ICD-10-CM | POA: Diagnosis present

## 2021-04-22 DIAGNOSIS — Z8673 Personal history of transient ischemic attack (TIA), and cerebral infarction without residual deficits: Secondary | ICD-10-CM | POA: Insufficient documentation

## 2021-04-22 DIAGNOSIS — M6281 Muscle weakness (generalized): Secondary | ICD-10-CM | POA: Insufficient documentation

## 2021-04-22 DIAGNOSIS — I10 Essential (primary) hypertension: Secondary | ICD-10-CM | POA: Insufficient documentation

## 2021-04-22 DIAGNOSIS — F0390 Unspecified dementia without behavioral disturbance: Secondary | ICD-10-CM | POA: Diagnosis present

## 2021-04-22 LAB — CBC
HCT: 43.8 % (ref 39.0–52.0)
Hemoglobin: 15.4 g/dL (ref 13.0–17.0)
MCH: 31.7 pg (ref 26.0–34.0)
MCHC: 35.2 g/dL (ref 30.0–36.0)
MCV: 90.1 fL (ref 80.0–100.0)
Platelets: 284 10*3/uL (ref 150–400)
RBC: 4.86 MIL/uL (ref 4.22–5.81)
RDW: 13.4 % (ref 11.5–15.5)
WBC: 8.6 10*3/uL (ref 4.0–10.5)
nRBC: 0 % (ref 0.0–0.2)

## 2021-04-22 LAB — PROTIME-INR
INR: 0.9 (ref 0.8–1.2)
Prothrombin Time: 12.3 seconds (ref 11.4–15.2)

## 2021-04-22 LAB — URINALYSIS, ROUTINE W REFLEX MICROSCOPIC
Bacteria, UA: NONE SEEN
Bilirubin Urine: NEGATIVE
Glucose, UA: NEGATIVE mg/dL
Hgb urine dipstick: NEGATIVE
Leukocytes,Ua: NEGATIVE
Nitrite: NEGATIVE
Protein, ur: NEGATIVE mg/dL
Specific Gravity, Urine: 1.025 (ref 1.005–1.030)
Squamous Epithelial / HPF: NONE SEEN (ref 0–5)
pH: 6.5 (ref 5.0–8.0)

## 2021-04-22 LAB — COMPREHENSIVE METABOLIC PANEL
ALT: 39 U/L (ref 0–44)
AST: 28 U/L (ref 15–41)
Albumin: 3.8 g/dL (ref 3.5–5.0)
Alkaline Phosphatase: 117 U/L (ref 38–126)
Anion gap: 9 (ref 5–15)
BUN: 20 mg/dL (ref 8–23)
CO2: 27 mmol/L (ref 22–32)
Calcium: 9.3 mg/dL (ref 8.9–10.3)
Chloride: 99 mmol/L (ref 98–111)
Creatinine, Ser: 1.04 mg/dL (ref 0.61–1.24)
GFR, Estimated: >60 mL/min (ref >60)
Glucose, Bld: 97 mg/dL (ref 70–99)
Potassium: 3.1 mmol/L — ABNORMAL LOW (ref 3.5–5.1)
Sodium: 135 mmol/L (ref 135–145)
Total Bilirubin: 0.9 mg/dL (ref 0.3–1.2)
Total Protein: 6.6 g/dL (ref 6.5–8.1)

## 2021-04-22 LAB — RESP PANEL BY RT-PCR (FLU A&B, COVID) ARPGX2
Influenza A by PCR: NEGATIVE
Influenza B by PCR: NEGATIVE
SARS Coronavirus 2 by RT PCR: NEGATIVE

## 2021-04-22 LAB — CBG MONITORING, ED: Glucose-Capillary: 134 mg/dL — ABNORMAL HIGH (ref 70–99)

## 2021-04-22 MED ORDER — SODIUM CHLORIDE 0.9 % IV BOLUS
1000.0000 mL | Freq: Once | INTRAVENOUS | Status: AC
  Administered 2021-04-22: 1000 mL via INTRAVENOUS

## 2021-04-22 NOTE — ED Triage Notes (Signed)
Patient's wife reports patient altered when he awoke this morning. Patient last known well at 20:30. Patient unable to follow instructions for wife, worsening throughout the day. Patient reports PMH of stroke.

## 2021-04-22 NOTE — ED Provider Notes (Signed)
[facility] Emergency Department Provider Note   ____________________________________________   I have reviewed the triage vital signs and the nursing notes.   HISTORY  Chief Complaint Altered Mental Status   History limited by: Dementia, history primarily obtained from wife at bedside   HPI Maurice Moses is a 75 y.o. male who presents to the emergency department today accompanied by wife because of concerns for increased weakness and possible increased confusion.  Wife states that she noticed it this morning.  She states that he was having a harder time getting the patient to follow-up commands.  Additionally she noticed that he seemed to be generally.  Patient does have a history of stroke and dementia.  She states that she has not noticed any change recently until today.  She did however spend most of the day waking yesterday although he had a caretaker.  Caretaker did not relay any change in behavior or unusual activity yesterday.  Wife is not noticed any shortness of breath, nausea or vomiting.  Is concerned for possible UTI given the patient is incontinent.    Records reviewed. Per medical record review patient has a history of dementia and stroke.   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[facility])    Depression    Hx of endoscopy 09/02/2012   Hypertension    IBS (irritable bowel syndrome)    S/P colonoscopy 09/02/2012   Stroke (cerebrum) (HCC)    Stroke (HCC)    Subdural hemorrhage [facility])     Patient Active Problem List   Diagnosis Date Noted   Vascular dementia (HCC) 01/27/2020   Sepsis (HCC) 01/24/2020   Stroke (HCC) 01/24/2020   Acute metabolic encephalopathy 01/24/2020   Nausea & vomiting 01/24/2020   Acute respiratory failure with hypoxia (HCC) 01/24/2020   GERD (gastroesophageal reflux disease)    Hypokalemia    CAP (community acquired pneumonia)    Aspiration pneumonia (HCC)    Chronic fatigue 10/30/2019   Abnormal  EKG 09/25/2019   Hyperlipidemia LDL goal <70 09/25/2019   Essential hypertension 09/25/2019   Aphasia 08/14/2019   Recurrent strokes (HCC) 11/26/2016   Cerebral infarction (HCC) 07/24/2015   TIA (transient ischemic attack) 07/23/2015    Past Surgical History:  Procedure Laterality Date   LOOP RECORDER INSERTION      Prior to Admission medications   Medication Sig Start Date End Date Taking? Authorizing Provider  ascorbic acid (VITAMIN C) 500 MG tablet Take 500 mg by mouth daily.    [provider]  aspirin EC 81 MG EC tablet Take 1 tablet (81 mg total) by mouth daily. 07/26/15   Auburn Bilberry, MD  atorvastatin (LIPITOR) 40 MG tablet Take 1 tablet (40 mg total) by mouth daily at 6 PM. 07/26/15   Auburn Bilberry, MD  cetirizine (ZYRTEC) 10 MG tablet Take 10 mg by mouth daily.    [provider]  cholecalciferol (VITAMIN D) 1000 units tablet Take 2,000 Units by mouth daily.    [provider]  clopidogrel (PLAVIX) 75 MG tablet Take 1 tablet (75 mg total) by mouth daily. 07/26/15   Auburn Bilberry, MD  docusate sodium (COLACE) 100 MG capsule Take 300 mg by mouth daily.     [provider]  escitalopram (LEXAPRO) 20 MG tablet Take 20 mg by mouth every evening.     [provider]  esomeprazole (NEXIUM) 40 MG capsule Take 40 mg by mouth every evening.     [provider]  hydrochlorothiazide (  MICROZIDE) 12.5 MG capsule Take 2 capsules (25 mg total) by mouth daily. 01/28/20   Dhungel, Theda Belfast, MD  HYDROcodone-acetaminophen (NORCO/VICODIN) 5-325 MG tablet Take 1 tablet by mouth every 4 (four) hours as needed for moderate pain (Max 7 per day). 01/28/20   Dhungel, Nishant, MD  mirtazapine (REMERON SOL-TAB) 30 MG disintegrating tablet Take 30 mg by mouth daily.     [provider]  Omega-3 1000 MG CAPS Take 1,000 mg by mouth daily.    [provider]  quinapril (ACCUPRIL) 40 MG tablet Take 40 mg by mouth at bedtime.    [provider]    Allergies Morphine and related and Doxycycline  Family History  Problem Relation Age of Onset   Hypertension Other    Parkinson's disease Mother    Stomach cancer Father    Hypertension Father    Parkinson's disease Brother     Social History Social History   Tobacco Use   Smoking status: Former    Packs/day: 1.00    Years: 30.00    Pack years: 30.00    Types: Cigarettes    Quit date: 09/22/1977    Years since quitting: 43.6   Smokeless tobacco: Never  Vaping Use   Vaping Use: Never used  Substance Use Topics   Alcohol use: Yes    Alcohol/week: 2.0 standard drinks    Types: 2 Glasses of wine per week   Drug use: No    Review of Systems Unable to obtain reliable ROS secondary to dementia.   ____________________________________________   PHYSICAL EXAM:  VITAL SIGNS: ED Triage Vitals  Enc Vitals Group     BP 04/22/21 2044 (!) 150/74     Pulse Rate 04/22/21 2044 78     Resp 04/22/21 2044 18     Temp 04/22/21 2044 99.4 F (37.4 C)     Temp Source 04/22/21 2044 Oral     SpO2 04/22/21 2044 96 %     Weight 04/22/21 2113 230 lb (104.3 kg)     Height 04/22/21 2113 5\' 9"  (1.753 m)    Constitutional: Awake and alert.  Eyes: Conjunctivae are normal.  ENT      Head: Normocephalic and atraumatic.      Nose: No congestion/rhinnorhea.      Mouth/Throat: Mucous membranes are moist.      Neck: No stridor. Hematological/Lymphatic/Immunilogical: No cervical lymphadenopathy. Cardiovascular: Normal rate, regular rhythm.  No murmurs, rubs, or gallops.  Respiratory: Normal respiratory effort without tachypnea nor retractions. Breath sounds are clear and equal bilaterally. No wheezes/rales/rhonchi. Gastrointestinal: Soft and non tender. No rebound. No guarding.  Genitourinary: Deferred Musculoskeletal: Normal range of motion in all extremities. No lower extremity edema. Neurologic:  Awake and alert. Follows commands. Answers some yes/no questions.  Skin:   Skin is warm, dry and intact. No rash noted.  ____________________________________________    LABS (pertinent positives/negatives)  CBC wbc 8.6, hgb 15.4, plt 284 CMP wnl except k 3.1 ____________________________________________   EKG  I, , attending physician, personally viewed and interpreted this EKG  EKG Time: 2126 Rate: 97 Rhythm: sinus rhythm with 1st degree av block Axis: normal Intervals: qtc 454 QRS: narrow, q waves III, aVF, v1 ST changes: no st elevation Impression: abnormal ekg ____________________________________________    RADIOLOGY  CT head Old cerebellum infarcts, no acute abnormality.  CT cervical spine No acute abnormality  ____________________________________________   PROCEDURES  Procedures  ____________________________________________   INITIAL IMPRESSION / ASSESSMENT AND PLAN / ED COURSE  Pertinent  labs & imaging results that were available during my care of the patient were reviewed by me and considered in my medical decision making (see chart for details).  Patient presented to the emergency department today because of concerns for increased weakness and some confusion today.  Would certainly consider dehydration and urinary tract infection high on my differential.  Discussed this with the wife.  Did additionally discuss possibility of stroke.  Wife would like to try to get patient home tonight if possible.  Will start IV fluids.   We did discuss possibility of new stroke however this time wife appreciates that there would not necessarily be much that we could do at this point.  She does appear to be comfortable with idea of taking patient home.  Hopefully he will recover somewhat with IV hydration. Awaiting urine and reassessment at time of signout.  ____________________________________________   FINAL CLINICAL IMPRESSION(S) / ED DIAGNOSES  Weakness    Note: This dictation was prepared with Dragon dictation. Any  transcriptional errors that result from this process are unintentional     Phineas Semen, MD 04/22/21 2328

## 2021-04-22 NOTE — ED Notes (Signed)
Patient's wife reports patient had increased difficulty walking this morning and fell to floor.

## 2021-04-22 NOTE — ED Notes (Signed)
Pt gone to CT 

## 2021-04-22 NOTE — ED Notes (Signed)
Patient to waiting room via wheelchair by EMS from home.  Per EMS patient with altered mental status that was notices last night.  Family reported when changing depends that urine was darker than usual and concerned could be a UTI.  EMS interventions -- stroke screen negative, cbg 162, pulse 74, bp 170/102, pulse oxi 98% on room air.

## 2021-04-23 ENCOUNTER — Observation Stay
Admit: 2021-04-23 | Discharge: 2021-04-23 | Disposition: A | Discharge status: Home / Self Care | Payer: Medicare PPO | Attending: Family Medicine | Admitting: Family Medicine

## 2021-04-23 ENCOUNTER — Observation Stay: Payer: Medicare PPO

## 2021-04-23 ENCOUNTER — Encounter: Payer: Self-pay | Admitting: Family Medicine

## 2021-04-23 DIAGNOSIS — F0150 Vascular dementia without behavioral disturbance: Secondary | ICD-10-CM | POA: Diagnosis not present

## 2021-04-23 DIAGNOSIS — G9340 Encephalopathy, unspecified: Secondary | ICD-10-CM | POA: Diagnosis present

## 2021-04-23 DIAGNOSIS — I10 Essential (primary) hypertension: Secondary | ICD-10-CM

## 2021-04-23 DIAGNOSIS — E876 Hypokalemia: Secondary | ICD-10-CM | POA: Diagnosis not present

## 2021-04-23 DIAGNOSIS — G459 Transient cerebral ischemic attack, unspecified: Secondary | ICD-10-CM

## 2021-04-23 LAB — AMMONIA: Ammonia: 19 umol/L (ref 9–35)

## 2021-04-23 LAB — BASIC METABOLIC PANEL
Anion gap: 5 (ref 5–15)
BUN: 19 mg/dL (ref 8–23)
CO2: 29 mmol/L (ref 22–32)
Calcium: 8.6 mg/dL — ABNORMAL LOW (ref 8.9–10.3)
Chloride: 102 mmol/L (ref 98–111)
Creatinine, Ser: 0.94 mg/dL (ref 0.61–1.24)
GFR, Estimated: >60 mL/min (ref >60)
Glucose, Bld: 108 mg/dL — ABNORMAL HIGH (ref 70–99)
Potassium: 3.2 mmol/L — ABNORMAL LOW (ref 3.5–5.1)
Sodium: 136 mmol/L (ref 135–145)

## 2021-04-23 LAB — CBC
HCT: 38.5 % — ABNORMAL LOW (ref 39.0–52.0)
Hemoglobin: 13.8 g/dL (ref 13.0–17.0)
MCH: 31.9 pg (ref 26.0–34.0)
MCHC: 35.8 g/dL (ref 30.0–36.0)
MCV: 89.1 fL (ref 80.0–100.0)
Platelets: 218 10*3/uL (ref 150–400)
RBC: 4.32 MIL/uL (ref 4.22–5.81)
RDW: 13.2 % (ref 11.5–15.5)
WBC: 8.4 10*3/uL (ref 4.0–10.5)
nRBC: 0 % (ref 0.0–0.2)

## 2021-04-23 LAB — MAGNESIUM: Magnesium: 2 mg/dL (ref 1.7–2.4)

## 2021-04-23 LAB — VITAMIN B12: Vitamin B-12: 873 pg/mL (ref 180–914)

## 2021-04-23 LAB — RPR: RPR Ser Ql: NONREACTIVE

## 2021-04-23 MED ORDER — STROKE: EARLY STAGES OF RECOVERY BOOK: Freq: Once | Status: AC

## 2021-04-23 MED ORDER — ENOXAPARIN SODIUM 60 MG/0.6ML IJ SOSY
0.5000 mg/kg | PREFILLED_SYRINGE | INTRAMUSCULAR | Status: DC
  Administered 2021-04-23: 11:00:00 52.5 mg via SUBCUTANEOUS
  Filled 2021-04-23: qty 0.6

## 2021-04-23 MED ORDER — ACETAMINOPHEN 650 MG RE SUPP: 650.0000 mg | Freq: Four times a day (QID) | RECTAL | Status: DC | PRN

## 2021-04-23 MED ORDER — SENNOSIDES-DOCUSATE SODIUM 8.6-50 MG PO TABS: 1.0000 | ORAL_TABLET | Freq: Every evening | ORAL | Status: DC | PRN

## 2021-04-23 MED ORDER — CLOPIDOGREL BISULFATE 75 MG PO TABS
75.0000 mg | ORAL_TABLET | Freq: Every day | ORAL | Status: DC
  Administered 2021-04-23: 75 mg via ORAL
  Filled 2021-04-23: qty 1

## 2021-04-23 MED ORDER — ACETAMINOPHEN 325 MG PO TABS: 650.0000 mg | ORAL_TABLET | Freq: Four times a day (QID) | ORAL | Status: DC | PRN

## 2021-04-23 MED ORDER — LISINOPRIL 20 MG PO TABS: 40.0000 mg | ORAL_TABLET | Freq: Every day | ORAL | Status: DC

## 2021-04-23 MED ORDER — ATORVASTATIN CALCIUM 20 MG PO TABS
40.0000 mg | ORAL_TABLET | Freq: Every day | ORAL | Status: DC
  Administered 2021-04-23: 11:00:00 40 mg via ORAL
  Filled 2021-04-23: qty 2

## 2021-04-23 MED ORDER — SODIUM CHLORIDE 0.9 % IV SOLN: INTRAVENOUS | Status: DC

## 2021-04-23 MED ORDER — ONDANSETRON HCL 4 MG/2ML IJ SOLN: 4.0000 mg | Freq: Four times a day (QID) | INTRAMUSCULAR | Status: DC | PRN

## 2021-04-23 MED ORDER — PERFLUTREN LIPID MICROSPHERE
1.0000 mL | INTRAVENOUS | Status: AC | PRN
  Administered 2021-04-23: 3 mL via INTRAVENOUS
  Filled 2021-04-23: qty 10

## 2021-04-23 MED ORDER — POTASSIUM CHLORIDE CRYS ER 20 MEQ PO TBCR
20.0000 meq | EXTENDED_RELEASE_TABLET | Freq: Once | ORAL | Status: AC
  Administered 2021-04-23: 04:00:00 20 meq via ORAL
  Filled 2021-04-23: qty 1

## 2021-04-23 MED ORDER — POTASSIUM CHLORIDE 10 MEQ/100ML IV SOLN
10.0000 meq | INTRAVENOUS | Status: AC
Start: 2021-04-23 — End: 2021-04-23
  Administered 2021-04-23 (×2): 10 meq via INTRAVENOUS
  Filled 2021-04-23: qty 100

## 2021-04-23 MED ORDER — ONDANSETRON HCL 4 MG PO TABS: 4.0000 mg | ORAL_TABLET | Freq: Four times a day (QID) | ORAL | Status: DC | PRN

## 2021-04-23 NOTE — Progress Notes (Signed)
*  PRELIMINARY RESULTS* Echocardiogram 2D Echocardiogram has been performed. Definity IV Contrast used on this study.  Maurice Moses 04/23/2021, 11:45 AM

## 2021-04-23 NOTE — Progress Notes (Signed)
Patient admitted to 1C at 0400 this AM. Oriented to self only. Wife at bedside. Patient able to follow commands. Oriented to room and call bell. All questions/concerns addressed at this time.

## 2021-04-23 NOTE — H&P (Signed)
History and Physical    Maurice Moses WYO:[id number] DOB: 09-29-1945 DOA: 04/22/2021  PCP: System, Provider Not In   Patient coming from: Home   Chief Complaint: Increased weakness and confusion   HPI: Maurice Moses is a 75 y.o. male with medical history significant for multiple strokes, vascular dementia, patient reportedly seemed to be in his usual state of health on 04/21/2021 but then had increased weakness and confusion noted on waking the following morning.  At his baseline, patient uses a wheelchair, occasionally a walker, but requires assistance from his wife to stand up to the walker.  She also dresses the patient and helps him with toileting.  He has baseline expressive aphasia but usually seems to understand what is said to him.  He spends most of his days in a chair at baseline.  Over the past day, his wife has had a lot of difficulty getting him to stand and he seems to not understand what is being said to him much of the time.  No new focal weakness has been noted and the patient has not been complaining of anything. No seizure activity noted.   ED Course: Upon arrival to the ED, patient is found to be afebrile, saturating mid 90s on room air, and with stable blood pressure.  EKG features sinus rhythm.  No acute abnormalities noted on CT cervical spine and CT head.  Chemistry panel with potassium 3.1.  CBC unremarkable.  Urinalysis with trace ketones.  The patient was given a liter of saline.  COVID and influenza PCR are negative.  MRI brain was ordered but not yet performed.  Review of Systems:  Unable to complete ROS secondary the patient's clinical condition.  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(HCC)    Depression    Hx of endoscopy 09/02/2012   Hypertension    IBS (irritable bowel syndrome)    S/P colonoscopy 09/02/2012   Stroke (cerebrum) (HCC)    Stroke (HCC)    Subdural hemorrhage (HCC)     Past Surgical History:  Procedure Laterality Date    LOOP RECORDER INSERTION      Social History:   reports that he quit smoking about 43 years ago. His smoking use included cigarettes. He has a 30.00 pack-year smoking history. He has never used smokeless tobacco. He reports current alcohol use of about 2.0 standard drinks per week. He reports that he does not use drugs.  Allergies  Allergen Reactions   Morphine And Related Other (See Comments)    Agitation - wife does not want him to have it   Doxycycline Nausea And Vomiting    Family History  Problem Relation Age of Onset   Hypertension Other    Parkinson's disease Mother    Stomach cancer Father    Hypertension Father    Parkinson's disease Brother      Prior to Admission medications   Medication Sig Start Date End Date Taking? Authorizing Provider  ascorbic acid (VITAMIN C) 500 MG tablet Take 500 mg by mouth daily.    [provider]  aspirin EC 81 MG EC tablet Take 1 tablet (81 mg total) by mouth daily. 07/26/15   Auburn Bilberry, MD  atorvastatin (LIPITOR) 40 MG tablet Take 1 tablet (40 mg total) by mouth daily at 6 PM. 07/26/15   Auburn Bilberry, MD  cetirizine (ZYRTEC) 10 MG tablet Take 10 mg by mouth daily.    [provider]  cholecalciferol (VITAMIN D) 1000 units tablet Take  2,000 Units by mouth daily.    [provider]  clopidogrel (PLAVIX) 75 MG tablet Take 1 tablet (75 mg total) by mouth daily. 07/26/15   Auburn BilberryPatel, Shreyang, MD  docusate sodium (COLACE) 100 MG capsule Take 300 mg by mouth daily.     [provider]  escitalopram (LEXAPRO) 20 MG tablet Take 20 mg by mouth every evening.     [provider]  esomeprazole (NEXIUM) 40 MG capsule Take 40 mg by mouth every evening.     [provider]  hydrochlorothiazide (MICROZIDE) 12.5 MG capsule Take 2 capsules (25 mg total) by mouth daily. 01/28/20   Dhungel, Theda BelfastNishant, MD  HYDROcodone-acetaminophen (NORCO/VICODIN) 5-325 MG tablet Take 1 tablet by mouth every 4 (four) hours  as needed for moderate pain (Max 7 per day). 01/28/20   Dhungel, Nishant, MD  mirtazapine (REMERON SOL-TAB) 30 MG disintegrating tablet Take 30 mg by mouth daily.     [provider]  Omega-3 1000 MG CAPS Take 1,000 mg by mouth daily.    [provider]  quinapril (ACCUPRIL) 40 MG tablet Take 40 mg by mouth at bedtime.    [provider]    Physical Exam: Vitals:   04/22/21 2113 04/22/21 2230 04/22/21 2300 04/23/21 0103  BP:  (!) 163/102 (!) 172/125 (!) 172/83  Pulse:  77 80 71  Resp:  20 17 18   Temp:    99.1 F (37.3 C)  TempSrc:    Oral  SpO2:  95% 96% 99%  Weight: 104.3 kg     Height: 5\' 9"  (1.753 m)       Constitutional: NAD, calm  Eyes: PERTLA, lids and conjunctivae normal ENMT: Mucous membranes are moist. Posterior pharynx clear of any exudate or lesions.   Neck:  supple, no masses   Respiratory: no wheezing, no crackles. No accessory muscle use.  Cardiovascular: S1 & S2 heard, regular rate and rhythm. No extremity edema.   Abdomen: No distension, no tenderness, soft. Bowel sounds active.  Musculoskeletal: no clubbing / cyanosis. No joint deformity upper and lower extremities.   Skin: no significant rashes, lesions, ulcers. Warm, dry, well-perfused. Neurologic: No gross facial asymmetry. Expressive aphasia. No dysarthria. Sensation intact. Moving all extremities, not cooperating with strength testing of distal RLE but 5/5 elsewhere.   Psychiatric: Alert and intermittently oriented to person and place. Calm.    Labs and Imaging on Admission: I have personally reviewed following labs and imaging studies  CBC: Recent Labs  Lab 04/22/21 2055  WBC 8.6  HGB 15.4  HCT 43.8  MCV 90.1  PLT 284   Basic Metabolic Panel: Recent Labs  Lab 04/22/21 2055  NA 135  K 3.1*  CL 99  CO2 27  GLUCOSE 97  BUN 20  CREATININE 1.04  CALCIUM 9.3   GFR: Estimated Creatinine Clearance: 71.9 mL/min (by C-G formula based on SCr of 1.04 mg/dL). Liver  Function Tests: Recent Labs  Lab 04/22/21 2055  AST 28  ALT 39  ALKPHOS 117  BILITOT 0.9  PROT 6.6  ALBUMIN 3.8   No results for input(s): LIPASE, AMYLASE in the last 168 hours. No results for input(s): AMMONIA in the last 168 hours. Coagulation Profile: Recent Labs  Lab 04/22/21 2055  INR 0.9   Cardiac Enzymes: No results for input(s): CKTOTAL, CKMB, CKMBINDEX, TROPONINI in the last 168 hours. BNP (last 3 results) No results for input(s): PROBNP in the last 8760 hours. HbA1C: No results for input(s): HGBA1C in the last 72 hours.  CBG: Recent Labs  Lab 04/22/21 2130  GLUCAP 134*   Lipid Profile: No results for input(s): CHOL, HDL, LDLCALC, TRIG, CHOLHDL, LDLDIRECT in the last 72 hours. Thyroid Function Tests: No results for input(s): TSH, T4TOTAL, FREET4, T3FREE, THYROIDAB in the last 72 hours. Anemia Panel: No results for input(s): VITAMINB12, FOLATE, FERRITIN, TIBC, IRON, RETICCTPCT in the last 72 hours. Urine analysis:    Component Value Date/Time   COLORURINE YELLOW 04/22/2021 2024   APPEARANCEUR CLEAR 04/22/2021 2024   LABSPEC 1.025 04/22/2021 2024   PHURINE 6.5 04/22/2021 2024   GLUCOSEU NEGATIVE 04/22/2021 2024   HGBUR NEGATIVE 04/22/2021 2024   BILIRUBINUR NEGATIVE 04/22/2021 2024   KETONESUR TRACE (A) 04/22/2021 2024   PROTEINUR NEGATIVE 04/22/2021 2024   NITRITE NEGATIVE 04/22/2021 2024   LEUKOCYTESUR NEGATIVE 04/22/2021 2024   Sepsis Labs: @LABRCNTIP (procalcitonin:4,lacticidven:4) ) Recent Results (from the past 240 hour(s))  Resp Panel by RT-PCR (Flu A&B, Covid) Nasopharyngeal Swab     Status: None   Collection Time: 04/22/21 10:45 PM   Specimen: Nasopharyngeal Swab; Nasopharyngeal(NP) swabs in vial transport medium  Result Value Ref Range Status   SARS Coronavirus 2 by RT PCR NEGATIVE NEGATIVE Final    Comment: (NOTE) SARS-CoV-2 target nucleic acids are NOT DETECTED.  The SARS-CoV-2 RNA is generally detectable in upper respiratory specimens  during the acute phase of infection. The lowest concentration of SARS-CoV-2 viral copies this assay can detect is 138 copies/mL. A negative result does not preclude SARS-Cov-2 infection and should not be used as the sole basis for treatment or other patient management decisions. A negative result may occur with  improper specimen collection/handling, submission of specimen other than nasopharyngeal swab, presence of viral mutation(s) within the areas targeted by this assay, and inadequate number of viral copies(<138 copies/mL). A negative result must be combined with clinical observations, patient history, and epidemiological information. The expected result is Negative.  Fact Sheet for Patients:  06/22/21  Fact Sheet for Healthcare Providers:  BloggerCourse.com  This test is no t yet approved or cleared by the [employer] FDA and  has been authorized for detection and/or diagnosis of SARS-CoV-2 by FDA under an Emergency Use Authorization (EUA). This EUA will remain  in effect (meaning this test can be used) for the duration of the COVID-19 declaration under Section 564(b)(1) of the Act, 21 U.S.C.section 360bbb-3(b)(1), unless the authorization is terminated  or revoked sooner.       Influenza A by PCR NEGATIVE NEGATIVE Final   Influenza B by PCR NEGATIVE NEGATIVE Final    Comment: (NOTE) The Xpert Xpress SARS-CoV-2/FLU/RSV plus assay is intended as an aid in the diagnosis of influenza from Nasopharyngeal swab specimens and should not be used as a sole basis for treatment. Nasal washings and aspirates are unacceptable for Xpert Xpress SARS-CoV-2/FLU/RSV testing.  Fact Sheet for Patients: [place]  Fact Sheet for Healthcare Providers: BloggerCourse.com  This test is not yet approved or cleared by the [employer] FDA and has been authorized for detection  and/or diagnosis of SARS-CoV-2 by FDA under an Emergency Use Authorization (EUA). This EUA will remain in effect (meaning this test can be used) for the duration of the COVID-19 declaration under Section 564(b)(1) of the Act, 21 U.S.C. section 360bbb-3(b)(1), unless the authorization is terminated or revoked.  Performed at [facility], [address], [place], [place]      Radiological Exams on Admission: CT HEAD WO CONTRAST ([id number])  Result Date: 04/22/2021 CLINICAL DATA:  Altered mental status EXAM:  CT HEAD WITHOUT CONTRAST TECHNIQUE: Contiguous axial images were obtained from the base of the skull through the vertex without intravenous contrast. COMPARISON:  12/22/2019 FINDINGS: Brain: Old bilateral cerebellar infarcts. There is atrophy and chronic small vessel disease changes. Associated ventriculomegaly, stable. There is atrophy and chronic small vessel disease changes. No acute intracranial abnormality. Specifically, no hemorrhage, hydrocephalus, mass lesion, acute infarction, or significant intracranial injury. Vascular: No hyperdense vessel or unexpected calcification. Skull: No acute calvarial abnormality. Sinuses/Orbits: No acute findings Other: None IMPRESSION: Old bilateral cerebellar infarcts. Atrophy, chronic microvascular disease. No acute intracranial abnormality. Electronically Signed   By: Charlett Nose M.D.   On: 04/22/2021 21:59   CT Cervical Spine Wo Contrast  Result Date: 04/22/2021 CLINICAL DATA:  Head trauma, minor (Age >= 65y). Altered mental status EXAM: CT CERVICAL SPINE WITHOUT CONTRAST TECHNIQUE: Multidetector CT imaging of the cervical spine was performed without intravenous contrast. Multiplanar CT image reconstructions were also generated. COMPARISON:  None. FINDINGS: Alignment: No subluxation. Skull base and vertebrae: No acute fracture. No primary bone lesion or focal pathologic process. Soft tissues and spinal canal: No prevertebral fluid or  swelling. No visible canal hematoma. Disc levels: Diffuse degenerative disc disease with disc space narrowing and spurring. Degenerative facet disease bilaterally. Upper chest: No acute findings Other: None IMPRESSION: Diffuse degenerative disc and facet disease. No acute bony abnormality. Electronically Signed   By: Charlett Nose M.D.   On: 04/22/2021 22:02    EKG: Independently reviewed. Sinus rhythm, 1st degree AV block.   Assessment/Plan   1. Acute encephalopathy  - Patient with hx of CVAs and vascular dementia presents with one day of increased confusion, general weakness, and fatigue  - No acute findings on head CT in ED, UA with ketones only, and basic bloodwork with hypokalemia  - He seems to have improved a little after a liter of NS per wife, but still seems more confused and general weak than usual  - TSH was normal 3 days ago  - MRI brain is pending, plan to also check ammonia, B12, and RPR while holding sedating medications, continuing IVF hydration and supportive care    2. Hx of CVAs, vascular dementia  - Continue Lipitor and Plavix, follow-up MRI, use delirium precautions while in hospital    3. Hypertension  - Continue ACE, hold HCTZ while hydrating    4. Hypokalemia  - Replace potassium    DVT prophylaxis: Lovenox  Code Status: DNR  Level of Care: Level of care: Med-Surg Family Communication: Wife updated at bedside  Disposition Plan:  Patient is from: Home  Anticipated d/c is to: TBD Anticipated d/c date is: Possibly as early as 04/24/21 Patient currently: Pending MRI brain, additional labs, improvement in mental status and weakness or PT consult  Consults called: None  Admission status: Observation     Briscoe Deutscher, MD [facility]  04/23/2021, 2:40 AM

## 2021-04-23 NOTE — ED Notes (Signed)
hospitalist in to see pt, pt now going to MRI

## 2021-04-23 NOTE — Consult Note (Signed)
Neurology Consultation Reason for Consult: Stroke Referring Physician: Maryfrances Bunnell, C  CC: Encephalopathy  History is obtained from: Chart review  HPI: Maurice Moses is a 75 y.o. male with a history of multiple strokes, vascular dementia who has been having increasing weakness over the past couple of days.  He requires significant help, including with dressing and toileting.  Over the past day he has had increasing confusion and difficulty standing.  Due to her not being able to get them up, she brought into the emergency department to be evaluated.  In the ER, he received IV fluids, and has had significant improvement with the IV fluids.  He also had an MRI which shows a small ischemic stroke and it is for this and neurology has been consulted.  Of note, he has been eating all right, but has not been drinking very much liquid lately.  LKW: Unclear tpa given?: no, unclear time of onset  ROS: A 14 point ROS was performed and is negative except as noted in the HPI.   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(HCC)    Depression    Hx of endoscopy 09/02/2012   Hypertension    IBS (irritable bowel syndrome)    S/P colonoscopy 09/02/2012   Stroke (cerebrum) (HCC)    Stroke (HCC)    Subdural hemorrhage (HCC)      Family History  Problem Relation Age of Onset   Hypertension Other    Parkinson's disease Mother    Stomach cancer Father    Hypertension Father    Parkinson's disease Brother      Social History:  reports that he quit smoking about 43 years ago. His smoking use included cigarettes. He has a 30.00 pack-year smoking history. He has never used smokeless tobacco. He reports current alcohol use of about 2.0 standard drinks per week. He reports that he does not use drugs.   Exam: Current vital signs: BP (!) 182/80 (BP Location: Right Wrist)   Pulse 60   Temp 98.5 F (36.9 C)   Resp 16   Ht 5\' 9"  (1.753 m)   Wt 105.9 kg   SpO2 98%   BMI 34.48 kg/m   Vital signs in last 24 hours: Temp:  [98.5 F (36.9 C)-99.4 F (37.4 C)] 98.5 F (36.9 C) (09/04 1111) Pulse Rate:  [60-80] 60 (09/04 1111) Resp:  [16-20] 16 (09/04 1111) BP: (147-182)/(74-125) 182/80 (09/04 1111) SpO2:  [95 %-99 %] 98 % (09/04 1111) Weight:  [104.3 kg-105.9 kg] 105.9 kg (09/04 0345)   Physical Exam  Constitutional: Appears well-developed and well-nourished.  Psych: Affect appropriate to situation Eyes: No scleral injection HENT: No OP obstruction MSK: no joint deformities.  Cardiovascular: Normal rate and regular rhythm.  Respiratory: Effort normal, non-labored breathing GI: Soft.  No distension. There is no tenderness.  Skin: WDI  Neuro: Mental Status: Patient is awake, alert, oriented to person, place, month, he gives the hospital as "[facility]" and is unable to give the year. Cranial Nerves: II: Visual Fields are full. Pupils are equal, round, and reactive to light.   III,IV, VI: EOMI without ptosis or diploplia.  V: Facial sensation is symmetric to temperature VII: Facial movement is symmetric.  VIII: hearing is intact to voice X: Uvula elevates symmetrically XI: Shoulder shrug is symmetric. XII: tongue is midline without atrophy or fasciculations.  Motor: Tone is normal. Bulk is normal. 5/5 strength was present in all four extremities.  Sensory: Sensation is symmetric to light touch and  temperature in the arms and legs. Deep Tendon Reflexes: 2+ and symmetric in the biceps and patellae.  No clonus Cerebellar: No clear ataxia on finger-nose-finger    I have reviewed labs in epic and the results pertinent to this consultation are: RPR nonreactive B12 873 Ammonia 19 CBC-no anemia or leukocytosis  Sodium 136 Calcium 8.6 Creatinine 0.94  UA-negative  I have reviewed the images obtained: MRI brain-likely small vessel ischemic stroke  Impression: 75 year old male who presented with increasing generalized weakness and altered mental status which  has improved with IV hydration.  I suspect that he was dehydrated.  The infarct seen on MRI is likely incidental, I suspect represents decompensated small vessel disease in the setting of dehydration.  It does not appear embolic, and therefore since family is requesting to limit their work-up, I feel that it is reasonable to not pursue echo/CTA.  He is already on high intensity statin at 40 mg of atorvastatin daily, but there is room to increase this if his LDL is greater than 70.  This would be a worthwhile intervention if his LDL comes back greater than 70.  Of note, with his progressive decline described by his wife, I suspect that he does have a progressive component to his dementia such as Alzheimer's, though vascular is also possible.  Recommendations: 1) continue aspirin and Plavix 2) high intensity statin (atorvastatin 40 mg currently), increase if LDL greater than 70 3) PT, OT, ST 4) neurology will be available on an as-needed basis, please call with questions or concerns.    Ritta Slot, MD [facility] [phone]  If 7pm- 7am, please page neurology on call as listed in AMION.

## 2021-04-23 NOTE — Progress Notes (Signed)
Anticoagulation monitoring(Lovenox):  75 yo male ordered Lovenox 40 mg Q24h    Filed Weights   04/22/21 2113  Weight: 104.3 kg (230 lb)   BMI 33.97    Lab Results  Component Value Date   CREATININE 1.04 04/22/2021   CREATININE 1.00 01/27/2020   CREATININE 1.08 01/25/2020   Estimated Creatinine Clearance: 71.9 mL/min (by C-G formula based on SCr of 1.04 mg/dL). Hemoglobin & Hematocrit     Component Value Date/Time   HGB 15.4 04/22/2021 2055   HCT 43.8 04/22/2021 2055     Per Protocol for Patient with estCrcl > 30 ml/min and BMI > 30, will transition to Lovenox 52.5 mg Q24h.

## 2021-04-23 NOTE — Discharge Summary (Signed)
Physician Discharge Summary  Maurice Moses ELF:[id number] DOB: 1946/03/14 DOA: 04/22/2021  PCP: System, Provider Not In  Admit date: 04/22/2021 Discharge date: 04/23/2021  Admitted From: Home  Disposition:  Home with [facility]   Recommendations for Outpatient Follow-up:  Follow up with Dr. Elisabeth Pigeon PCP in 1 week Dr. Elisabeth Pigeon: Please obtain an LDL and increase Lipitor dose if LDL >70      Home Health: PT/OT due to balance issues  Equipment/Devices: None new, has wheelchair, walker, getting lift chair this week  Discharge Condition: Fair  CODE STATUS: DNR Diet recommendation: Cardiac  Brief/Interim Summary: Maurice Moses is a 75 y.o.  M with hx CVA, vascular dementia, HTN who presented with weakness and confusion progressive over 1-2 days.  In the ER, CBC unremarkable.  COVID and flu negative.  CT head normal.  MRI brain showed a small stroke.     PRINCIPAL HOSPITAL DIAGNOSIS: Acute ischemic stroke    Discharge Diagnoses:   Acute ischemic stroke MRI brain showed a punctate right parietal subcortical infarct.  Likely small vessel disease due to high blood pressure.  Family have requested that we avoid low yield procedures, and so echo and CTA head and neck were recommended, but deferred.  -Lipids to be ordered today -Aspirin continued on admission -Atrial fibrillation: not present monitoring -tPA not given because outside the window -PT eval ordered: recommended SNF, family declined, we will discharge to home with [facility]    Hypertension Dementia           Discharge Instructions  Discharge Instructions     Diet - low sodium heart healthy   Complete by: As directed    Discharge instructions   Complete by: As directed    From Dr. Maryfrances Bunnell: You were admitted for a stroke Thankfully this was a small one.   Go see Dr. Elisabeth Pigeon in 1 week and have him check your cholesterol  Go see your Neurologist in 4-6 weeks  Continue your aspirin, Crestor, Plavix and blood pressure  medicines   Increase activity slowly   Complete by: As directed       Allergies as of 04/23/2021       Reactions   Morphine And Related Other (See Comments)   Agitation - wife does not want him to have it   Doxycycline Nausea And Vomiting        Medication List     TAKE these medications    ascorbic acid 500 MG tablet Commonly known as: VITAMIN C Take 500 mg by mouth daily.   aspirin 81 MG EC tablet Take 1 tablet (81 mg total) by mouth daily.   atorvastatin 40 MG tablet Commonly known as: LIPITOR Take 1 tablet (40 mg total) by mouth daily at 6 PM.   cetirizine 10 MG tablet Commonly known as: ZYRTEC Take 10 mg by mouth daily.   cholecalciferol 1000 units tablet Commonly known as: VITAMIN D Take 2,000 Units by mouth daily.   clopidogrel 75 MG tablet Commonly known as: PLAVIX Take 1 tablet (75 mg total) by mouth daily.   docusate sodium 100 MG capsule Commonly known as: COLACE Take 300 mg by mouth daily.   escitalopram 20 MG tablet Commonly known as: LEXAPRO Take 20 mg by mouth every evening.   esomeprazole 40 MG capsule Commonly known as: NEXIUM Take 40 mg by mouth every evening.   hydrochlorothiazide 12.5 MG capsule Commonly known as: MICROZIDE Take 2 capsules (25 mg total) by mouth daily.   HYDROcodone-acetaminophen 5-325 MG tablet Commonly known  as: NORCO/VICODIN Take 1 tablet by mouth every 4 (four) hours as needed for moderate pain (Max 7 per day).   mirtazapine 30 MG disintegrating tablet Commonly known as: REMERON SOL-TAB Take 30 mg by mouth daily.   Omega-3 1000 MG Caps Take 1,000 mg by mouth daily.   quinapril 40 MG tablet Commonly known as: ACCUPRIL Take 40 mg by mouth at bedtime.        Follow-up Information     Altamese Dilling, MD. Schedule an appointment as soon as possible for a visit in 1 week(s).   Specialty: Hospitalist Why: And get a fasting LDL Contact information: [address] [place]  [address] [phone]         [facility]. Schedule an appointment as soon as possible for a visit in 1 month(s).                 Allergies  Allergen Reactions   Morphine And Related Other (See Comments)    Agitation - wife does not want him to have it   Doxycycline Nausea And Vomiting    Consultations: Neurology   Procedures/Studies: CT HEAD WO CONTRAST ( )  Result Date: 04/22/2021 CLINICAL DATA:  Altered mental status EXAM: CT HEAD WITHOUT CONTRAST TECHNIQUE: Contiguous axial images were obtained from the base of the skull through the vertex without intravenous contrast. COMPARISON:  12/22/2019 FINDINGS: Brain: Old bilateral cerebellar infarcts. There is atrophy and chronic small vessel disease changes. Associated ventriculomegaly, stable. There is atrophy and chronic small vessel disease changes. No acute intracranial abnormality. Specifically, no hemorrhage, hydrocephalus, mass lesion, acute infarction, or significant intracranial injury. Vascular: No hyperdense vessel or unexpected calcification. Skull: No acute calvarial abnormality. Sinuses/Orbits: No acute findings Other: None IMPRESSION: Old bilateral cerebellar infarcts. Atrophy, chronic microvascular disease. No acute intracranial abnormality. Electronically Signed   By: Charlett Nose M.D.   On: 04/22/2021 21:59   CT Cervical Spine Wo Contrast  Result Date: 04/22/2021 CLINICAL DATA:  Head trauma, minor (Age >= 65y). Altered mental status EXAM: CT CERVICAL SPINE WITHOUT CONTRAST TECHNIQUE: Multidetector CT imaging of the cervical spine was performed without intravenous contrast. Multiplanar CT image reconstructions were also generated. COMPARISON:  None. FINDINGS: Alignment: No subluxation. Skull base and vertebrae: No acute fracture. No primary bone lesion or focal pathologic process. Soft tissues and spinal canal: No prevertebral fluid or swelling. No visible canal hematoma. Disc levels: Diffuse degenerative disc  disease with disc space narrowing and spurring. Degenerative facet disease bilaterally. Upper chest: No acute findings Other: None IMPRESSION: Diffuse degenerative disc and facet disease. No acute bony abnormality. Electronically Signed   By: Charlett Nose M.D.   On: 04/22/2021 22:02   MR BRAIN WO CONTRAST  Result Date: 04/23/2021 CLINICAL DATA:  Encephalopathy EXAM: MRI HEAD WITHOUT CONTRAST TECHNIQUE: Multiplanar, multiecho pulse sequences of the brain and surrounding structures were obtained without intravenous contrast. COMPARISON:  12/22/2019 FINDINGS: Brain: Punctate focus abnormal diffusion restriction in the posterior right parietal lobe. No acute or chronic hemorrhage. Old left-greater-than-right cerebellar infarcts. Generalized volume loss is unchanged. The midline structures are normal. Vascular: Major flow voids are preserved. Skull and upper cervical spine: Normal calvarium and skull base. Visualized upper cervical spine and soft tissues are normal. Sinuses/Orbits:No paranasal sinus fluid levels or advanced mucosal thickening. No mastoid or middle ear effusion. Normal orbits. IMPRESSION: 1. Punctate focus of acute ischemia in the posterior right parietal lobe. No hemorrhage or mass effect. 2. Old left-greater-than-right cerebellar infarcts. Electronically Signed   By: Caryn Bee  Chase Picket M.D.   On: 04/23/2021 03:07      Subjective: No complaints.  No fever overnight.  No new weakness.  Discharge Exam: Vitals:   04/23/21 0345 04/23/21 1111  BP: (!) 147/92 (!) 182/80  Pulse:  60  Resp: 17 16  Temp: 98.9 F (37.2 C) 98.5 F (36.9 C)  SpO2: 95% 98%   Vitals:   04/22/21 2300 04/23/21 0103 04/23/21 0345 04/23/21 1111  BP: (!) 172/125 (!) 172/83 (!) 147/92 (!) 182/80  Pulse: 80 71  60  Resp: 17 18 17 16   Temp:  99.1 F (37.3 C) 98.9 F (37.2 C) 98.5 F (36.9 C)  TempSrc:  Oral Oral   SpO2: 96% 99% 95% 98%  Weight:   105.9 kg   Height:        General: Pt is alert, awake, not in  acute distress, lying in bed Cardiovascular: RRR, nl S1-S2, no murmurs appreciated.   No LE edema.   Respiratory: Normal respiratory rate and rhythm.  CTAB without rales or wheezes. Abdominal: Abdomen soft and non-tender.  No distension or HSM.   Neuro/Psych: Strength symmetric in upper and lower extremities and seems fairly good.  Aphasia is dense. Judgment and insight appear impaired.   The results of significant diagnostics from this hospitalization (including imaging, microbiology, ancillary and laboratory) are listed below for reference.     Microbiology: Recent Results (from the past 240 hour(s))  Resp Panel by RT-PCR (Flu A&B, Covid) Nasopharyngeal Swab     Status: None   Collection Time: 04/22/21 10:45 PM   Specimen: Nasopharyngeal Swab; Nasopharyngeal(NP) swabs in vial transport medium  Result Value Ref Range Status   SARS Coronavirus 2 by RT PCR NEGATIVE NEGATIVE Final    Comment: (NOTE) SARS-CoV-2 target nucleic acids are NOT DETECTED.  The SARS-CoV-2 RNA is generally detectable in upper respiratory specimens during the acute phase of infection. The lowest concentration of SARS-CoV-2 viral copies this assay can detect is 138 copies/mL. A negative result does not preclude SARS-Cov-2 infection and should not be used as the sole basis for treatment or other patient management decisions. A negative result may occur with  improper specimen collection/handling, submission of specimen other than nasopharyngeal swab, presence of viral mutation(s) within the areas targeted by this assay, and inadequate number of viral copies(<138 copies/mL). A negative result must be combined with clinical observations, patient history, and epidemiological information. The expected result is Negative.  Fact Sheet for Patients:  06/22/21  Fact Sheet for Healthcare Providers:  BloggerCourse.com  This test is no t yet approved or cleared  by the [place] FDA and  has been authorized for detection and/or diagnosis of SARS-CoV-2 by FDA under an Emergency Use Authorization (EUA). This EUA will remain  in effect (meaning this test can be used) for the duration of the COVID-19 declaration under Section 564(b)(1) of the Act, 21 U.S.C.section 360bbb-3(b)(1), unless the authorization is terminated  or revoked sooner.       Influenza A by PCR NEGATIVE NEGATIVE Final   Influenza B by PCR NEGATIVE NEGATIVE Final    Comment: (NOTE) The Xpert Xpress SARS-CoV-2/FLU/RSV plus assay is intended as an aid in the diagnosis of influenza from Nasopharyngeal swab specimens and should not be used as a sole basis for treatment. Nasal washings and aspirates are unacceptable for Xpert Xpress SARS-CoV-2/FLU/RSV testing.  Fact Sheet for Patients: [place]  Fact Sheet for Healthcare Providers: BloggerCourse.com  This test is not yet approved or cleared by the [place] FDA  and has been authorized for detection and/or diagnosis of SARS-CoV-2 by FDA under an Emergency Use Authorization (EUA). This EUA will remain in effect (meaning this test can be used) for the duration of the COVID-19 declaration under Section 564(b)(1) of the Act, 21 U.S.C. section 360bbb-3(b)(1), unless the authorization is terminated or revoked.  Performed at [facility], [address] [place], [place] [id number]      Labs: BNP (last 3 results) No results for input(s): BNP in the last 8760 hours. Basic Metabolic Panel: Recent Labs  Lab 04/22/21 2055 04/23/21 0429  NA 135 136  K 3.1* 3.2*  CL 99 102  CO2 27 29  GLUCOSE 97 108*  BUN 20 19  CREATININE 1.04 0.94  CALCIUM 9.3 8.6*  MG  --  2.0   Liver Function Tests: Recent Labs  Lab 04/22/21 2055  AST 28  ALT 39  ALKPHOS 117  BILITOT 0.9  PROT 6.6  ALBUMIN 3.8   No results for input(s): LIPASE, AMYLASE in the last 168  hours. Recent Labs  Lab 04/23/21 0429  AMMONIA 19   CBC: Recent Labs  Lab 04/22/21 2055 04/23/21 0429  WBC 8.6 8.4  HGB 15.4 13.8  HCT 43.8 38.5*  MCV 90.1 89.1  PLT 284 218   Cardiac Enzymes: No results for input(s): CKTOTAL, CKMB, CKMBINDEX, TROPONINI in the last 168 hours. BNP: Invalid input(s): POCBNP CBG: Recent Labs  Lab 04/22/21 2130  GLUCAP 134*   D-Dimer No results for input(s): DDIMER in the last 72 hours. Hgb A1c No results for input(s): HGBA1C in the last 72 hours. Lipid Profile No results for input(s): CHOL, HDL, LDLCALC, TRIG, CHOLHDL, LDLDIRECT in the last 72 hours. Thyroid function studies No results for input(s): TSH, T4TOTAL, T3FREE, THYROIDAB in the last 72 hours.  Invalid input(s): FREET3 Anemia work up Recent Labs    04/23/21 0429  VITAMINB12 873   Urinalysis    Component Value Date/Time   COLORURINE YELLOW 04/22/2021 2024   APPEARANCEUR CLEAR 04/22/2021 2024   LABSPEC 1.025 04/22/2021 2024   PHURINE 6.5 04/22/2021 2024   GLUCOSEU NEGATIVE 04/22/2021 2024   HGBUR NEGATIVE 04/22/2021 2024   BILIRUBINUR NEGATIVE 04/22/2021 2024   KETONESUR TRACE (A) 04/22/2021 2024   PROTEINUR NEGATIVE 04/22/2021 2024   NITRITE NEGATIVE 04/22/2021 2024   LEUKOCYTESUR NEGATIVE 04/22/2021 2024   Sepsis Labs Invalid input(s): PROCALCITONIN,  WBC,  LACTICIDVEN Microbiology Recent Results (from the past 240 hour(s))  Resp Panel by RT-PCR (Flu A&B, Covid) Nasopharyngeal Swab     Status: None   Collection Time: 04/22/21 10:45 PM   Specimen: Nasopharyngeal Swab; Nasopharyngeal(NP) swabs in vial transport medium  Result Value Ref Range Status   SARS Coronavirus 2 by RT PCR NEGATIVE NEGATIVE Final    Comment: (NOTE) SARS-CoV-2 target nucleic acids are NOT DETECTED.  The SARS-CoV-2 RNA is generally detectable in upper respiratory specimens during the acute phase of infection. The lowest concentration of SARS-CoV-2 viral copies this assay can detect  is 138 copies/mL. A negative result does not preclude SARS-Cov-2 infection and should not be used as the sole basis for treatment or other patient management decisions. A negative result may occur with  improper specimen collection/handling, submission of specimen other than nasopharyngeal swab, presence of viral mutation(s) within the areas targeted by this assay, and inadequate number of viral copies(<138 copies/mL). A negative result must be combined with clinical observations, patient history, and epidemiological information. The expected result is Negative.  Fact Sheet for Patients:  BloggerCourse.comhttps://www.fda.gov/media/152166/download  Fact Sheet for Healthcare Providers:  [employer]ious[employer]ker.it  This test is no t yet approved or cleared by the [place] FDA and  has been authorized for detection and/or diagnosis of SARS-CoV-2 by FDA under an Emergency Use Authorization (EUA). This EUA will remain  in effect (meaning this test can be used) for the duration of the COVID-19 declaration under Section 564(b)(1) of the Act, 21 U.S.C.section 360bbb-3(b)(1), unless the authorization is terminated  or revoked sooner.       Influenza A by PCR NEGATIVE NEGATIVE Final   Influenza B by PCR NEGATIVE NEGATIVE Final    Comment: (NOTE) The Xpert Xpress SARS-CoV-2/FLU/RSV plus assay is intended as an aid in the diagnosis of influenza from Nasopharyngeal swab specimens and should not be used as a sole basis for treatment. Nasal washings and aspirates are unacceptable for Xpert Xpress SARS-CoV-2/FLU/RSV testing.  Fact Sheet for Patients: BloggerCourse.com  Fact Sheet for Healthcare Providers: SeriousBroker.it  This test is not yet approved or cleared by the [place] FDA and has been authorized for detection and/or diagnosis of SARS-CoV-2 by FDA under an Emergency Use Authorization (EUA). This EUA will remain in effect  (meaning this test can be used) for the duration of the COVID-19 declaration under Section 564(b)(1) of the Act, 21 U.S.C. section 360bbb-3(b)(1), unless the authorization is terminated or revoked.  Performed at [facility], [address] [place], [place] [address]      Time coordinating discharge: 25 minutes The [place] controlled substances registry was reviewed for this patient         SIGNED:   Alberteen Sam, MD  [facility] 04/23/2021, 3:11 PM

## 2021-04-23 NOTE — ED Provider Notes (Signed)
1:07 AM Assumed care for off going team.   Blood pressure (!) 172/83, pulse 71, temperature 99.1 F (37.3 C), temperature source Oral, resp. rate 18, height 5\' 9"  (1.753 m), weight 104.3 kg, SpO2 99 %.  See their HPI for full report but in brief pending covid and UA  Results are reassuring but patient is still acting very differently from his baseline this is an acute change that happened today.  When I ask him questions he will start the answer questions in the ultrasound to stop answering.  He is not able to stand up with the nurses therefore will admit for altered mental status work-up per family request         , MD 04/23/21 [phone]

## 2021-04-23 NOTE — Evaluation (Signed)
Physical Therapy Evaluation Patient Details Name: Maurice Moses MRN: [id number] DOB: [birth year] Today's Date: 04/23/2021   History of Present Illness  Maurice Moses is a 75 y.o. male with medical history significant for multiple strokes, vascular dementia, patient reportedly seemed to be in his usual state of health on 04/21/2021 but then had increased weakness and confusion noted on waking the following morning.   Clinical Impression  Pt admitted with above diagnosis. Pt received with wife in room. Both agreeable to PT session. Pt has baseline cog deficits limiting ability to be reliable historian. Spouse reports pt is very limited community ambulator at baseline, walking short distances in home with RW with reliance on W/c for further distances. Spouse performs all cooking, cleaning, pt care, bathing/dressing ADL's for pt. Pt alert and oriented to person and place only. Pt displays deficits in motor planning, command following, weakness, and transfer ability. Pt relies on modA+1 to sit EOB with multimodal cuing and visual cuing to perform with extra time. I believe pt relies on external support from a cognitive stand point, not from a weakness standpoint based off of hx of CVA's and dementia. Initial sit EOB with post lean noted relying on minA+1 to maintain static sitting. Able to maintain with SBA after ~2 min of being upright with SBA. ModA+1 to stand to RW, use of +2 of spouse for pt safety and participation. Pt displays same level of difficulty with motor planning shifting weight anteriorly over LE's. Stood x2 with seated rest in between, for ~2 min at a time. PT demo of SPT to <> from bed to recliner back to bed to display needed task to safely go home due to poor motor planning and difficulty following commands. Pt able to perform with increased time from bed to recliner with minA+1 and with PT giving pt adequate space to perform. Poor use of RW and actually performed better with hand over hand assist  from recliner back to bed with recliner next to bed with minA+1. I believe not a strength issue once again, but proprioceptive and motor planning deficits from CVA's limiting pt's ability to perform as the transfer from the recliner back to the bed without th RW in front of pt was performed much quicker by pt with better motor planning performance with hand over hand cuing and MinA+1. During session noted pt's spouse gave husband a pill, ("Vicaden" per spouse. MD notified after session). Pt's spouse educated that if pt were to go home, PT only comfortable with stand pivot transfers to<> from seated surfaces into wheelchair, and that walking is not safe unless with HH PT. Pt's spouse verbalizing understanding. Pt would benefit from additional [facility] care attendants to assist pt in ADL's/IADL's as well. Pt currently with functional limitations due to the deficits listed below (see PT Problem List). Pt will benefit from skilled PT to increase their independence and safety with mobility to allow discharge to the venue listed below.     Follow Up Recommendations Home health PT;Supervision/Assistance - 24 hour;Supervision for mobility/OOB (Strictly HH PT with only SPT to <> from w/c to other surfaces in home. Would need SNF if attempting to walk in home.)    Equipment Recommendations  None recommended by PT    Recommendations for Other Services       Precautions / Restrictions Precautions Precautions: Fall Restrictions Weight Bearing Restrictions: No      Mobility  Bed Mobility Overal bed mobility: Needs Assistance Bed Mobility: Supine to Sit;Sit to Supine  Supine to sit: Min assist Sit to supine: Min assist   General bed mobility comments: MinA+1 to perform. Difficulty with motor planning despite single step commands and tactile/visual cues. Initial post lean but corrected once sititng upright ~2 min later. Patient Response: Flat affect  Transfers Overall transfer level: Needs  assistance Equipment used: Rolling walker (2 wheeled) Transfers: Stand Pivot Transfers;Sit to/from Stand Sit to Stand: Mod assist Stand pivot transfers: Mod assist       General transfer comment: ModA+1 with hand over hand cuing. Maintains same level of difficulty with motor planning. Requires Tactile/visual cuing to stand. Overall crouched.  Ambulation/Gait             General Gait Details: Deferred due to poor motor planning. Pt limited household ambulator, primary wheel chair user per documentation.  Stairs            Wheelchair Mobility    Modified Rankin (Stroke Patients Only)       Balance Overall balance assessment: Needs assistance Sitting-balance support: Bilateral upper extremity supported;Feet supported Sitting balance-Leahy Scale: Fair Sitting balance - Comments: Initially had post lean requiring minA+1 to maintain upright sitting. After ~ 2 min pt able to sit EOB with no LOB or difficulty.     Standing balance-Leahy Scale: Poor Standing balance comment: Maintains initial post lean with standing. Heavy UE support on RW for support. Difficulty placing body weight over BOS.                             Pertinent Vitals/Pain Pain Assessment: No/denies pain    Home Living Family/patient expects to be discharged to:: Private residence Living Arrangements: Spouse/significant other Available Help at Discharge: Family;Personal care attendant (wife, care attendant.) Type of Home: House Home Access: Ramped entrance     Home Layout: Two level;Able to live on main level with bedroom/bathroom Home Equipment: Wheelchair - [employer] - 2 wheels;Toilet riser (getting a lift chair) Additional Comments: WIfe is primary historian d/t baseline cog deficits    Prior Function Level of Independence: Needs assistance   Gait / Transfers Assistance Needed: WIfe reports walking short hosuehold distances to recliner in home.  ADL's / Homemaking Assistance  Needed: Wife performs pt hygiene, cooking meals, dressing, bathing.        Hand Dominance   Dominant Hand: Right    Extremity/Trunk Assessment   Upper Extremity Assessment Upper Extremity Assessment: Generalized weakness    Lower Extremity Assessment Lower Extremity Assessment: Generalized weakness    Cervical / Trunk Assessment Cervical / Trunk Assessment: Normal  Communication   Communication: Expressive difficulties  Cognition Arousal/Alertness: Awake/alert Behavior During Therapy: Agitated;Flat affect Overall Cognitive Status: History of cognitive impairments - at baseline                                 General Comments: A&O to person, place.      General Comments General comments (skin integrity, edema, etc.): Difficulty following 1 step commands. Agitated.    Exercises     Assessment/Plan    PT Assessment Patient needs continued PT services  PT Problem List Decreased strength;Decreased mobility;Decreased safety awareness;Decreased activity tolerance;Decreased balance;Decreased cognition;Decreased knowledge of use of DME       PT Treatment Interventions DME instruction;Therapeutic exercise;Gait training;Balance training;Stair training;Neuromuscular re-education;Functional mobility training;Therapeutic activities;Patient/family education    PT Goals (Current goals can be found in the Care Plan section)  Acute  Rehab PT Goals Patient Stated Goal: go home PT Goal Formulation: With patient/family Time For Goal Achievement: 05/07/21 Potential to Achieve Goals: Fair    Frequency Min 2X/week   Barriers to discharge        Co-evaluation               AM-PAC PT "6 Clicks" Mobility  Outcome Measure Help needed turning from your back to your side while in a flat bed without using bedrails?: A Little Help needed moving from lying on your back to sitting on the side of a flat bed without using bedrails?: A Little Help needed moving to and  from a bed to a chair (including a wheelchair)?: A Little Help needed standing up from a chair using your arms (e.g., wheelchair or bedside chair)?: A Little Help needed to walk in hospital room?: A Lot Help needed climbing 3-5 steps with a railing? : Total 6 Click Score: 15    End of Session Equipment Utilized During Treatment: Gait belt Activity Tolerance: Patient tolerated treatment well Patient left: in bed;with call bell/phone within reach;with bed alarm set;with family/visitor present Nurse Communication: Mobility status PT Visit Diagnosis: Unsteadiness on feet (R26.81);Other abnormalities of gait and mobility (R26.89);Muscle weakness (generalized) (M62.81)    Time: 4332-9518 PT Time Calculation (min) (ACUTE ONLY): 40 min   Charges:   PT Evaluation $PT Eval Moderate Complexity: 1 Mod PT Treatments $Therapeutic Activity: 38-52 mins       Shundra Wirsing M. Fairly IV, PT, DPT Physical Therapist- [facility]  04/23/2021, 3:08 PM

## 2021-04-23 NOTE — ED Notes (Signed)
Pt's wife gave pt one of his home vicodin for pain; Dr Antionette Char informed of situation and verbalized understanding

## 2021-04-24 LAB — ECHOCARDIOGRAM COMPLETE
AV Peak grad: 4.8 mmHg
Ao pk vel: 1.09 m/s
Area-P 1/2: 4.08 cm2
Height: 69 in
S' Lateral: 3 cm
Weight: 3735.47 oz

## 2021-05-10 ENCOUNTER — Telehealth: Payer: Self-pay | Admitting: Student

## 2021-05-10 NOTE — Telephone Encounter (Signed)
Attempted to contact patient's wife, Amy, to offer to schedule a Palliative Consult visit, no answer - left message with reason for call along with my name and contact information.

## 2021-05-22 ENCOUNTER — Telehealth: Payer: Self-pay

## 2021-05-22 NOTE — Telephone Encounter (Signed)
Spoke with patient's wife Amy and scheduled an in-person Palliative Consult for 05/30/21 @ 2PM.  COVID screening was negative. One cat in the home. Patient lives with wife.  Consent obtained; updated Outlook/Netsmart/Team List and Epic.   Family is aware they may be receiving a call from provider the day before or day of to confirm appointment.

## 2021-05-22 NOTE — Telephone Encounter (Signed)
Attempted to contact patient's wife Amy to schedule a Palliative Care consult appointment. No answer left a message to return call.

## 2021-05-30 ENCOUNTER — Other Ambulatory Visit: Payer: Self-pay

## 2021-05-30 ENCOUNTER — Other Ambulatory Visit: Payer: Medicare PPO | Admitting: Student

## 2021-05-30 DIAGNOSIS — I63532 Cerebral infarction due to unspecified occlusion or stenosis of left posterior cerebral artery: Secondary | ICD-10-CM

## 2021-05-30 DIAGNOSIS — Z515 Encounter for palliative care: Secondary | ICD-10-CM

## 2021-05-30 DIAGNOSIS — R531 Weakness: Secondary | ICD-10-CM

## 2021-05-30 DIAGNOSIS — F01511 Vascular dementia, unspecified severity, with agitation: Secondary | ICD-10-CM

## 2021-05-30 NOTE — Progress Notes (Signed)
[facility] Palliative Care Consult Note Telephone: [phone]  Fax: [phone]   Date of encounter: 05/30/21 2:00 PM PATIENT NAME: Maurice Moses [address] [place] [address]   [phone] (home)  DOB: 1946-03-11 MRN: [id number] PRIMARY CARE PROVIDER:    Dr. Hilbert Bible PROVIDER:   Vaughan Basta, MD [address] [place],  [place] [address] [phone]  RESPONSIBLE PARTY:    Contact Information     Name Relation Home Work [place] Spouse [phone]  [phone]        I met face to face with patient and family in the home. Palliative Care was asked to follow this patient by consultation request of  Vaughan Basta,* to address advance care planning and complex medical decision making. This is the initial visit.                                     ASSESSMENT AND PLAN / RECOMMENDATIONS:   Advance Care Planning/Goals of Care: Goals include to maximize quality of life and symptom management. Patient/health care surrogate gave his/her permission to discuss.Our advance care planning conversation included a discussion about:    The value and importance of advance care planning  Experiences with loved ones who have been seriously ill or have died  Exploration of personal, cultural or spiritual beliefs that might influence medical decisions  Exploration of goals of care in the event of a sudden injury or illness  Identification and preparation of a healthcare agent  Wife is primary decision holder. Wife confirms patient is a DNR; golden rod filled out.  Will review MOST form on next visit.  CODE STATUS: DNR  Education provided on role of palliative medicine. Will continue to provide supportive care, make recommendations as needed.   Symptom Management/Plan:  Vascular dementia-continue to provide supportive care, additional caregiver support in the home as needed when wife needs  to go out.  Reorient and redirect as needed.  Monitor for falls and safety.  Cerebral infarction-continue Plavix and statin as directed. Continue to manage blood pressure with Quinapril and hctz.  Generalized weakness-patient declines physical therapy recommendation.  Patient is encouraged to exercise routinely to help maintain strength. We had open discussion regarding patient not participating in exercises or therapy, as this will lead to further debility and weakness.   Follow up Palliative Care Visit: Palliative care will continue to follow for complex medical decision making, advance care planning, and clarification of goals. Return in 8 weeks or prn.  I spent 60 minutes providing this consultation. More than 50% of the time in this consultation was spent in counseling and care coordination.   PPS: 40%  HOSPICE ELIGIBILITY/DIAGNOSIS: TBD  Chief Complaint: Palliative Medicine initial visit.   HISTORY OF PRESENT ILLNESS:  Maurice Moses is a 75 y.o. year old male  with CVA, vascular dementia, hypertension, obesity, IBS. OA of multiple joints, left renal mass. 2016-cerebellar stroke, fall in 09/2015-subdural hematoma. Patient was hospitalized 9/3-04/23/21 due to acute ischemic CVA.  Patient had PT ordered upon most recent hospital discharge, but states he did not receive therapy. He states he does not like for people to tell him what to do. Wife reports patient with weakness, ambulating short distances; some days has more difficulty standing, impaired balance. No recent falls. Does have some expressive aphasia. Denies any swallowing difficulty; endorses a good appetite. Takes norco TID for  pain due to OA of multiple joints. Denies shortness of breath, no nausea, constipation. 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 Patient reports not being sociable, prefers to be left alone. No recent infections. A 10-point review of systems is negative, except for the pertinent positives and  negatives detailed in the HPI.    History obtained from review of EMR, discussion with primary team, and interview with family, facility staff/caregiver and/or Maurice Moses.  I reviewed available labs, medications, imaging, studies and related documents from the EMR.  Records reviewed and summarized above.    Physical Exam: Pulse 72, resp 16, b/p 118/80, sats 97% on room air Constitutional: NAD General: frail appearing EYES: anicteric sclera, lids intact, no discharge  ENMT: intact hearing, oral mucous membranes moist, dentition intact CV: S1S2, RRR, no LE edema Pulmonary: LCTA, no increased work of breathing, no cough, room air Abdomen: normo-active BS + 4 quadrants, soft and non tender GU: deferred MSK: moves all extremities, ambulatory with walker Skin: warm and dry, no rashes or wounds on visible skin Neuro: generalized weakness, A & O x 2, forgetful, aphasia Psych: non-anxious affect Hem/lymph/immuno: no widespread bruising CURRENT PROBLEM LIST:  Patient Active Problem List   Diagnosis Date Noted   Acute encephalopathy 04/23/2021   Vascular dementia ([place]) 01/27/2020   Sepsis ([place]) 01/24/2020   Stroke ([place]) 29/24/4628   Acute metabolic encephalopathy 63/81/7711   Nausea & vomiting 01/24/2020   GERD (gastroesophageal reflux disease)    Hypokalemia    Aspiration pneumonia (HCC)    Chronic fatigue 10/30/2019   Abnormal EKG 09/25/2019   Hyperlipidemia LDL goal <70 09/25/2019   Essential hypertension 09/25/2019   Aphasia 08/14/2019   Recurrent strokes ([place]) 11/26/2016   Cerebral infarction ([place]) 07/24/2015   TIA (transient ischemic attack) 07/23/2015   PAST MEDICAL HISTORY:  Active Ambulatory Problems    Diagnosis Date Noted   TIA (transient ischemic attack) 07/23/2015   Cerebral infarction ([place]) 07/24/2015   Recurrent strokes ([place]) 11/26/2016   Aphasia 08/14/2019   Abnormal EKG 09/25/2019   Hyperlipidemia LDL goal <70 09/25/2019   Essential hypertension 09/25/2019    Chronic fatigue 10/30/2019   Sepsis ([place]) 01/24/2020   Stroke ([place]) 01/24/2020   GERD (gastroesophageal reflux disease)    Hypokalemia    Aspiration pneumonia (HCC)    Acute metabolic encephalopathy 65/79/0383   Nausea & vomiting 01/24/2020   Vascular dementia ([place]) 01/27/2020   Acute encephalopathy 04/23/2021   Resolved Ambulatory Problems    Diagnosis Date Noted   CAP (community acquired pneumonia)    Acute respiratory failure with hypoxia ([place]) 01/24/2020   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([place])    Depression    Hx of endoscopy 09/02/2012   Hypertension    IBS (irritable bowel syndrome)    S/P colonoscopy 09/02/2012   Stroke (cerebrum) ([place])    Subdural hemorrhage (HCC)    SOCIAL HX:  Social History   Tobacco Use   Smoking status: Former    Packs/day: 1.00    Years: 30.00    Pack years: 30.00    Types: Cigarettes    Quit date: 09/22/1977    Years since quitting: 43.7   Smokeless tobacco: Never  Substance Use Topics   Alcohol use: Yes    Alcohol/week: 2.0 standard drinks    Types: 2 Glasses of wine per week   FAMILY HX:  Family History  Problem Relation Age of Onset   Hypertension Other  Parkinson's disease Mother    Stomach cancer Father    Hypertension Father    Parkinson's disease Brother       ALLERGIES:  Allergies  Allergen Reactions   Morphine And Related Other (See Comments)    Agitation - wife does not want him to have it   Doxycycline Nausea And Vomiting     PERTINENT MEDICATIONS:  Outpatient Encounter Medications as of 05/30/2021  Medication Sig   ascorbic acid (VITAMIN C) 500 MG tablet Take 500 mg by mouth daily.   aspirin EC 81 MG EC tablet Take 1 tablet (81 mg total) by mouth daily.   atorvastatin (LIPITOR) 40 MG tablet Take 1 tablet (40 mg total) by mouth daily at 6 PM.   cetirizine (ZYRTEC) 10 MG tablet Take 10 mg by mouth daily.   cholecalciferol (VITAMIN D) 1000 units tablet Take 2,000 Units by mouth  daily.   clopidogrel (PLAVIX) 75 MG tablet Take 1 tablet (75 mg total) by mouth daily.   docusate sodium (COLACE) 100 MG capsule Take 300 mg by mouth daily.    escitalopram (LEXAPRO) 20 MG tablet Take 20 mg by mouth every evening.    esomeprazole (NEXIUM) 40 MG capsule Take 40 mg by mouth every evening.    hydrochlorothiazide (MICROZIDE) 12.5 MG capsule Take 2 capsules (25 mg total) by mouth daily.   HYDROcodone-acetaminophen (NORCO/VICODIN) 5-325 MG tablet Take 1 tablet by mouth every 4 (four) hours as needed for moderate pain (Max 7 per day).   mirtazapine (REMERON SOL-TAB) 30 MG disintegrating tablet Take 30 mg by mouth daily.    Omega-3 1000 MG CAPS Take 1,000 mg by mouth daily.   quinapril (ACCUPRIL) 40 MG tablet Take 40 mg by mouth at bedtime.   No facility-administered encounter medications on file as of 05/30/2021.   Thank you for the opportunity to participate in the care of Maurice Moses.  The palliative care team will continue to follow. Please call our office at [phone] if we can be of additional assistance.   Ezekiel Slocumb, NP   COVID-19 PATIENT SCREENING TOOL Asked and negative response unless otherwise noted:  Have you had symptoms of covid, tested positive or been in contact with someone with symptoms/positive test in the past 5-10 days? No

## 2021-09-28 DIAGNOSIS — Z515 Encounter for palliative care: Secondary | ICD-10-CM

## 2021-09-28 NOTE — Progress Notes (Signed)
COMMUNITY PALLIATIVE CARE SW NOTE  PATIENT NAME: Maurice Moses DOB: Jun 04, 1946 MRN: [id number]  PRIMARY CARE PROVIDER: System, Provider Not In  RESPONSIBLE PARTY: There is no guarantor information entered for this encounter.  Palliative care SW outreached patient to complete telephonic visit.   Patient's spouse provided update on medical condition and/or changes. Spouse shares that patient has been maintaining same level of care since last in home visit, with no changes or declines. Has cataract surgery for early march. Spouse shared that she has noticed patient can styill be unsteady on feet, reports no falls. Patient has RW that he utilizes, and has WC if needed.  Spouse shares that patient's appetite remains good. No significant weight changes noted.   No medication needs or adjustments noted.   Psychosocial assessment: No financial, food insecurities or issues with transportation noted. No other psychosocial needs. Spouse share patient mood is the same, as he prefers to be alone and does not socialize much with other people, this is more of a character trait rather than a MH concern.   Palliative care will continue to monitor and assist with long term care planning as needed.   SOCIAL HX:  Social History   Tobacco Use   Smoking status: Former    Packs/day: 1.00    Years: 30.00    Pack years: 30.00    Types: Cigarettes    Quit date: 09/22/1977    Years since quitting: 44.0   Smokeless tobacco: Never  Substance Use Topics   Alcohol use: Yes    Alcohol/week: 2.0 standard drinks    Types: 2 Glasses of wine per week    CODE STATUS: FULL CODE ADVANCED DIRECTIVES: Y MOST FORM COMPLETE:  N HOSPICE EDUCATION PROVIDED: N        [place], LCSW

## 2021-12-29 ENCOUNTER — Telehealth: Payer: Self-pay

## 2021-12-29 NOTE — Telephone Encounter (Signed)
PC SW outreached patient/spouse to complete PC f/u call. ? ?Call unsuccessful. SW LVM with contact information. ?

## 2022-01-05 IMAGING — MR MR HEAD W/O CM
12 series · 47 of 48 positions shown · non-contrast
Comparison: 12/22/2019

CLINICAL DATA: Encephalopathy

EXAM:
MRI HEAD WITHOUT CONTRAST
TECHNIQUE: Multiplanar, multiecho pulse sequences of the brain and surrounding
structures were obtained without intravenous contrast.

[Series 5: ax dwi_tracew · axial · 3.0mm · 0.65mm/px · z∈[-71,+76]mm · 4 of 46 slices shown]
[im 1/46]
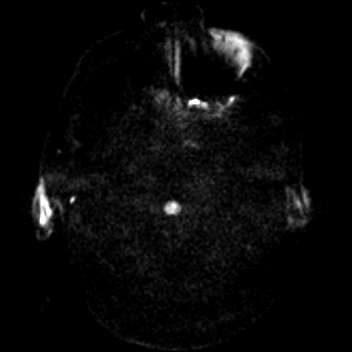
[im 16/46]
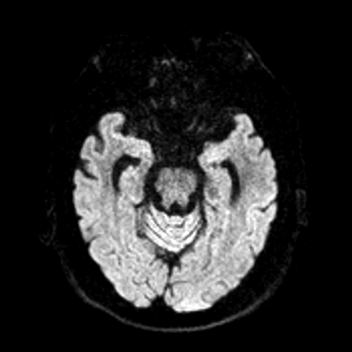
[im 31/46]
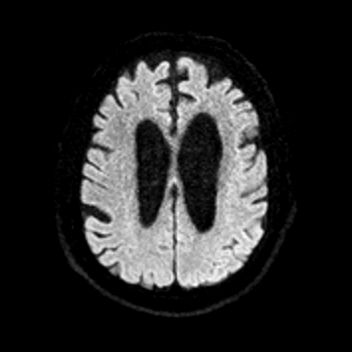
[im 46/46]
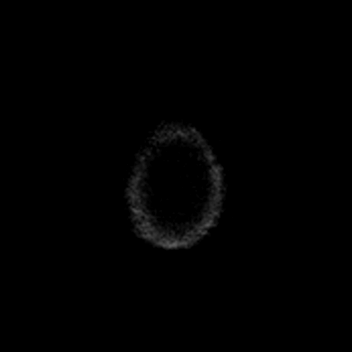

[Series 6: ax dwi_adc · axial · 3.0mm · 0.65mm/px · z∈[-71,+76]mm · 3 of 46 slices shown]
[im 1/46]
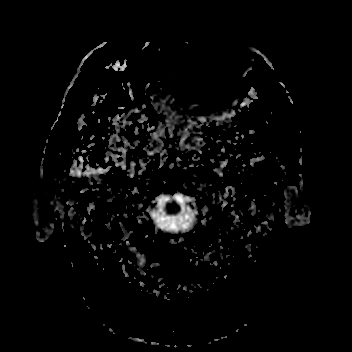
[im 23/46]
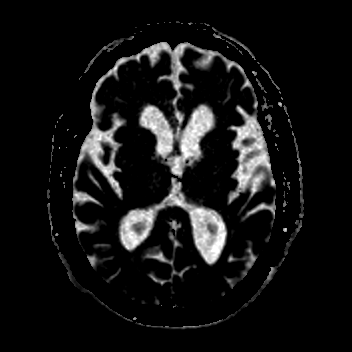
[im 46/46]
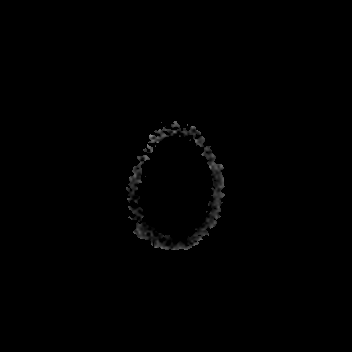

[Series 7: cor dwi_tracew · coronal · 5.0mm · 0.60mm/px · 3 of 36 slices shown]
[im 1/36]
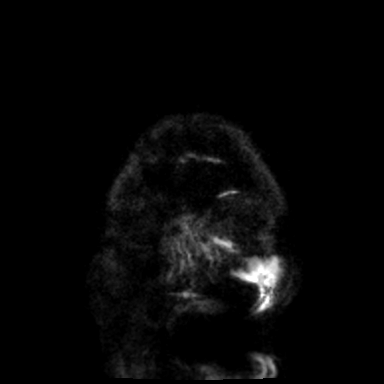
[im 18/36]
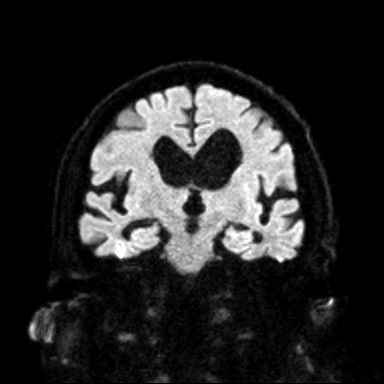
[im 36/36]
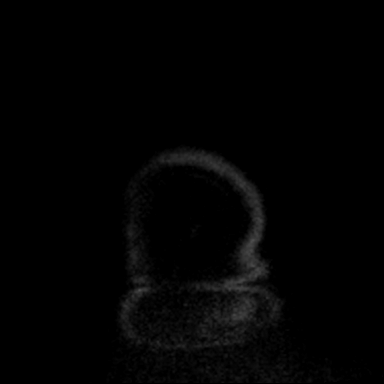

[Series 8: cor dwi_adc · coronal · 5.0mm · 0.60mm/px · 3 of 36 slices shown]
[im 1/36]
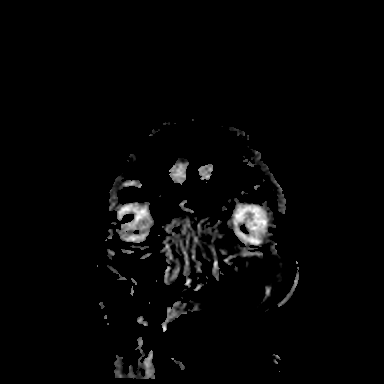
[im 18/36]
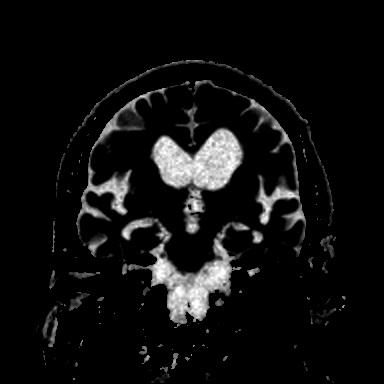
[im 36/36]
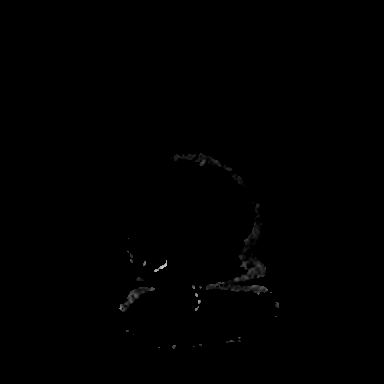

[Series 9: T1 · sagittal · 5.0mm · 0.62mm/px · 2 of 22 slices shown (1 of 2)]
[im 1/22]
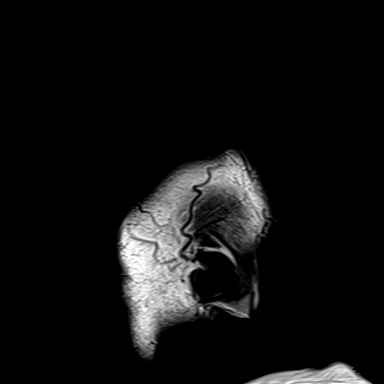
[im 22/22]
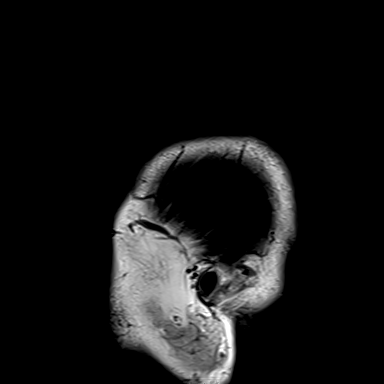

[Series 10: T2 · axial · 5.0mm · 0.53mm/px · z∈[-70,+72]mm · 2 of 25 slices shown (1 of 2)]
[im 1/25]
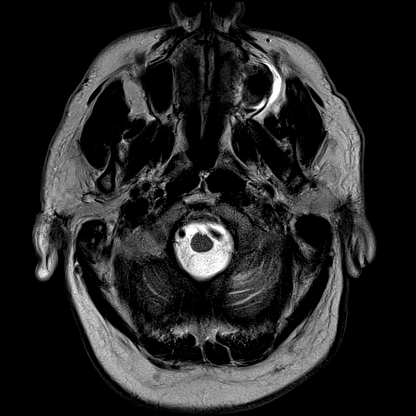
[im 25/25]
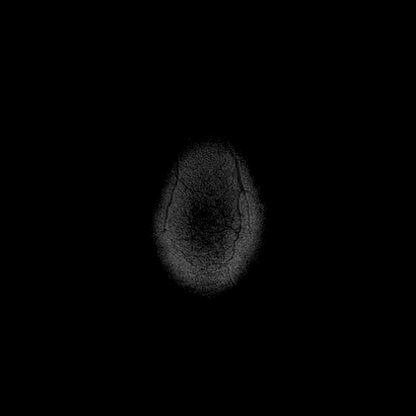

[Series 11: mag_images · axial · 3.0mm · 0.90mm/px · z∈[-86,+89]mm · 4 of 60 slices shown]
[im 1/60]
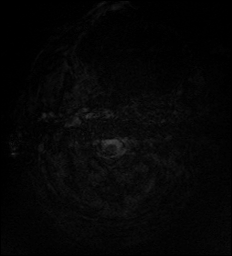
[im 20/60]
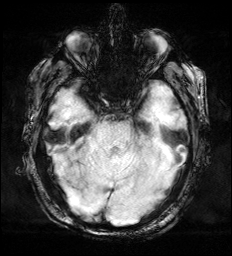
[im 40/60]
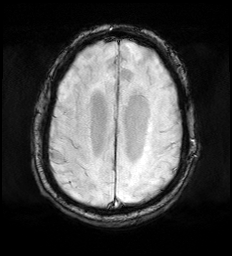
[im 60/60]
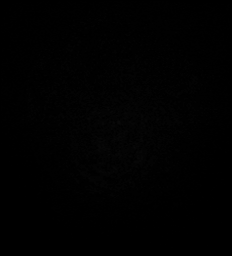

[Series 12: pha_images · axial · 3.0mm · 0.90mm/px · z∈[-86,+80]mm · 4 of 57 slices shown]
[im 1/57]
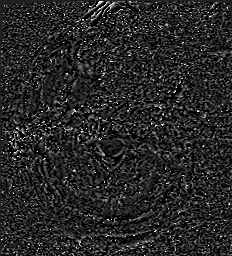
[im 19/57]
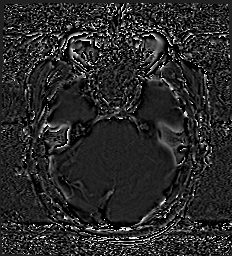
[im 38/57]
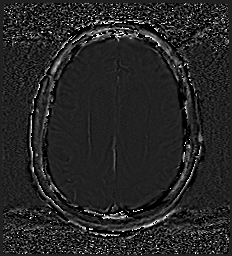
[im 57/57]
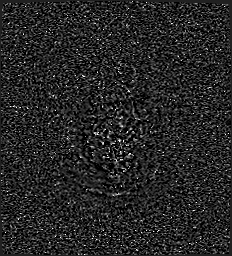

[Series 13: swi_images · axial · 3.0mm · 0.90mm/px · z∈[-86,+89]mm · 4 of 60 slices shown]
[im 1/60]
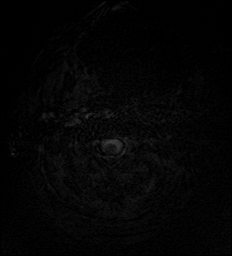
[im 20/60]
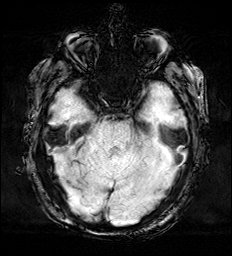
[im 40/60]
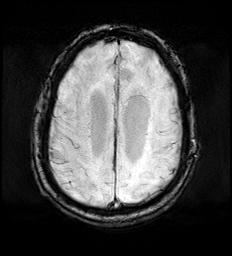
[im 60/60]
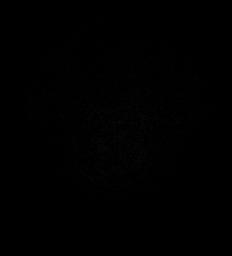

[Series 15: FLAIR · axial · 3.0mm · 0.53mm/px · z∈[-79,+81]mm · 4 of 55 slices shown]
[im 1/55]
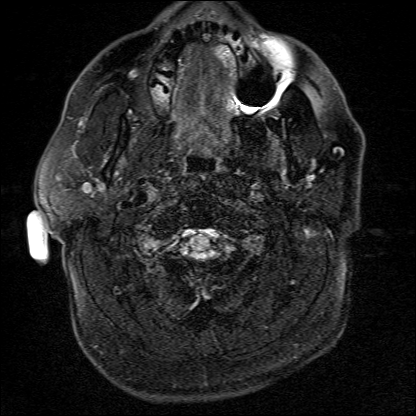
[im 19/55]
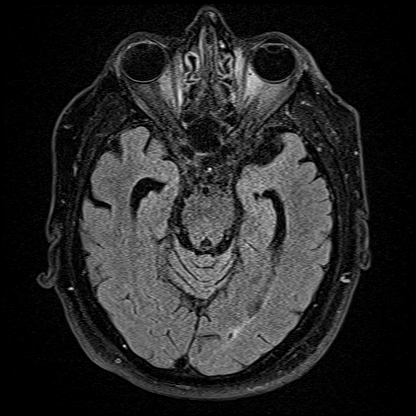
[im 37/55]
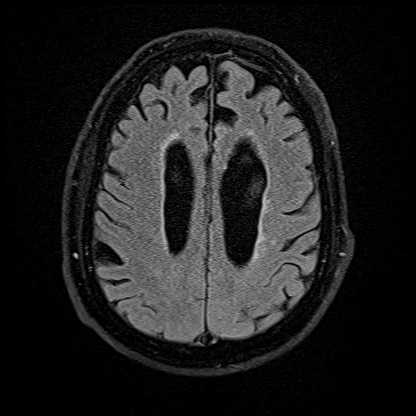
[im 55/55]
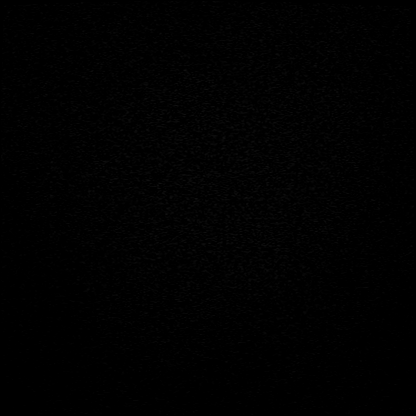

[Series 16: T1 · axial · 1.0mm · 0.98mm/px · z∈[-83,+90]mm · 12 of 176 slices shown (2 of 2)]
[im 1/176]
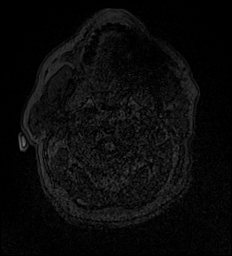
[im 15/176]
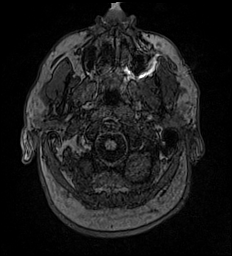
[im 30/176]
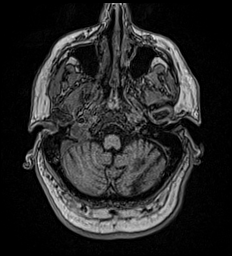
[im 44/176]
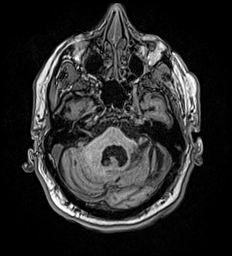
[im 59/176]
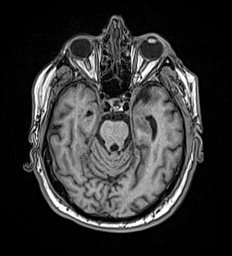
[im 73/176]
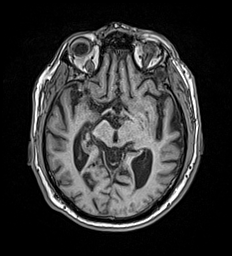
[im 88/176]
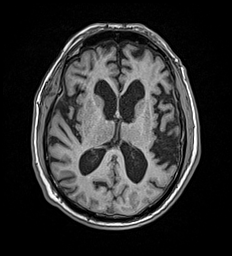
[im 103/176]
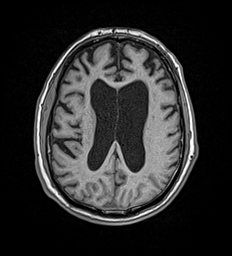
[im 117/176]
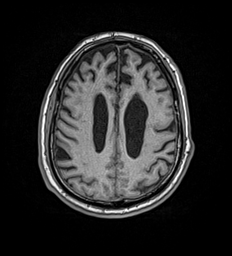
[im 132/176]
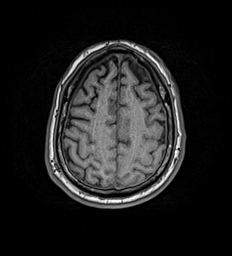
[im 146/176]
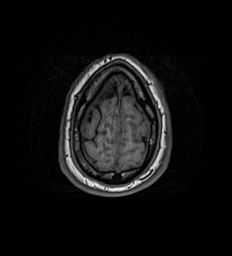
[im 176/176]
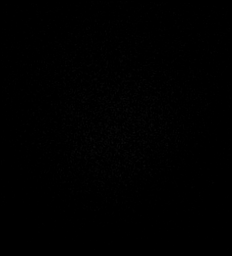

[Series 17: T2 · coronal · 5.0mm · 0.57mm/px · 2 of 29 slices shown (2 of 2)]
[im 1/29]
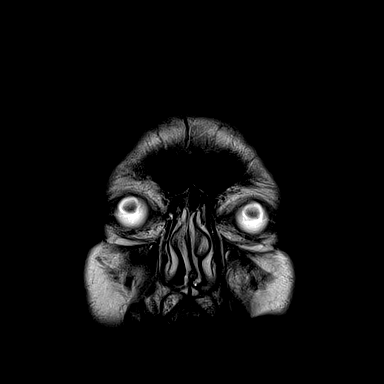
[im 29/29]
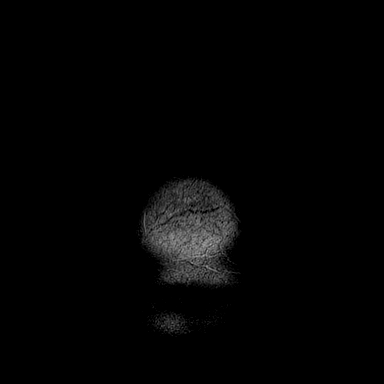

[47 of 48 positions shown; findings below may reference images not displayed]

FINDINGS: Brain: Punctate focus abnormal diffusion restriction in the
posterior right parietal lobe. No acute or chronic hemorrhage. Old
left-greater-than-right cerebellar infarcts. Generalized volume loss
is unchanged. The midline structures are normal.

Vascular: Major flow voids are preserved.

Skull and upper cervical spine: Normal calvarium and skull base.
Visualized upper cervical spine and soft tissues are normal.

Sinuses/Orbits:No paranasal sinus fluid levels or advanced mucosal
thickening. No mastoid or middle ear effusion. Normal orbits.
IMPRESSION: 1. Punctate focus of acute ischemia in the posterior right parietal
lobe. No hemorrhage or mass effect.
2. Old left-greater-than-right cerebellar infarcts.

## 2022-01-10 ENCOUNTER — Telehealth: Payer: Self-pay

## 2022-01-10 NOTE — Telephone Encounter (Signed)
PC SW returned spouse TC.  Call unsuccessful. SW LVM.

## 2022-01-11 ENCOUNTER — Telehealth: Payer: Self-pay

## 2022-01-11 NOTE — Telephone Encounter (Signed)
PC SW outreached patients spouse via telephone.  Spouse expressed concern and frustration due to patient not consuming enough fluids, especially water, daily. Spouse shares that patient may drink 2 8-10oz bottles of orange a day.   Spouse shares that patients dementia is worsening and he is becoming more suspicious of certain things and simply does not want to listen to spouse when she tris to encourage him to drink fluids.   Spouse shares that she is unsure what to do and would like guidance or recommendations.   LTC: spouse wishes to keep patient at home, as she does not feel patient will recive the care he needs at a facility. Spouse and patient currently have a caregiver in the home.   Patient is scheduled to see new PCP at [facility] 01/24/22.   SW to inform PC team NP and RN of spouse concern about dehydration for patient.

## 2022-01-12 ENCOUNTER — Telehealth: Payer: Self-pay | Admitting: Student

## 2022-01-12 NOTE — Telephone Encounter (Signed)
Discussed with wife regarding ways to encourage fluids. He is drinking better today. She is encouraged to call as needs arise. She states she would like to keep him at home if possible.

## 2022-02-28 ENCOUNTER — Other Ambulatory Visit: Payer: Medicare PPO

## 2022-02-28 DIAGNOSIS — Z515 Encounter for palliative care: Secondary | ICD-10-CM

## 2022-02-28 NOTE — Progress Notes (Signed)
Palliative care SW outreached patient to complete telephonic visit.    Palliative care SW outreached patient to complete telephonic visit. Patients spouse provided update on medical condition and/or changes.   Spouse endorses that he is doing well now. Spouse states that patient is doing better with his fluid intake and she has no concerns for patient at this time.   Hospitalizations: none recently.  PCP: Patient has new PCP in place, Dr. Nemiah Commander. Recently seen on 01/24/22. Lab work was done. Results were normal.  Appetite: patients appetite remains good. No significant weight changes noted.   No medication needs or adjustments noted. Patient is compliant with medications.    Psychosocial assessment: to completed in person at next [facility] home visit. financial, food insecurities or issues with transportation noted during call. No other psychosocial needs. No S/S of depression or anxiety noted.   Spouse declined in home CP visit at this time. PC will f/u again vie telephone in 6-8 weeks.   Palliative care will continue to monitor and assist with long term care planning as needed.

## 2022-04-05 ENCOUNTER — Emergency Department: Payer: Medicare PPO

## 2022-04-05 ENCOUNTER — Other Ambulatory Visit: Payer: Self-pay

## 2022-04-05 ENCOUNTER — Inpatient Hospital Stay
Admission: EM | Admit: 2022-04-05 | Discharge: 2022-04-07 | DRG: 871 | Disposition: A | Discharge status: Home Health | Payer: Medicare PPO | Attending: Internal Medicine | Admitting: Internal Medicine

## 2022-04-05 DIAGNOSIS — Z885 Allergy status to narcotic agent status: Secondary | ICD-10-CM

## 2022-04-05 DIAGNOSIS — J9601 Acute respiratory failure with hypoxia: Secondary | ICD-10-CM | POA: Diagnosis present

## 2022-04-05 DIAGNOSIS — J690 Pneumonitis due to inhalation of food and vomit: Secondary | ICD-10-CM | POA: Diagnosis present

## 2022-04-05 DIAGNOSIS — R935 Abnormal findings on diagnostic imaging of other abdominal regions, including retroperitoneum: Secondary | ICD-10-CM | POA: Diagnosis not present

## 2022-04-05 DIAGNOSIS — I69991 Dysphagia following unspecified cerebrovascular disease: Secondary | ICD-10-CM

## 2022-04-05 DIAGNOSIS — F05 Delirium due to known physiological condition: Secondary | ICD-10-CM | POA: Diagnosis present

## 2022-04-05 DIAGNOSIS — Z6830 Body mass index (BMI) 30.0-30.9, adult: Secondary | ICD-10-CM

## 2022-04-05 DIAGNOSIS — Z79899 Other long term (current) drug therapy: Secondary | ICD-10-CM | POA: Diagnosis not present

## 2022-04-05 DIAGNOSIS — Z20822 Contact with and (suspected) exposure to covid-19: Secondary | ICD-10-CM | POA: Diagnosis present

## 2022-04-05 DIAGNOSIS — R4182 Altered mental status, unspecified: Secondary | ICD-10-CM

## 2022-04-05 DIAGNOSIS — Z66 Do not resuscitate: Secondary | ICD-10-CM | POA: Diagnosis present

## 2022-04-05 DIAGNOSIS — N2889 Other specified disorders of kidney and ureter: Secondary | ICD-10-CM | POA: Diagnosis present

## 2022-04-05 DIAGNOSIS — R1310 Dysphagia, unspecified: Secondary | ICD-10-CM | POA: Diagnosis present

## 2022-04-05 DIAGNOSIS — E872 Acidosis, unspecified: Secondary | ICD-10-CM | POA: Diagnosis present

## 2022-04-05 DIAGNOSIS — F32A Depression, unspecified: Secondary | ICD-10-CM | POA: Diagnosis present

## 2022-04-05 DIAGNOSIS — E785 Hyperlipidemia, unspecified: Secondary | ICD-10-CM | POA: Diagnosis present

## 2022-04-05 DIAGNOSIS — A419 Sepsis, unspecified organism: Principal | ICD-10-CM | POA: Diagnosis present

## 2022-04-05 DIAGNOSIS — Z8249 Family history of ischemic heart disease and other diseases of the circulatory system: Secondary | ICD-10-CM

## 2022-04-05 DIAGNOSIS — Z881 Allergy status to other antibiotic agents status: Secondary | ICD-10-CM

## 2022-04-05 DIAGNOSIS — I10 Essential (primary) hypertension: Secondary | ICD-10-CM | POA: Diagnosis present

## 2022-04-05 DIAGNOSIS — Z87891 Personal history of nicotine dependence: Secondary | ICD-10-CM

## 2022-04-05 DIAGNOSIS — E669 Obesity, unspecified: Secondary | ICD-10-CM | POA: Diagnosis present

## 2022-04-05 DIAGNOSIS — R6521 Severe sepsis with septic shock: Secondary | ICD-10-CM | POA: Diagnosis present

## 2022-04-05 DIAGNOSIS — K529 Noninfective gastroenteritis and colitis, unspecified: Secondary | ICD-10-CM | POA: Diagnosis present

## 2022-04-05 DIAGNOSIS — Z7982 Long term (current) use of aspirin: Secondary | ICD-10-CM | POA: Diagnosis not present

## 2022-04-05 DIAGNOSIS — F0390 Unspecified dementia without behavioral disturbance: Secondary | ICD-10-CM | POA: Diagnosis present

## 2022-04-05 DIAGNOSIS — Z7902 Long term (current) use of antithrombotics/antiplatelets: Secondary | ICD-10-CM

## 2022-04-05 DIAGNOSIS — F0392 Unspecified dementia, unspecified severity, with psychotic disturbance: Secondary | ICD-10-CM | POA: Diagnosis present

## 2022-04-05 DIAGNOSIS — I639 Cerebral infarction, unspecified: Secondary | ICD-10-CM | POA: Diagnosis present

## 2022-04-05 LAB — CBC
HCT: 49.9 % (ref 39.0–52.0)
Hemoglobin: 17.1 g/dL — ABNORMAL HIGH (ref 13.0–17.0)
MCH: 30.9 pg (ref 26.0–34.0)
MCHC: 34.3 g/dL (ref 30.0–36.0)
MCV: 90.2 fL (ref 80.0–100.0)
Platelets: 336 10*3/uL (ref 150–400)
RBC: 5.53 MIL/uL (ref 4.22–5.81)
RDW: 13.1 % (ref 11.5–15.5)
WBC: 24.9 10*3/uL — ABNORMAL HIGH (ref 4.0–10.5)
nRBC: 0 % (ref 0.0–0.2)

## 2022-04-05 LAB — LACTIC ACID, PLASMA
Lactic Acid, Venous: 4.5 mmol/L (ref 0.5–1.9)
Lactic Acid, Venous: 3.4 mmol/L (ref 0.5–1.9)
Lactic Acid, Venous: 2.3 mmol/L (ref 0.5–1.9)

## 2022-04-05 LAB — DIFFERENTIAL
Abs Immature Granulocytes: 0.13 10*3/uL — ABNORMAL HIGH (ref 0.00–0.07)
Basophils Absolute: 0 10*3/uL (ref 0.0–0.1)
Basophils Relative: 0 %
Eosinophils Absolute: 0 10*3/uL (ref 0.0–0.5)
Eosinophils Relative: 0 %
Immature Granulocytes: 1 %
Lymphocytes Relative: 4 %
Lymphs Abs: 0.9 10*3/uL (ref 0.7–4.0)
Monocytes Absolute: 0.8 10*3/uL (ref 0.1–1.0)
Monocytes Relative: 3 %
Neutro Abs: 23.1 10*3/uL — ABNORMAL HIGH (ref 1.7–7.7)
Neutrophils Relative %: 92 %

## 2022-04-05 LAB — COMPREHENSIVE METABOLIC PANEL
ALT: 24 U/L (ref 0–44)
AST: 31 U/L (ref 15–41)
Albumin: 4 g/dL (ref 3.5–5.0)
Alkaline Phosphatase: 99 U/L (ref 38–126)
Anion gap: 14 (ref 5–15)
BUN: 20 mg/dL (ref 8–23)
CO2: 21 mmol/L — ABNORMAL LOW (ref 22–32)
Calcium: 9.7 mg/dL (ref 8.9–10.3)
Chloride: 100 mmol/L (ref 98–111)
Creatinine, Ser: 1.3 mg/dL — ABNORMAL HIGH (ref 0.61–1.24)
GFR, Estimated: 57 mL/min — ABNORMAL LOW (ref >60)
Glucose, Bld: 161 mg/dL — ABNORMAL HIGH (ref 70–99)
Potassium: 3.9 mmol/L (ref 3.5–5.1)
Sodium: 135 mmol/L (ref 135–145)
Total Bilirubin: 1.1 mg/dL (ref 0.3–1.2)
Total Protein: 6.8 g/dL (ref 6.5–8.1)

## 2022-04-05 LAB — PROTIME-INR
INR: 1 (ref 0.8–1.2)
Prothrombin Time: 13.4 seconds (ref 11.4–15.2)

## 2022-04-05 LAB — RESP PANEL BY RT-PCR (FLU A&B, COVID) ARPGX2
Influenza A by PCR: NEGATIVE
Influenza B by PCR: NEGATIVE
SARS Coronavirus 2 by RT PCR: NEGATIVE

## 2022-04-05 LAB — SURGICAL PCR SCREEN
MRSA, PCR: NEGATIVE
Staphylococcus aureus: NEGATIVE

## 2022-04-05 LAB — APTT: aPTT: 23 seconds — ABNORMAL LOW (ref 24–36)

## 2022-04-05 LAB — ETHANOL: Alcohol, Ethyl (B): <10 mg/dL (ref <10)

## 2022-04-05 MED ORDER — METRONIDAZOLE 500 MG/100ML IV SOLN: 500.0000 mg | Freq: Two times a day (BID) | INTRAVENOUS | Status: DC

## 2022-04-05 MED ORDER — SODIUM CHLORIDE 0.9% FLUSH
3.0000 mL | Freq: Once | INTRAVENOUS | Status: AC
  Administered 2022-04-05: 3 mL via INTRAVENOUS

## 2022-04-05 MED ORDER — ESCITALOPRAM OXALATE 10 MG PO TABS
20.0000 mg | ORAL_TABLET | Freq: Every evening | ORAL | Status: DC
  Administered 2022-04-05 – 2022-04-06 (×2): 20 mg via ORAL
  Filled 2022-04-05 (×2): qty 2

## 2022-04-05 MED ORDER — HYDROCODONE-ACETAMINOPHEN 5-325 MG PO TABS
1.0000 | ORAL_TABLET | Freq: Two times a day (BID) | ORAL | Status: DC
  Administered 2022-04-05 – 2022-04-07 (×3): 1 via ORAL
  Filled 2022-04-05 (×3): qty 1

## 2022-04-05 MED ORDER — SODIUM CHLORIDE 0.9 % IV SOLN
500.0000 mg | INTRAVENOUS | Status: DC
  Administered 2022-04-05 – 2022-04-06 (×2): 500 mg via INTRAVENOUS
  Filled 2022-04-05: qty 5
  Filled 2022-04-05: qty 500

## 2022-04-05 MED ORDER — IOHEXOL 350 MG/ML SOLN
150.0000 mL | Freq: Once | INTRAVENOUS | Status: AC | PRN
  Administered 2022-04-05: 150 mL via INTRAVENOUS

## 2022-04-05 MED ORDER — SODIUM CHLORIDE 0.9 % IV SOLN: INTRAVENOUS | Status: DC

## 2022-04-05 MED ORDER — SODIUM CHLORIDE 0.9 % IV SOLN
3.0000 g | Freq: Four times a day (QID) | INTRAVENOUS | Status: DC
  Administered 2022-04-05 – 2022-04-07 (×7): 3 g via INTRAVENOUS
  Filled 2022-04-05: qty 8
  Filled 2022-04-05: qty 3
  Filled 2022-04-05: qty 8
  Filled 2022-04-05: qty 3
  Filled 2022-04-05 (×2): qty 8
  Filled 2022-04-05: qty 3
  Filled 2022-04-05: qty 8

## 2022-04-05 MED ORDER — SODIUM CHLORIDE 0.9 % IV SOLN
2.0000 g | Freq: Once | INTRAVENOUS | Status: AC
  Administered 2022-04-05: 2 g via INTRAVENOUS
  Filled 2022-04-05: qty 12.5

## 2022-04-05 MED ORDER — ASPIRIN 81 MG PO TBEC
81.0000 mg | DELAYED_RELEASE_TABLET | Freq: Every day | ORAL | Status: DC
  Administered 2022-04-05 – 2022-04-07 (×2): 81 mg via ORAL
  Filled 2022-04-05 (×2): qty 1

## 2022-04-05 MED ORDER — PANTOPRAZOLE SODIUM 40 MG PO TBEC
40.0000 mg | DELAYED_RELEASE_TABLET | Freq: Every day | ORAL | Status: DC
  Administered 2022-04-05 – 2022-04-07 (×2): 40 mg via ORAL
  Filled 2022-04-05 (×2): qty 1

## 2022-04-05 MED ORDER — LACTATED RINGERS IV SOLN: INTRAVENOUS | Status: DC

## 2022-04-05 MED ORDER — ATORVASTATIN CALCIUM 20 MG PO TABS
40.0000 mg | ORAL_TABLET | Freq: Every day | ORAL | Status: DC
  Administered 2022-04-06: 40 mg via ORAL
  Filled 2022-04-05: qty 2

## 2022-04-05 MED ORDER — SODIUM CHLORIDE 0.9 % IV BOLUS
1000.0000 mL | Freq: Once | INTRAVENOUS | Status: AC
Start: 2022-04-05 — End: 2022-04-05
  Administered 2022-04-05: 1000 mL via INTRAVENOUS

## 2022-04-05 MED ORDER — MIRTAZAPINE 15 MG PO TBDP
30.0000 mg | ORAL_TABLET | Freq: Every day | ORAL | Status: DC
Start: 2022-04-05 — End: 2022-04-07
  Administered 2022-04-05: 30 mg via ORAL
  Filled 2022-04-05 (×2): qty 2

## 2022-04-05 MED ORDER — CLOPIDOGREL BISULFATE 75 MG PO TABS
75.0000 mg | ORAL_TABLET | Freq: Every day | ORAL | Status: DC
  Administered 2022-04-05 – 2022-04-07 (×2): 75 mg via ORAL
  Filled 2022-04-05 (×2): qty 1

## 2022-04-05 MED ORDER — METRONIDAZOLE 500 MG/100ML IV SOLN
500.0000 mg | Freq: Once | INTRAVENOUS | Status: AC
  Administered 2022-04-05: 500 mg via INTRAVENOUS

## 2022-04-05 MED ORDER — LACTATED RINGERS IV BOLUS (SEPSIS)
1000.0000 mL | Freq: Once | INTRAVENOUS | Status: AC
  Administered 2022-04-05: 1000 mL via INTRAVENOUS

## 2022-04-05 MED ORDER — SODIUM CHLORIDE 0.9 % IV SOLN
2.0000 g | Freq: Three times a day (TID) | INTRAVENOUS | Status: DC
  Administered 2022-04-05: 2 g via INTRAVENOUS
  Filled 2022-04-05: qty 12.5

## 2022-04-05 MED ORDER — VANCOMYCIN HCL IN DEXTROSE 1-5 GM/200ML-% IV SOLN
1000.0000 mg | Freq: Once | INTRAVENOUS | Status: AC
  Administered 2022-04-05: 1000 mg via INTRAVENOUS
  Filled 2022-04-05: qty 200

## 2022-04-05 NOTE — ED Notes (Signed)
2nd set of cultures obtained via butterfly at [facility].

## 2022-04-05 NOTE — Assessment & Plan Note (Addendum)
Holding antihypertensives due to septic shock.  Blood pressure is now stable and okay to resume on discharge.

## 2022-04-05 NOTE — ED Provider Notes (Addendum)
[facility] Provider Note    Event Date/Time   First MD Initiated Contact with Patient 04/05/22 1118     (approximate)   History   Altered Mental Status and Emesis   HPI  Maurice Moses is a 76 y.o. male   Past medical history of cerebellar stroke, TIA, hyperlipidemia, aspiration pneumonia, who presents today due to altered mental status, confusion, neurolysed weakness.  Last known normal was last evening, per wife.  Throughout the night and this morning patient was confused, not answering questions appropriately, complaining of generalized weakness, can usually ambulate with assist but was weak on his feet.  Denies recent illnesses, has had a subacute cough for several weeks productive of sputum but no fever.  Last night he did complain of upper abdominal pain with some nausea and vomiting.  No blood.  Bowel movements have been normal.  No dysuria.  History was obtained via limited history given by altered patient but wife is at bedside providing history.  History was also obtained by medical chart review of prior notes.      Physical Exam   Triage Vital Signs: ED Triage Vitals  Enc Vitals Group     BP 04/05/22 1124 124/84     Pulse Rate 04/05/22 1124 (!) 112     Resp 04/05/22 1124 (!) 24     Temp 04/05/22 1124 98.8 F (37.1 C)     Temp Source 04/05/22 1124 Oral     SpO2 04/05/22 1124 (!) 87 %     Weight 04/05/22 1125 227 lb 1.2 oz (103 kg)     Height 04/05/22 1125 6' (1.829 m)     Head Circumference --      Peak Flow --      Pain Score 04/05/22 1125 0     Pain Loc --      Pain Edu? --      Excl. in GC? --     Most recent vital signs: Vitals:   04/06/22 2032 04/07/22 0521  BP: (!) 148/74 137/71  Pulse: 73 65  Resp: 18 18  Temp: 98.4 F (36.9 C) 98.4 F (36.9 C)  SpO2: 94% 94%    General: Awake, no distress.  Confused, disoriented to situation and place. CV:  Warm skin, membranes slightly dry.  Tachycardic. Resp:  Normal effort.   Hypoxia to high 80s requiring 2 L nasal cannula contain saturations in the mid 90s.  No obvious Rales or focality or wheezing, though distant breath sounds bilaterally. Abd:  No distention.  He has tenderness to palpation mildly to the epigastrium.  No rigidity or guarding. Other:  Is slow to respond and occasionally responds to simple questioning when asked his name.  He does not follow complex instructions though he does track with his eyes all directions and nod his head yes appropriately at times.  He is able to move all extremities but neurological exam is complicated by his altered mental status.  No meningismus.   ED Results / Procedures / Treatments   Labs (all labs ordered are listed, but only abnormal results are displayed) Labs Reviewed  COMPREHENSIVE METABOLIC PANEL - Abnormal; Notable for the following components:      Result Value   CO2 21 (*)    Glucose, Bld 161 (*)    Creatinine, Ser 1.30 (*)    GFR, Estimated 57 (*)    All other components within normal limits  CBC - Abnormal; Notable for the following components:   WBC  24.9 (*)    Hemoglobin 17.1 (*)    All other components within normal limits  APTT - Abnormal; Notable for the following components:   aPTT 23 (*)    All other components within normal limits  DIFFERENTIAL - Abnormal; Notable for the following components:   Neutro Abs 23.1 (*)    Abs Immature Granulocytes 0.13 (*)    All other components within normal limits  LACTIC ACID, PLASMA - Abnormal; Notable for the following components:   Lactic Acid, Venous 4.5 (*)    All other components within normal limits  LACTIC ACID, PLASMA - Abnormal; Notable for the following components:   Lactic Acid, Venous 3.4 (*)    All other components within normal limits  LACTIC ACID, PLASMA - Abnormal; Notable for the following components:   Lactic Acid, Venous 2.3 (*)    All other components within normal limits  BASIC METABOLIC PANEL - Abnormal; Notable for the following  components:   Glucose, Bld 106 (*)    Calcium 8.5 (*)    All other components within normal limits  BASIC METABOLIC PANEL - Abnormal; Notable for the following components:   Calcium 8.4 (*)    Anion gap 3 (*)    All other components within normal limits  CULTURE, BLOOD (ROUTINE X 2)  CULTURE, BLOOD (ROUTINE X 2)  RESP PANEL BY RT-PCR (FLU A&B, COVID) ARPGX2  SURGICAL PCR SCREEN  PROTIME-INR  ETHANOL  LACTIC ACID, PLASMA  CBC  PROCALCITONIN  LACTIC ACID, PLASMA  CBG MONITORING, ED  I-STAT CREATININE, ED  CBG MONITORING, ED     I reviewed labs and they are notable for leukocytosis 20+ and a lactic 4+  EKG  ED ECG REPORT I, Pilar Jarvis, the attending physician, personally viewed and interpreted this ECG.   Date: 04/05/2022  EKG Time: 1123  Rate: 117  Rhythm: sinus tachycardia  Axis: Normal  Intervals:none  ST&T Change: No STEMI    RADIOLOGY I dependently reviewed and interpreted CT head and see no obvious midline shift or bleeding.   PROCEDURES:  Critical Care performed: Yes, see critical care procedure note(s)  .Critical Care  Performed by: Pilar Jarvis, MD Authorized by: Pilar Jarvis, MD   Critical care provider statement:    Critical care time (minutes):  30   Critical care time was exclusive of:  Separately billable procedures and treating other patients   Critical care was necessary to treat or prevent imminent or life-threatening deterioration of the following conditions:  Sepsis, respiratory failure and dehydration   Critical care was time spent personally by me on the following activities:  Ordering and performing treatments and interventions, ordering and review of laboratory studies, ordering and review of radiographic studies, pulse oximetry, review of old charts, re-evaluation of patient's condition, evaluation of patient's response to treatment and examination of patient    MEDICATIONS ORDERED IN ED: Medications  sodium chloride flush (NS) 0.9 %  injection 3 mL (3 mLs Intravenous Given 04/05/22 1145)  lactated ringers bolus 1,000 mL (0 mLs Intravenous Stopped 04/05/22 1533)  ceFEPIme (MAXIPIME) 2 g in sodium chloride 0.9 % 100 mL IVPB (0 g Intravenous Stopped 04/05/22 1348)  metroNIDAZOLE (FLAGYL) IVPB 500 mg (0 mg Intravenous Stopped 04/05/22 1422)  vancomycin (VANCOCIN) IVPB 1000 mg/200 mL premix (0 mg Intravenous Stopped 04/05/22 1654)  sodium chloride 0.9 % bolus 1,000 mL (0 mLs Intravenous Stopped 04/05/22 1654)  iohexol (OMNIPAQUE) 350 MG/ML injection 150 mL (150 mLs Intravenous Contrast Given 04/05/22 1425)  Consultants:  I spoke with hospitalist and neurologist regarding care plan for this patient.   IMPRESSION / MDM / ASSESSMENT AND PLAN / ED COURSE  I reviewed the triage vital signs and the nursing notes.                              Differential diagnosis includes, but is not limited to, infection including pneumonia, urinary tract infection, intra-abdominal infection, obstruction, AKI dehydration-I considered intracranial pathology like head bleed, stroke given this patient's history of CVA and prior head bleed, but less likely given systemic symptoms more likely due to infection and possible recrudescence of prior injury (discussed with on-call neurologist)  Given new o2 requirement at 2L and hypoxia I considered pulmonary edema as a contributing factor to his hypoxemia and elected to start with a 2000cc crystalloid (approx 20 cc/kg actual body weight and 30cc/kg ideal body weight) and carefully monitor respiratory status and hemodynamics.   I also suspect sepsis in this patient with tachycardia and a lactic of 4+ with leukocytosis.  Broad-spectrum antibiotics initiated.  Fluid management as above.  Hemodynamics improved with tachycardia improved after initial fluid bolus.  Given hypoxemic respiratory failure requiring new O2, will assess with CTA rule out PE, assess for pulmonary edema, check for other lung infection, CT of the  abdomen pelvis given reports of abdominal pain with nausea vomiting rule out intra-abdominal infection.  Patient require admission for sepsis and hypoxemic respiratory failure, pending imaging results.   The patient is on the cardiac monitor to evaluate for evidence of arrhythmia and/or significant heart rate changes.   Dispo: Admit  Patient's presentation is most consistent with acute presentation with potential threat to life or bodily function.       FINAL CLINICAL IMPRESSION(S) / ED DIAGNOSES   Final diagnoses:  Acute hypoxemic respiratory failure (HCC)  Sepsis, due to unspecified organism, unspecified whether acute organ dysfunction present (HCC)  Altered mental status, unspecified altered mental status type     Rx / DC Orders   ED Discharge Orders          Ordered    Increase activity slowly        04/07/22 1319    Diet - low sodium heart healthy        04/07/22 1319    amoxicillin-clavulanate (AUGMENTIN) 875-125 MG tablet  2 times daily        04/07/22 1328             Note:  This document was prepared using Dragon voice recognition software and may include unintentional dictation errors.    Pilar Jarvis, MD 04/05/22 1419    Pilar Jarvis, MD 04/22/22 1946

## 2022-04-05 NOTE — ED Triage Notes (Signed)
Pt in from home due to coughing and projectile vomiting episodes; pt currently confused; wife told EMS last time these symptoms happened pt told he had "burst cerebellum strokes"; last known well "last night" per EMS; EMS unsure of exact time as wife was unsure; vomiting episode happened once last night and then around 7:30am again per EMS; 8 of zofran given by EMS via 20g L hand IV; BG 166; 165/93 BP; 20-30s RR with EMS. Pt on plavix per EMS and denies recent falls.

## 2022-04-05 NOTE — Assessment & Plan Note (Addendum)
Secondary to aspiration pneumonia.  Supplemental oxygen.  Weaning off.

## 2022-04-05 NOTE — Sepsis Progress Note (Signed)
Notified bedside nurse of need to draw lactic acid and blood cultures.  

## 2022-04-05 NOTE — H&P (Signed)
History and Physical    Patient: Maurice Moses OHY:[id number] DOB: [birth year] DOA: 04/05/2022 DOS: the patient was seen and examined on 04/05/2022 PCP: Enid Baas, MD  Patient coming from: Home  Chief Complaint:  Chief Complaint  Patient presents with   Altered Mental Status   Emesis   HPI: Patient is a 76 year old male with past medical history of recurrent strokes with secondary dysphagia and previous episode of aspiration pneumonia who presented to the emergency room on 8/17 with increased confusion and weakness.  Patient's wife stated that he was more himself before going to bed, but then throughout the night started becoming confused.  This morning before coming in patient had few episodes of projectile vomiting.  In the emergency room, patient noted to be hypoxic requiring 2 L nasal cannula and work-up revealed septic shock with lactic acid level greater than 4 felt to be secondary to pneumonia.  Patient started on sepsis protocol with aggressive IV fluid resuscitation and broad-spectrum antibiotics.  Lactic acid level started to trend down slightly.  Patient admitted to the hospitalist service.  CT of abdomen pelvis also noted nonspecific enteritis.  Review of Systems: unable to review all systems due to the inability of the patient to answer questions. 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(HCC)    Depression    Hx of endoscopy 09/02/2012   Hypertension    IBS (irritable bowel syndrome)    S/P colonoscopy 09/02/2012   Stroke (cerebrum) (HCC)    Stroke (HCC)    Subdural hemorrhage (HCC)    Past Surgical History:  Procedure Laterality Date   LOOP RECORDER INSERTION     Social History:  reports that he quit smoking about 44 years ago. His smoking use included cigarettes. He has a 30.00 pack-year smoking history. He has never used smokeless tobacco. He reports current alcohol use of about 2.0 standard drinks of alcohol per week. He reports that he  does not use drugs.  Allergies  Allergen Reactions   Morphine And Related Other (See Comments)    Agitation - wife does not want him to have it   Doxycycline Nausea And Vomiting    Family History  Problem Relation Age of Onset   Hypertension Other    Parkinson's disease Mother    Stomach cancer Father    Hypertension Father    Parkinson's disease Brother     Prior to Admission medications   Medication Sig Start Date End Date Taking? Authorizing Provider  aspirin EC 81 MG EC tablet Take 1 tablet (81 mg total) by mouth daily. 07/26/15  Yes Auburn Bilberry, MD  atorvastatin (LIPITOR) 40 MG tablet Take 1 tablet (40 mg total) by mouth daily at 6 PM. 07/26/15  Yes Auburn Bilberry, MD  cetirizine (ZYRTEC) 10 MG tablet Take 10 mg by mouth daily.   Yes [provider]  cholecalciferol (VITAMIN D) 1000 units tablet Take 2,000 Units by mouth daily.   Yes [provider]  clopidogrel (PLAVIX) 75 MG tablet Take 1 tablet (75 mg total) by mouth daily. 07/26/15  Yes Auburn Bilberry, MD  docusate sodium (COLACE) 100 MG capsule Take 300 mg by mouth daily.    Yes [provider]  escitalopram (LEXAPRO) 20 MG tablet Take 20 mg by mouth every evening.    Yes [provider]  esomeprazole (NEXIUM) 40 MG capsule Take 40 mg by mouth every evening.    Yes [provider]  hydrochlorothiazide (HYDRODIURIL) 25 MG tablet Take 25  mg by mouth daily. 01/18/22  Yes [provider]  HYDROcodone-acetaminophen (NORCO/VICODIN) 5-325 MG tablet Take 1 tablet by mouth every 4 (four) hours as needed for moderate pain (Max 7 per day). 01/28/20  Yes Dhungel, Nishant, MD  lisinopril (ZESTRIL) 20 MG tablet Take 20 mg by mouth daily. 01/29/22  Yes [provider]  mirtazapine (REMERON SOL-TAB) 30 MG disintegrating tablet Take 30 mg by mouth daily.    Yes [provider]  triamcinolone cream (KENALOG) 0.1 % Apply topically. 02/27/22  Yes [provider]     Physical Exam: Vitals:   04/05/22 1800 04/05/22 1830 04/05/22 1859 04/05/22 1900  BP: 126/88 134/89  (!) 151/72  Pulse: 98 95  94  Resp: (!) 24 (!) 21  (!) 23  Temp:   99.7 F (37.6 C)   TempSrc:   Oral   SpO2: 95% 94%  96%  Weight:      Height:       General: Oriented x1, no acute distress HEENT: Normocephalic, atraumatic, mucous membranes are dry Neck: Supple, no JVD Cardio vascular: Regular rate and rhythm, S1-S2 Lungs: Right basilar rhonchi Abdomen: Soft, nontender, nondistended, hypoactive bowel sounds Extremities: No clubbing or cyanosis, trace pitting edema Neuro: No overt deficits although patient does seem to have some issues with word finding Psychiatry: Mild chronic underlying dementia, but no evidence of acute psychoses Skin: No skin breaks, tears or lesions   Data Reviewed:  Lab work noteworthy for improving lactic acid level markedly elevated white blood cell count on admission  Assessment and Plan: * Septic shock (HCC) secondary to aspiration pneumonia Meets criteria for septic shock given lactic acidosis of greater than 4 aspiration pneumonia source with leukocytosis, tachycardia and tachypnea.  Blood cultures pending.  Aggressively fluid resuscitation plus IV cefepime, Flagyl and vancomycin.  Placed in progressive care.  Secondary patient is a previous history of aspiration pneumonia with dysphagia secondary to previous multiple CVAs.  Acute respiratory failure with hypoxia (HCC) Secondary to aspiration pneumonia.  Supplemental oxygen.  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  Had some word finding difficulty and easily distracted.  CT scan of head unremarkable.  Treat sepsis.  Continue Lexapro and Remeron.  patient's wife states that he is on Vicodin twice a day at 8 AM and 4 PM is much more compliant when he is getting this medication.  We will make sure he gets  it.  Abnormal CT of the abdomen Noted enteritis as well as questionable renal mass.  Once sepsis stabilized, will check dedicated MRI of kidneys.  Antibiotic should cover both aspiration pneumonia as well as GI  Recurrent strokes (HCC) Continue aspirin and Plavix.  Essential hypertension Holding antihypertensives due to septic shock  Hyperlipidemia LDL goal <70 Continue statin  Obesity (BMI 30-39.9) Meets criteria for BMI greater than 30      Advance Care Planning: As confirmed by wife, patient is a DNR  Consults: None  Family Communication: Wife at bedside  Severity of Illness: The appropriate patient status for this patient is INPATIENT. Inpatient status is judged to be reasonable and necessary in order to provide the required intensity of service to ensure the patient's safety. The patient's presenting symptoms, physical exam findings, and initial radiographic and laboratory data in the context of their chronic comorbidities is felt to place them at high risk for further clinical deterioration. Furthermore, it is not anticipated that the patient will be medically stable  for discharge from the hospital within 2 midnights of admission.   * I certify that at the point of admission it is my clinical judgment that the patient will require inpatient hospital care spanning beyond 2 midnights from the point of admission due to high intensity of service, high risk for further deterioration and high frequency of surveillance required.*  Author: Hollice Espy, MD 04/05/2022 7:42 PM  For on call review [email or web address].

## 2022-04-05 NOTE — Assessment & Plan Note (Signed)
   Continue aspirin and Plavix 

## 2022-04-05 NOTE — Assessment & Plan Note (Signed)
Continue statin. 

## 2022-04-05 NOTE — Hospital Course (Addendum)
Patient is a 76 year old male with past medical history of recurrent strokes with secondary dysphagia and previous episode of aspiration pneumonia who presented to the emergency room on 8/17 with increased confusion and weakness.  Patient's wife stated that he was more himself before going to bed, but then throughout the night started becoming confused.  This morning before coming in patient had few episodes of projectile vomiting.  In the emergency room, patient noted to be hypoxic requiring 2 L nasal cannula and work-up revealed septic shock with lactic acid level greater than 4 felt to be secondary to pneumonia.  Patient started on sepsis protocol with aggressive IV fluid resuscitation and broad-spectrum antibiotics.  Lactic acid level started to trend down slightly.  Patient admitted to the hospitalist service.  CT of abdomen pelvis also noted nonspecific enteritis.

## 2022-04-05 NOTE — Assessment & Plan Note (Addendum)
Meets criteria for septic shock given lactic acidosis of greater than 4  pneumonia source with leukocytosis, tachycardia and tachypnea.  Blood cultures pending.  Aggressively fluid resuscitation plus IV cefepime, Flagyl and vancomycin.  Patient improving with decrease in procalcitonin and white blood cell count.  Seen by speech therapy and cleared from a swallowing standpoint.  Still suspect aspiration secondary to vomiting likely from enteritis.

## 2022-04-05 NOTE — Consult Note (Signed)
PHARMACY -  BRIEF ANTIBIOTIC NOTE   Pharmacy has received consult(s) for Vancomycin and Cefepime from an ED provider.  The patient's profile has been reviewed for ht/wt/allergies/indication/available labs.    One time order(s) placed for Vancomycin 1g IV and Cefepime 2g IV x 1 dose each.  Further antibiotics/pharmacy consults should be ordered by admitting physician if indicated.                       Thank you, Bettey Costa 04/05/2022  12:56 PM

## 2022-04-05 NOTE — ED Notes (Signed)
Provider Nash Dimmer. notified in person of lactic 4.5.

## 2022-04-05 NOTE — ED Notes (Signed)
EKG completed

## 2022-04-05 NOTE — ED Notes (Signed)
Pt cleaned up by this Clinical research associate and [place], NT. Pt also placed on external catheter for urine collection.

## 2022-04-05 NOTE — ED Notes (Signed)
Pt leaving for CT. Will hang vanc once pt back to room.

## 2022-04-05 NOTE — Assessment & Plan Note (Signed)
Meets criteria for BMI greater than 30 

## 2022-04-05 NOTE — ED Notes (Signed)
Attempted for 20g IV at R ac.  

## 2022-04-05 NOTE — ED Notes (Signed)
Pt 87% RA; pt placed on 3L via [place] and prompted to breath deeply through nose.

## 2022-04-05 NOTE — Assessment & Plan Note (Addendum)
Noted enteritis as well as questionable renal mass.  Antibiotic should cover both aspiration pneumonia as well as any infectious GI process.  In regards to renal mass, this was previously seen on a CT in June 2021.  Discussed with the patient's wife.  At that time, they opted not to work this up any further.  Advised the patient's wife that the lesion has grown slightly from 1 cm to about 1.3 cm.  A dedicated MRI of his kidneys could be done for further evaluation if she chooses to do so.  She asked that this information be forwarded to patient's primary care physician.

## 2022-04-05 NOTE — ED Notes (Signed)
Per pt's wife who is now at bedside LTKW 9pm yesterday; symptoms noted by wife at 02:30 this morning.

## 2022-04-05 NOTE — ED Notes (Signed)
Pt alert, confused.  Nsr on monitor  iv's in place.

## 2022-04-05 NOTE — Sepsis Progress Note (Signed)
ELink tracking the Code Sepsis. 

## 2022-04-05 NOTE — ED Notes (Signed)
Provider Nash Dimmer. at bedside currently. Pt's wife states his neuro baseline is altered but this is more so than that.

## 2022-04-05 NOTE — Consult Note (Signed)
CODE SEPSIS - PHARMACY COMMUNICATION  **Broad Spectrum Antibiotics should be administered within 1 hour of Sepsis diagnosis**  Time Code Sepsis Called/Page Received: 1302  Antibiotics Ordered: Vancomycin, Cefepime, Flagyl  Time of 1st antibiotic administration: 1318  Additional action taken by pharmacy: none  If necessary, Name of Provider/Nurse Contacted: n/a    Bettey Costa ,PharmD Clinical Pharmacist  04/05/2022  2:28 PM

## 2022-04-05 NOTE — Assessment & Plan Note (Addendum)
As per wife, patient a bit more confused than baseline.  Had some word finding difficulty and easily distracted.  CT scan of head unremarkable.  As infection treated, patient became much more alert and appropriate.  Seen by PT and OT and setting up home health PT

## 2022-04-05 NOTE — ED Notes (Signed)
IV team at bedside 

## 2022-04-06 DIAGNOSIS — A419 Sepsis, unspecified organism: Secondary | ICD-10-CM | POA: Diagnosis not present

## 2022-04-06 DIAGNOSIS — F0392 Unspecified dementia, unspecified severity, with psychotic disturbance: Secondary | ICD-10-CM | POA: Diagnosis not present

## 2022-04-06 DIAGNOSIS — J9601 Acute respiratory failure with hypoxia: Secondary | ICD-10-CM | POA: Diagnosis not present

## 2022-04-06 DIAGNOSIS — E669 Obesity, unspecified: Secondary | ICD-10-CM | POA: Diagnosis not present

## 2022-04-06 LAB — CBC
HCT: 41.4 % (ref 39.0–52.0)
Hemoglobin: 13.6 g/dL (ref 13.0–17.0)
MCH: 30.6 pg (ref 26.0–34.0)
MCHC: 32.9 g/dL (ref 30.0–36.0)
MCV: 93.2 fL (ref 80.0–100.0)
Platelets: 217 10*3/uL (ref 150–400)
RBC: 4.44 MIL/uL (ref 4.22–5.81)
RDW: 13.4 % (ref 11.5–15.5)
WBC: 10.5 10*3/uL (ref 4.0–10.5)
nRBC: 0 % (ref 0.0–0.2)

## 2022-04-06 LAB — BASIC METABOLIC PANEL
Anion gap: 7 (ref 5–15)
BUN: 18 mg/dL (ref 8–23)
CO2: 26 mmol/L (ref 22–32)
Calcium: 8.5 mg/dL — ABNORMAL LOW (ref 8.9–10.3)
Chloride: 104 mmol/L (ref 98–111)
Creatinine, Ser: 1.22 mg/dL (ref 0.61–1.24)
GFR, Estimated: >60 mL/min (ref >60)
Glucose, Bld: 106 mg/dL — ABNORMAL HIGH (ref 70–99)
Potassium: 3.7 mmol/L (ref 3.5–5.1)
Sodium: 137 mmol/L (ref 135–145)

## 2022-04-06 LAB — PROCALCITONIN: Procalcitonin: 0.32 ng/mL

## 2022-04-06 LAB — LACTIC ACID, PLASMA: Lactic Acid, Venous: 1.8 mmol/L (ref 0.5–1.9)

## 2022-04-06 MED ORDER — ACETAMINOPHEN 650 MG RE SUPP: 650.0000 mg | Freq: Four times a day (QID) | RECTAL | Status: DC | PRN

## 2022-04-06 MED ORDER — ALBUTEROL SULFATE (2.5 MG/3ML) 0.083% IN NEBU: 2.5000 mg | INHALATION_SOLUTION | RESPIRATORY_TRACT | Status: DC | PRN

## 2022-04-06 MED ORDER — ENOXAPARIN SODIUM 60 MG/0.6ML IJ SOSY
0.5000 mg/kg | PREFILLED_SYRINGE | INTRAMUSCULAR | Status: DC
  Administered 2022-04-06 – 2022-04-07 (×2): 52.5 mg via SUBCUTANEOUS
  Filled 2022-04-06 (×2): qty 0.6

## 2022-04-06 MED ORDER — ACETAMINOPHEN 325 MG PO TABS: 650.0000 mg | ORAL_TABLET | Freq: Four times a day (QID) | ORAL | Status: DC | PRN

## 2022-04-06 MED ORDER — ONDANSETRON HCL 4 MG PO TABS: 4.0000 mg | ORAL_TABLET | Freq: Four times a day (QID) | ORAL | Status: DC | PRN

## 2022-04-06 MED ORDER — ONDANSETRON HCL 4 MG/2ML IJ SOLN: 4.0000 mg | Freq: Four times a day (QID) | INTRAMUSCULAR | Status: DC | PRN

## 2022-04-06 NOTE — Evaluation (Signed)
Clinical/Bedside Swallow Evaluation Patient Details  Name: Maurice Moses MRN: [id number] Date of Birth: 08-27-1945  Today's Date: 04/06/2022 Time: SLP Start Time (ACUTE ONLY): 0920 SLP Stop Time (ACUTE ONLY): 1010 SLP Time Calculation (min) (ACUTE ONLY): 50 min  Past Medical History:  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(HCC)    Depression    Hx of endoscopy 09/02/2012   Hypertension    IBS (irritable bowel syndrome)    S/P colonoscopy 09/02/2012   Stroke (cerebrum) (HCC)    Stroke (HCC)    Subdural hemorrhage (HCC)    Past Surgical History:  Past Surgical History:  Procedure Laterality Date   LOOP RECORDER INSERTION     HPI:  Pt is a 76 year old male with past medical history of recurrent strokes with Cognitive-communication deficits baseline who presented to the emergency room on 8/17 with increased confusion and weakness.  Patient's wife stated that he was more himself before going to bed, but then throughout the night started becoming confused.  This morning before coming in patient had episodes of Projectile Vomiting.  In the emergency room, patient noted to be hypoxic requiring 2 L nasal cannula and work-up revealed septic shock with lactic acid level greater than 4 felt to be secondary to pneumonia.  Patient started on sepsis protocol with aggressive IV fluid resuscitation and broad-spectrum antibiotics.  Lactic acid level started to trend down slightly.  Patient admitted to the hospitalist service.  CT of abdomen pelvis also noted nonspecific enteritis.  CXR: negative.  CT Angio of chest: Heterogeneous airspace opacity throughout the dependent lower  lobes as well as the posterior lingula.  Wife stated concern he could have aspirated Vomit but denied any swallowing problems at home -- he eats a Regular diet and uses a Yeti cup to drink from d/t difficulty holding cups.    Assessment / Plan / Recommendation  Clinical Impression  Pt seen today for BSE. Wife  present in room. Pt awake, verbal and responded to questions, 1-step instructions. Pt eager to have something to drink.  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 He receives assistance w/ ADLs from Wife. 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 Per chart notes, pt has a baseline of Cognitive-communication deficits. Recommend f/u on a outpatient basis outside of Acute admit to address any new needs in the home.  Noted f/u by Palliative Care services in the home, per chart.  During this eval, pt appears to present w/ adequate oropharyngeal phase swallow function w/ No oropharyngeal phase dysphagia noted, No neuromuscular deficits noted. Pt consumed po trials w/ No overt, clinical s/s of aspiration during po trials. Pt appears at reduced risk for aspiration following general aspiration precautions. Pt does have a baseline of potential REFLUX (noted CT of Abd. Imaging previous admit indicating hiatal hernia and "Mild distal esophageal wall thickening". Pt is currently on a PPI.   During po trials, pt consumed all consistencies w/ no overt coughing, decline in vocal quality, or change in respiratory presentation during/post trials.  O2 sats remained in mid 90s. Oral phase appeared [facility] w/ timely bolus management, mastication, and control of bolus propulsion for A-P transfer for swallowing. Oral clearing achieved w/ all trial consistencies. OM Exam appeared [facility] w/ no unilateral weakness noted. Speech Clear. Pt fed self w/ setup support. Wife indicated she  feels he is at his baseline and was surprised he was feed himself. No c/o stomach discomfort of n/v during po intake reported.   Recommend a fairly Regular consistency diet w/ well-Cut meats, moistened foods; Thin liquids VIA CUP - pt does not use straws. Recommend general aspiration  precautions, REFLUX precautions. Tray setup and support at meals dt/ Cognition. Pills WHOLE in Puree for safer, easier swallowing d/t Cognitive decline. Education given on Pills in Puree; food consistencies and easy to eat options; general aspiration precautions. NSG to reconsult if any new needs arise. NSG agreed. MD updated.  SLP Visit Diagnosis: Dysphagia, unspecified (R13.10)    Aspiration Risk   (reduced from an oropharyngeal phase standpoint; REFLUX)    Diet Recommendation   a fairly Regular consistency diet w/ well-Cut meats, moistened foods; Thin liquids VIA CUP - pt does not use straws. Recommend general aspiration precautions, REFLUX precautions. Tray setup and support at meals.   Medication Administration: Whole meds with puree (for safer swallowing)    Other  Recommendations Recommended Consults: Consider GI evaluation;Consider esophageal assessment (as needed) Oral Care Recommendations: Oral care BID;Oral care before and after PO;Staff/trained caregiver to provide oral care (support) Other Recommendations:  (n/a)    Recommendations for follow up therapy are one component of a multi-disciplinary discharge planning process, led by the attending physician.  Recommendations may be updated based on patient status, additional functional criteria and insurance authorization.  Follow up Recommendations No SLP follow up      Assistance Recommended at Discharge Set up Supervision/Assistance (feeding support)  Functional Status Assessment Patient has not had a recent decline in their functional status (re: swallowing)  Frequency and Duration  (n/a)   (n/a)       Prognosis Prognosis for Safe Diet Advancement: Good Barriers to Reach Goals: Cognitive deficits;Time post onset;Severity of deficits Barriers/Prognosis Comment: at his baseline      Swallow Study   General Date of Onset: 04/05/22 HPI: Pt is a 76 year old male with past medical history of recurrent strokes with  Cognitive-communication deficits baseline who presented to the emergency room on 8/17 with increased confusion and weakness.  Patient's wife stated that he was more himself before going to bed, but then throughout the night started becoming confused.  This morning before coming in patient had episodes of Projectile Vomiting.  In the emergency room, patient noted to be hypoxic requiring 2 L nasal cannula and work-up revealed septic shock with lactic acid level greater than 4 felt to be secondary to pneumonia.  Patient started on sepsis protocol with aggressive IV fluid resuscitation and broad-spectrum antibiotics.  Lactic acid level started to trend down slightly.  Patient admitted to the hospitalist service.  CT of abdomen pelvis also noted nonspecific enteritis.  CXR: negative.  CT Angio of chest: Heterogeneous airspace opacity throughout the dependent lower  lobes as well as the posterior lingula.  Wife stated concern he could have aspirated Vomit but denied any swallowing problems at home -- he eats a Regular diet and uses a Yeti cup to drink from d/t difficulty holding cups. Type of Study: Bedside Swallow Evaluation Previous Swallow Assessment: 2021 - no oropharyngeal phase dysphagia w/ regular consistencies and thin liquids; poor Cognition Diet Prior to this Study: Regular;Thin liquids (per wife) Temperature Spikes Noted: No (wbc 10.5) Respiratory Status: Nasal cannula (2L) History of Recent Intubation: No Behavior/Cognition: Alert;Cooperative;Pleasant mood;Confused;Distractible;Requires cueing (baseline) Oral Cavity Assessment: Dry (min) Oral Care Completed by SLP: Yes Oral Cavity - Dentition: Adequate natural dentition Vision: Functional  for self-feeding Self-Feeding Abilities: Able to feed self;Needs assist;Needs set up Patient Positioning: Upright in bed (needed support sitting up) Baseline Vocal Quality: Normal Volitional Cough: Strong Volitional Swallow: Able to elicit     Oral/Motor/Sensory Function Overall Oral Motor/Sensory Function: Within functional limits   Ice Chips Ice chips: Within functional limits Presentation: Spoon (x3)   Thin Liquid Thin Liquid: Within functional limits Presentation: Cup;Self Fed (10+ trials)    Nectar Thick Nectar Thick Liquid: Not tested   Honey Thick Honey Thick Liquid: Not tested   Puree Puree: Within functional limits Presentation: Self Fed;Spoon (supported; 4 ozs)   Solid     Solid: Within functional limits Presentation: Self Fed (5 trials)        Jerilynn Som, MS, CCC-SLP Speech Language Pathologist Rehab Services; [facility] - [place] [phone] (ascom) Liliahna Cudd 04/06/2022,10:23 AM

## 2022-04-06 NOTE — Evaluation (Signed)
Physical Therapy Evaluation Patient Details Name: Maurice Moses MRN: [id number] DOB: September 25, 1945 Today's Date: 04/06/2022  History of Present Illness  76 year old male with past medical history of recurrent strokes with Cognitive-communication deficits baseline who presented to the emergency room on 8/17 with increased confusion and weakness.  Patient's wife stated that he was more himself before going to bed, but then throughout the night started becoming confused.  This morning before coming in patient had episodes of Projectile Vomiting.  In the emergency room, patient noted to be hypoxic requiring 2 L nasal cannula and work-up revealed septic shock with lactic acid level greater than 4 felt to be secondary to pneumonia.  Clinical Impression  Patient resting in bed upon arrival to room; alert and oriented to self, general location.  Baseline aphasia evident (optimally answers questions with yes/no format); generally follows simple commands (75% time, increased time for processing), very highly distractible by external environment.  Bilat UE/LE strength and ROM grossly symmetrical and WFL; no focal weakness appreciated.  Currently requiring mod assist for bed mobility; min/mod assist +!-2 for sit/stand from slightly elevated bed surface; min assist +1-2 for SPT from bed/chair.  Requires constant cuing for walker management, attention to task and overall task sequencing.  Generally high fall risk; recommend use of RW and physical assist at all times. Of note, sats >93% on RA at rest and with activity throughout session. Would benefit from skilled PT to address above deficits and promote optimal return to PLOF.; Recommend transition to HHPT upon discharge from acute hospitalization.  Do recommend resumption of aid services; would benefit from increase in services in possible.  If family unable to provide physical assist required, may consider transition to STR prior to return home.        Recommendations  for follow up therapy are one component of a multi-disciplinary discharge planning process, led by the attending physician.  Recommendations may be updated based on patient status, additional functional criteria and insurance authorization.  Follow Up Recommendations Home health PT      Assistance Recommended at Discharge Frequent or constant Supervision/Assistance  Patient can return home with the following  A lot of help with walking and/or transfers;A lot of help with bathing/dressing/bathroom    Equipment Recommendations    Recommendations for Other Services       Functional Status Assessment Patient has had a recent decline in their functional status and demonstrates the ability to make significant improvements in function in a reasonable and predictable amount of time.     Precautions / Restrictions Precautions Precautions: Fall Restrictions Weight Bearing Restrictions: No      Mobility  Bed Mobility Overal bed mobility: Needs Assistance Bed Mobility: Supine to Sit     Supine to sit: Max assist, Mod assist     General bed mobility comments: hand-over-hand to initiate movement of LEs towards edge of bed; extensive assist for truncal elevation    Transfers Overall transfer level: Needs assistance Equipment used: Rolling walker (2 wheels) Transfers: Sit to/from Stand, Bed to chair/wheelchair/BSC Sit to Stand: Mod assist, Min assist, +2 safety/equipment Stand pivot transfers: Min assist, +2 safety/equipment, From elevated surface         General transfer comment: prefers bilat UEs on RW (to simulate baseline technique); continuous encouragement/assist for scooting to edge of bed; assist for lift off and anteiror weight translation. Once upright, maintains static standing with min assist +1-2 for safety    Ambulation/Gait  General Gait Details: deferred this date  Stairs            Wheelchair Mobility    Modified Rankin (Stroke  Patients Only)       Balance Overall balance assessment: Needs assistance Sitting-balance support: Feet supported, No upper extremity supported Sitting balance-Leahy Scale: Fair   Postural control: Posterior lean   Standing balance-Leahy Scale: Fair Standing balance comment: +1-2 and RW for safety/stability                             Pertinent Vitals/Pain Pain Assessment Pain Assessment: Faces Faces Pain Scale: No hurt    Home Living Family/patient expects to be discharged to:: Private residence Living Arrangements: Spouse/significant other Available Help at Discharge: Family;Personal care attendant;Available 24 hours/day;Available PRN/intermittently (PCA MWF, 5 hours (1200-1700)) Type of Home: House Home Access: Ramped entrance       Home Layout: Able to live on main level with bedroom/bathroom;One level Home Equipment: Agricultural consultant (2 wheels);Wheelchair - manual Additional Comments: home/PLOF per chart/PCA, pt    Prior Function Prior Level of Function : Needs assist             Mobility Comments: PCA provides Supv-SBA during mobility with RW ADLs Comments: Pt denies need for assist with basic ADL, however, PCA reports spouse provides a lot of assist for these things in addition to all IADL     Hand Dominance        Extremity/Trunk Assessment   Upper Extremity Assessment Upper Extremity Assessment: Overall WFL for tasks assessed    Lower Extremity Assessment Lower Extremity Assessment: Generalized weakness (grossly at least 4-/5 throughout)       Communication      Cognition Arousal/Alertness: Awake/alert Behavior During Therapy: WFL for tasks assessed/performed Overall Cognitive Status: History of cognitive impairments - at baseline                                 General Comments: Baseline expressive aphasia with moderate word-finding difficulties; fair/good yes/no accuracy with simple questions.  Follows 75% simple  commands, very highly distractible by external environment        General Comments      Exercises     Assessment/Plan    PT Assessment Patient needs continued PT services  PT Problem List Decreased strength;Decreased range of motion;Decreased activity tolerance;Decreased balance;Decreased mobility;Decreased cognition;Decreased knowledge of use of DME;Decreased safety awareness;Decreased knowledge of precautions       PT Treatment Interventions Gait training;Functional mobility training;DME instruction;Therapeutic activities;Therapeutic exercise;Balance training;Patient/family education;Cognitive remediation    PT Goals (Current goals can be found in the Care Plan section)  Acute Rehab PT Goals Patient Stated Goal: difficulty communicating specific goal due to aphasia; agreeable to OOB to chair PT Goal Formulation: With patient/family Time For Goal Achievement: 04/20/22 Potential to Achieve Goals: Fair    Frequency Min 2X/week     Co-evaluation               AM-PAC PT "6 Clicks" Mobility  Outcome Measure Help needed turning from your back to your side while in a flat bed without using bedrails?: A Lot Help needed moving from lying on your back to sitting on the side of a flat bed without using bedrails?: A Lot Help needed moving to and from a bed to a chair (including a wheelchair)?: A Little Help needed standing up from a chair  using your arms (e.g., wheelchair or bedside chair)?: A Lot Help needed to walk in hospital room?: A Lot Help needed climbing 3-5 steps with a railing? : A Lot 6 Click Score: 13    End of Session Equipment Utilized During Treatment: Gait belt Activity Tolerance: Patient tolerated treatment well Patient left: in chair;with call bell/phone within reach;with family/visitor present;with chair alarm set Nurse Communication: Mobility status PT Visit Diagnosis: Difficulty in walking, not elsewhere classified (R26.2);Muscle weakness (generalized)  (M62.81)    Time: 7846-9629 PT Time Calculation (min) (ACUTE ONLY): 33 min   Charges:   PT Evaluation $PT Eval Moderate Complexity: 1 Mod         Mylissa Lambe H. Manson Passey, PT, DPT, NCS 04/06/22, 10:20 PM [phone]

## 2022-04-06 NOTE — Evaluation (Signed)
Occupational Therapy Evaluation Patient Details Name: Maurice Moses MRN: [id number] DOB: 1946-02-27 Today's Date: 04/06/2022   History of Present Illness 76 year old male with past medical history of recurrent strokes with Cognitive-communication deficits baseline who presented to the emergency room on 8/17 with increased confusion and weakness.  Patient's wife stated that he was more himself before going to bed, but then throughout the night started becoming confused.  This morning before coming in patient had episodes of Projectile Vomiting.  In the emergency room, patient noted to be hypoxic requiring 2 L nasal cannula and work-up revealed septic shock with lactic acid level greater than 4 felt to be secondary to pneumonia.   Clinical Impression   Pt was seen for OT evaluation this date. Prior to hospital admission, pt was living with his spouse and had a PCA M/W/F, 5 hrs/day for companionship. PCA present for session. PCA assists with PLOF info and discrepancies noted between pt's perception of independence and PCA. Per pt, he is generally indep with bathing, dressing, and toileting. However, PCA reports that spouse has to provide significant assist for ADL and all IADL at baseline. Pt presents to acute OT demonstrating impaired ADL performance and functional mobility 2/2 decreased strength, balance, activity tolerance, and cognition (See OT problem list). Pt able to doff R sock using figure 4 method, required MIN A for donning sock with VC for sequencing and anticipate he will require MOD-MAX A for LB ADL tasks involving ADL transfers and +2 for STS from recliner with RW. Pt instructed in anterior weight shift prior to standing but pt noted to become frustrated and declined. Active listening provided. RN notified, pt likely will require +2 assist to stand from recliner, as pt typically uses lift feature on electric recliner at home and with brief attempts with OT, was unable to clear his buttocks or  shift his weight forward without MAX VC for sequencing and MIN-MOD A to complete.  Pt would benefit from skilled OT services to address noted impairments and functional limitations (see below for any additional details) in order to maximize safety and independence while minimizing falls risk and caregiver burden. Upon hospital discharge, recommend HHOT to maximize pt safety and return to functional independence during meaningful occupations of daily life.     Recommendations for follow up therapy are one component of a multi-disciplinary discharge planning process, led by the attending physician.  Recommendations may be updated based on patient status, additional functional criteria and insurance authorization.   Follow Up Recommendations  Home health OT    Assistance Recommended at Discharge Frequent or constant Supervision/Assistance  Patient can return home with the following Two people to help with walking and/or transfers;A lot of help with bathing/dressing/bathroom;Assistance with cooking/housework;Assist for transportation;Help with stairs or ramp for entrance;Direct supervision/assist for medications management    Functional Status Assessment  Patient has had a recent decline in their functional status and demonstrates the ability to make significant improvements in function in a reasonable and predictable amount of time.  Equipment Recommendations  None recommended by OT    Recommendations for Other Services       Precautions / Restrictions Precautions Precautions: Fall Restrictions Weight Bearing Restrictions: No      Mobility Bed Mobility               General bed mobility comments: NT, up in recliner    Transfers                   General transfer comment:  attempted STS transfer from recliner with VC for sequencing, initiation and pt demonstrating difficulty with anterior weight shift/scooting towards the edge of the chair, requiring MAX sequencing cues and  MIN-MOD A for shifting each hip forward one at a time. Pt expressed frustration and did not want to attempt to stand despite encouragement and ultimately declined.      Balance Overall balance assessment: Needs assistance Sitting-balance support: Feet supported, Bilateral upper extremity supported, Single extremity supported Sitting balance-Leahy Scale: Good                                     ADL either performed or assessed with clinical judgement   ADL                                         General ADL Comments: Pt able to doff sock using figure 4 method seated in recliner, but required MOD A to don back on with VC for sequencing. Anticipate pt will require MOD-MAX A for other LB dressing tasks involving ADL transfers. Able to feed himself.     Vision         Perception     Praxis      Pertinent Vitals/Pain Pain Assessment Pain Assessment: No/denies pain     Hand Dominance     Extremity/Trunk Assessment Upper Extremity Assessment Upper Extremity Assessment: Generalized weakness   Lower Extremity Assessment Lower Extremity Assessment: Generalized weakness       Communication Communication Communication: Expressive difficulties   Cognition Arousal/Alertness: Awake/alert Behavior During Therapy: WFL for tasks assessed/performed Overall Cognitive Status: History of cognitive impairments - at baseline                                 General Comments: Pt aphasic, difficulty with expressive language, is able to express needs with increased time, became frustrated/agitated with requests to attempt STS transfers from recliner     General Comments       Exercises     Shoulder Instructions      Home Living Family/patient expects to be discharged to:: Private residence Living Arrangements: Spouse/significant other Available Help at Discharge: Family;Personal care attendant;Available 24 hours/day;Available  PRN/intermittently (PCA 5 hrs/day, M/W/F) Type of Home: House                           Additional Comments: home/PLOF per chart/PCA, pt      Prior Functioning/Environment Prior Level of Function : Needs assist       Physical Assist : Mobility (physical);ADLs (physical) Mobility (physical): Gait ADLs (physical): Bathing;Dressing;Toileting;IADLs Mobility Comments: PCA provides Supv-SBA during mobility with RW ADLs Comments: Pt denies need for assist with basic ADL, however, PCA reports spouse provides a lot of assist for these things in addition to all IADL        OT Problem List: Decreased strength;Decreased cognition;Decreased activity tolerance;Decreased safety awareness;Decreased knowledge of use of DME or AE;Impaired balance (sitting and/or standing)      OT Treatment/Interventions: Self-care/ADL training;Therapeutic exercise;Therapeutic activities;Cognitive remediation/compensation;DME and/or AE instruction;Patient/family education;Balance training    OT Goals(Current goals can be found in the care plan section) Acute Rehab OT Goals Patient Stated Goal: get better and go home OT Goal Formulation: With  patient/family Time For Goal Achievement: 04/20/22 Potential to Achieve Goals: Good ADL Goals Pt Will Perform Lower Body Dressing: with caregiver independent in assisting;with min assist;sit to/from stand Pt Will Transfer to Toilet: with min guard assist;stand pivot transfer;bedside commode (LRAD) Pt Will Perform Toileting - Clothing Manipulation and hygiene: with set-up;with min guard assist;sit to/from stand Additional ADL Goal #1: Pt will follow 2 step commands during ADL/mobility tasks to faciliate safe participation, VC PRN, 2/2 opportunities.  OT Frequency: Min 2X/week    Co-evaluation              AM-PAC OT "6 Clicks" Daily Activity     Outcome Measure Help from another person eating meals?: None Help from another person taking care of personal  grooming?: A Little Help from another person toileting, which includes using toliet, bedpan, or urinal?: A Lot Help from another person bathing (including washing, rinsing, drying)?: A Lot Help from another person to put on and taking off regular upper body clothing?: A Little Help from another person to put on and taking off regular lower body clothing?: A Lot 6 Click Score: 16   End of Session Equipment Utilized During Treatment: Gait belt Nurse Communication: Mobility status  Activity Tolerance: Treatment limited secondary to agitation Patient left: in chair;with call bell/phone within reach;with chair alarm set;Other (comment) (PCA)  OT Visit Diagnosis: Other abnormalities of gait and mobility (R26.89);Muscle weakness (generalized) (M62.81)                Time: [id number] OT Time Calculation (min): 23 min Charges:  OT General Charges $OT Visit: 1 Visit OT Evaluation $OT Eval Moderate Complexity: 1 Mod OT Treatments $Self Care/Home Management : 8-22 mins  Ardeth Perfect., MPH, MS, OTR/L ascom [phone] 04/06/22, 4:26 PM

## 2022-04-06 NOTE — Progress Notes (Signed)
[facility] Progress Note  Patient: Maurice Moses    UUV:[id number]  DOA: 04/05/2022    Date of Service: the patient was seen and examined on 04/06/2022  Brief hospital course: Patient is a 76 year old male with past medical history of recurrent strokes with secondary dysphagia and previous episode of aspiration pneumonia who presented to the emergency room on 8/17 with increased confusion and weakness.  Patient's wife stated that he was more himself before going to bed, but then throughout the night started becoming confused.  This morning before coming in patient had few episodes of projectile vomiting.  In the emergency room, patient noted to be hypoxic requiring 2 L nasal cannula and work-up revealed septic shock with lactic acid level greater than 4 felt to be secondary to pneumonia.  Patient started on sepsis protocol with aggressive IV fluid resuscitation and broad-spectrum antibiotics.  Lactic acid level started to trend down slightly.  Patient admitted to the hospitalist service.  CT of abdomen pelvis also noted nonspecific enteritis.  Assessment and Plan: Assessment and Plan: * Septic shock (HCC) secondary to aspiration pneumonia Meets criteria for septic shock given lactic acidosis of greater than 4  pneumonia source with leukocytosis, tachycardia and tachypnea.  Blood cultures pending.  Aggressively fluid resuscitation plus IV cefepime, Flagyl and vancomycin.  Patient improving with decrease in procalcitonin and white blood cell count.  Seen by speech therapy and cleared from a swallowing standpoint.  Still suspect aspiration secondary to vomiting likely from enteritis.  Acute respiratory failure with hypoxia (HCC) Secondary to aspiration pneumonia.  Supplemental oxygen.  Weaning off.  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  Had some word finding difficulty and easily distracted.  CT scan of head  unremarkable.  Appears a bit more alert with less word finding difficulty compared to previous day.  Continue Lexapro and Remeron.  patient's wife states that he is on Vicodin twice a day at 8 AM and 4 PM is much more compliant when he is getting this medication.  We will make sure he gets it.  Abnormal CT of the abdomen Noted enteritis as well as questionable renal mass.  Once sepsis stabilized, will check dedicated MRI of kidneys.  Antibiotic should cover both aspiration pneumonia as well as GI  Recurrent strokes (HCC) Continue aspirin and Plavix.  Essential hypertension Holding antihypertensives due to septic shock  Hyperlipidemia LDL goal <70 Continue statin  Obesity (BMI 30-39.9) Meets criteria for BMI greater than 30       Body mass index is 30.8 kg/m.        Consultants: None  Procedures: None  Antimicrobials: Vancomycin x1 8/17 IV Unasyn 8/17-present  Code Status: DNR   Subjective: Patient states breathing is better, some abdominal discomfort  Objective: Vital signs were reviewed and unremarkable. Vitals:   04/06/22 1234 04/06/22 1540  BP: 133/65 (!) 153/74  Pulse: 73 78  Resp: 18 18  Temp: 98.7 F (37.1 C) 99.5 F (37.5 C)  SpO2: 99% 98%    Intake/Output Summary (Last 24 hours) at 04/06/2022 1623 Last data filed at 04/06/2022 1413 Gross per 24 hour  Intake 627.06 ml  Output --  Net 627.06 ml   Filed Weights   04/05/22 1125  Weight: 103 kg   Body mass index is 30.8 kg/m.  Exam:  General: Oriented x2, no acute distress HEENT: Normocephalic, atraumatic, mucous membranes slightly dry Cardiovascular: Regular rate and rhythm, S1-S2 Respiratory: Few coarse basilar  breath sounds Abdomen: Soft, mildly distended, generalized nonspecific tenderness, hypoactive bowel sounds Musculoskeletal: No clubbing or cyanosis or edema Skin: No skin breaks, tears or lesions Psychiatry: Chronic underlying dementia, but appropriate, no evidence of acute  psychoses Neurology: No focal deficits  Data Reviewed: Improving renal function.  Lactic acid level down to 1.8.  Procalcitonin 0.32.  White count normalized.  Disposition:  Status is: Inpatient Remains inpatient appropriate because: Evaluation by PT, MRI of kidneys    Anticipated discharge date: 8/19 or 8/20  Family Communication: Left message for patient's wife DVT Prophylaxis:   Lovenox    Author: Hollice Espy ,MD 04/06/2022 4:23 PM  To reach On-call, see care teams to locate the attending and reach out via [email or web address]. Between 7PM-7AM, please contact night-coverage If you still have difficulty reaching the attending provider, please page the [facility] (Director on Call) for [facility] on amion for assistance.

## 2022-04-06 NOTE — ED Notes (Signed)
Informed RN bed assigned 

## 2022-04-06 NOTE — Progress Notes (Signed)
Anticoagulation monitoring(Lovenox):  76 yo male ordered Lovenox 40 mg Q24h    Filed Weights   04/05/22 1125  Weight: 103 kg (227 lb 1.2 oz)   BMI 30.8    Lab Results  Component Value Date   CREATININE 1.30 (H) 04/05/2022   CREATININE 0.94 04/23/2021   CREATININE 1.04 04/22/2021   Estimated Creatinine Clearance: 59.1 mL/min (A) (by C-G formula based on SCr of 1.3 mg/dL (H)). Hemoglobin & Hematocrit     Component Value Date/Time   HGB 17.1 (H) 04/05/2022 1141   HCT 49.9 04/05/2022 1141     Per Protocol for Patient with estCrcl > 30 ml/min and BMI > 30, will transition to Lovenox 52.5 mg Q24h.

## 2022-04-07 DIAGNOSIS — E669 Obesity, unspecified: Secondary | ICD-10-CM | POA: Diagnosis not present

## 2022-04-07 DIAGNOSIS — R935 Abnormal findings on diagnostic imaging of other abdominal regions, including retroperitoneum: Secondary | ICD-10-CM | POA: Diagnosis not present

## 2022-04-07 DIAGNOSIS — A419 Sepsis, unspecified organism: Secondary | ICD-10-CM | POA: Diagnosis not present

## 2022-04-07 DIAGNOSIS — J9601 Acute respiratory failure with hypoxia: Secondary | ICD-10-CM | POA: Diagnosis not present

## 2022-04-07 LAB — BASIC METABOLIC PANEL
Anion gap: 3 — ABNORMAL LOW (ref 5–15)
BUN: 18 mg/dL (ref 8–23)
CO2: 27 mmol/L (ref 22–32)
Calcium: 8.4 mg/dL — ABNORMAL LOW (ref 8.9–10.3)
Chloride: 107 mmol/L (ref 98–111)
Creatinine, Ser: 1.1 mg/dL (ref 0.61–1.24)
GFR, Estimated: >60 mL/min (ref >60)
Glucose, Bld: 96 mg/dL (ref 70–99)
Potassium: 3.6 mmol/L (ref 3.5–5.1)
Sodium: 137 mmol/L (ref 135–145)

## 2022-04-07 LAB — LACTIC ACID, PLASMA: Lactic Acid, Venous: 1 mmol/L (ref 0.5–1.9)

## 2022-04-07 MED ORDER — AMOXICILLIN-POT CLAVULANATE 875-125 MG PO TABS: 1.0000 | ORAL_TABLET | Freq: Two times a day (BID) | ORAL | 0 refills | Status: DC

## 2022-04-07 MED ORDER — MIRTAZAPINE 15 MG PO TBDP
30.0000 mg | ORAL_TABLET | Freq: Every day | ORAL | Status: DC
  Administered 2022-04-07: 30 mg via ORAL
  Filled 2022-04-07: qty 2

## 2022-04-07 MED ORDER — AZITHROMYCIN 250 MG PO TABS: 500.0000 mg | ORAL_TABLET | Freq: Every day | ORAL | Status: DC

## 2022-04-07 NOTE — Discharge Summary (Signed)
Physician Discharge Summary   Patient: Maurice Moses MRN: [id number] DOB: 05/15/1946  Admit date:     04/05/2022  Discharge date: 04/07/22  Discharge Physician: Hollice Espy   PCP: Enid Baas, MD   Recommendations at discharge:   New medication: Augmentin 875 p.o. twice daily x9 doses Patient being discharged with home health PT Patient will follow-up with his PCP in discussion about dedicated MRI of kidneys.  See below.  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  Patient's wife stated that he was more himself before going to bed, but then throughout the night started becoming confused.  This morning before coming in patient had few episodes of projectile vomiting.  In the emergency room, patient noted to be hypoxic requiring 2 L nasal cannula and work-up revealed septic shock with lactic acid level greater than 4 felt to be secondary to pneumonia.  Patient started on sepsis protocol with aggressive IV fluid resuscitation and broad-spectrum antibiotics.  Lactic acid level started to trend down slightly.  Patient admitted to the hospitalist service.  CT of abdomen pelvis also noted nonspecific enteritis.  Assessment and Plan: * Septic shock (HCC) secondary to aspiration pneumonia-resolved as of 04/07/2022 Meets criteria for septic shock given lactic acidosis of greater than 4   pneumonia source with leukocytosis, tachycardia and tachypnea.  Blood cultures pending.  Aggressively fluid resuscitation plus IV cefepime, Flagyl and vancomycin, then narrowed to Unasyn.  Patient improved significantly over the next few days with normalization of white count and improved procalcitonin.  He was seen by speech therapy and cleared from a swallowing standpoint.  Still suspect aspiration secondary to vomiting likely from enteritis.  Patient discharged on 5 days of Augmentin to complete a full 7 days of therapy.  Acute respiratory failure with hypoxia (HCC)-resolved as of 04/07/2022 Secondary to aspiration pneumonia.  Initially requiring 2 L nasal cannula, able to be weaned off.  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  Had some word finding difficulty and easily distracted.  CT scan of head unremarkable.  As infection treated, patient became much more alert and appropriate.  Seen by PT and OT and setting up home health PT  Abnormal CT of the abdomen Noted enteritis as well as questionable renal mass.  Antibiotic should cover both aspiration pneumonia as well as any infectious GI process.  In regards to renal mass, this was previously seen on a CT in June 2021.  Discussed with the patient's wife.  At that time, they opted not to work this up any further.  Advised the patient's wife that the lesion has grown slightly from 1 cm to about 1.3 cm.  A dedicated MRI of his kidneys could be done for further evaluation if she chooses to do so.  She asked that this information be forwarded to patient's primary care physician.  Recurrent strokes (HCC) Continue aspirin and Plavix.  Essential hypertension  Holding antihypertensives due to septic shock.  Blood pressure is now stable and okay to resume on discharge.  Hyperlipidemia LDL goal <70 Continue statin  Obesity (BMI 30-39.9) Meets criteria for BMI greater  than 30        Pain control - [place] Controlled Substance Reporting System database was reviewed. and patient was instructed, not to drive, operate heavy machinery, perform activities at heights, swimming or participation in water activities or provide baby-sitting services while on Pain, Sleep and Anxiety Medications; until their outpatient Physician has advised to do so again. Also recommended to not to take more than prescribed Pain, Sleep and Anxiety Medications.  Consultants: None Procedures performed: None Disposition: Home health Diet recommendation:  Discharge Diet Orders (From admission, onward)     Start     Ordered   04/07/22 0000  Diet - low sodium heart healthy        04/07/22 1319           Cardiac diet DISCHARGE MEDICATION: Allergies as of 04/07/2022       Reactions   Morphine And Related Other (See Comments)   Agitation - wife does not want him to have it   Doxycycline Nausea And Vomiting        Medication List     TAKE these medications    amoxicillin-clavulanate 875-125 MG tablet Commonly known as: AUGMENTIN Take 1 tablet by mouth 2 (two) times daily.   aspirin EC 81 MG tablet Take 1 tablet (81 mg total) by mouth daily.   atorvastatin 40 MG tablet Commonly known as: LIPITOR Take 1 tablet (40 mg total) by mouth daily at 6 PM.   cetirizine 10 MG tablet Commonly known as: ZYRTEC Take 10 mg by mouth daily.   cholecalciferol 1000 units tablet Commonly known as: VITAMIN D Take 2,000 Units by mouth daily.   clopidogrel 75 MG tablet Commonly known as: PLAVIX Take 1 tablet (75 mg total) by mouth daily.   docusate sodium 100 MG capsule Commonly known as: COLACE Take 300 mg by mouth daily.   escitalopram 20 MG tablet Commonly known as: LEXAPRO Take 20 mg by mouth every evening.   esomeprazole 40 MG capsule Commonly known as: NEXIUM Take 40 mg by mouth every evening.   hydrochlorothiazide 25 MG tablet Commonly known as:  HYDRODIURIL Take 25 mg by mouth daily.   HYDROcodone-acetaminophen 5-325 MG tablet Commonly known as: NORCO/VICODIN Take 1 tablet by mouth every 4 (four) hours as needed for moderate pain (Max 7 per day).   lisinopril 20 MG tablet Commonly known as: ZESTRIL Take 20 mg by mouth daily.   mirtazapine 30 MG disintegrating tablet Commonly known as: REMERON SOL-TAB Take 30 mg by mouth daily.   triamcinolone cream 0.1 % Commonly known as: KENALOG Apply topically.        Discharge Exam: Filed Weights   04/05/22 1125  Weight: 103 kg   General: Alert and oriented x2, no acute distress Cardiovascular: Regular rate and rhythm, S1-S2 Clear to auscultation bilaterally  Condition at discharge: good  The results of significant diagnostics from this hospitalization (including imaging, microbiology, ancillary and laboratory) are listed below for reference.   Imaging Studies: CT ANGIO HEAD NECK W WO CM  Result Date: 04/05/2022 CLINICAL DATA:  Dizziness, non-specific EXAM: CT ANGIOGRAPHY HEAD AND NECK TECHNIQUE: Multidetector CT imaging of the head and neck was performed using the standard protocol during bolus administration of intravenous contrast. Multiplanar CT image reconstructions and MIPs were obtained to evaluate the  vascular anatomy. Carotid stenosis measurements (when applicable) are obtained utilizing NASCET criteria, using the distal internal carotid diameter as the denominator. RADIATION DOSE REDUCTION: This exam was performed according to the departmental dose-optimization program which includes automated exposure control, adjustment of the mA and/or kV according to patient size and/or use of iterative reconstruction technique. CONTRAST:  OMNIPAQUE IOHEXOL 350 MG/ML SOLN COMPARISON:  None Available. FINDINGS: CTA NECK Aortic arch: Trace calcified plaque. Great vessel origins are patent. Right carotid system: Patent. Trace calcified plaque at the ICA origin. No stenosis. Left  carotid system: Patent. Calcified plaque along the proximal internal carotid with less than 50% stenosis. Vertebral arteries: Patent and codominant.  No stenosis. Skeleton: Cervical spine degenerative changes. Other neck: Unremarkable. Upper chest: No apical lung mass. Review of the MIP images confirms the above findings CTA HEAD Anterior circulation: Intracranial internal carotid arteries are patent with calcified plaque in the paraclinoid regions causing mild stenosis. Anterior and middle cerebral arteries are patent. Posterior circulation: Intracranial vertebral arteries are patent. Basilar artery is patent. Major cerebellar artery origins are patent. Right posterior communicating artery is present. Posterior cerebral arteries are patent. Focal moderate to marked stenosis of the proximal left P2 PCA. Venous sinuses: Patent as allowed by contrast bolus timing. Review of the MIP images confirms the above findings IMPRESSION: No large vessel occlusion, hemodynamically significant stenosis, or evidence of dissection. Focal moderate to marked stenosis proximal left P2 PCA. Electronically Signed   By: Guadlupe Spanish M.D.   On: 04/05/2022 15:15   CT ABDOMEN PELVIS W CONTRAST  Result Date: 04/05/2022 CLINICAL DATA:  Nausea, vomiting, abdominal pain EXAM: CT ABDOMEN AND PELVIS WITH CONTRAST TECHNIQUE: Multidetector CT imaging of the abdomen and pelvis was performed using the standard protocol following bolus administration of intravenous contrast. RADIATION DOSE REDUCTION: This exam was performed according to the departmental dose-optimization program which includes automated exposure control, adjustment of the mA and/or kV according to patient size and/or use of iterative reconstruction technique. CONTRAST:  OMNIPAQUE IOHEXOL 350 MG/ML SOLN COMPARISON:  CT abdomen pelvis, 01/24/2020 FINDINGS: Lower chest: Please see separately reported examination of the chest. Small hiatal hernia. Hepatobiliary: No solid liver  abnormality is seen. No gallstones, gallbladder wall thickening, or biliary dilatation. Pancreas: Unremarkable. No pancreatic ductal dilatation or surrounding inflammatory changes. Spleen: Normal in size without significant abnormality. Adrenals/Urinary Tract: Adrenal glands are unremarkable. Solid-appearing exophytic lesion of the posterior inferior pole of the left kidney, measuring 1.3 x 1.3 cm, previously 1.0 by 0.9 cm (series 3, image 43). Bladder is unremarkable. Stomach/Bowel: Stomach is within normal limits. Appendix is not clearly visualized. Fluid-filled, although nondistended proximal small bowel, which tapers smoothly in caliber distally, the distal small bowel decompressed and with scattered gas and stool throughout the colon to the rectum. Sigmoid diverticula. Vascular/Lymphatic: Aortic atherosclerosis. No enlarged abdominal or pelvic lymph nodes. Reproductive: No mass or other significant abnormality. Other: Moderate fat containing right inguinal hernia.  No ascites. Musculoskeletal: No acute or significant osseous findings. IMPRESSION: 1. Fluid-filled, although nondistended proximal small bowel, which tapers smoothly in caliber distally, the distal small bowel decompressed and with scattered gas and stool throughout the colon to the rectum. Findings are suggestive of nonspecific enteritis. 2. Solid-appearing exophytic lesion of the posterior inferior pole of the left kidney, measuring 1.3 x 1.3 cm, previously 1.0 by 0.9 cm. 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 Recommend multiphasic contrast enhanced renal protocol CT or MRI to further evaluate on a nonemergent, outpatient  basis. 3. Sigmoid diverticulosis without evidence of acute diverticulitis. 4. Moderate fat containing right inguinal hernia. Aortic Atherosclerosis (ICD10-I70.0). Electronically Signed   By: Jearld Lesch M.D.   On: 04/05/2022 15:05   CT Angio Chest PE W and/or Wo Contrast  Result Date:  04/05/2022 CLINICAL DATA:  New hypoxia, aspiration, altered mental status EXAM: CT ANGIOGRAPHY CHEST WITH CONTRAST TECHNIQUE: Multidetector CT imaging of the chest was performed using the standard protocol during bolus administration of intravenous contrast. Multiplanar CT image reconstructions and MIPs were obtained to evaluate the vascular anatomy. RADIATION DOSE REDUCTION: This exam was performed according to the departmental dose-optimization program which includes automated exposure control, adjustment of the mA and/or kV according to patient size and/or use of iterative reconstruction technique. CONTRAST:  OMNIPAQUE IOHEXOL 350 MG/ML SOLN COMPARISON:  None Available. FINDINGS: Cardiovascular: Satisfactory opacification of the pulmonary arteries to the segmental level. No evidence of pulmonary embolism. Mild cardiomegaly. Left and right coronary artery calcifications. No pericardial effusion. Aortic atherosclerosis. Mediastinum/Nodes: No enlarged mediastinal, hilar, or axillary lymph nodes. Thyroid gland, trachea, and esophagus demonstrate no significant findings. Lungs/Pleura: Heterogeneous airspace opacity throughout the dependent lower lobes as well as the posterior lingula (series 4, image 64). No pleural effusion or pneumothorax. Upper Abdomen: Please see separately reported examination of the abdomen and pelvis. Musculoskeletal: No chest wall abnormality. No acute osseous findings. Review of the MIP images confirms the above findings. IMPRESSION: 1. Negative examination for pulmonary embolism. 2. Heterogeneous airspace opacity throughout the dependent lower lobes as well as the posterior lingula, concerning for infection or aspiration. 3. Coronary artery disease. Aortic Atherosclerosis (ICD10-I70.0). Electronically Signed   By: Jearld Lesch M.D.   On: 04/05/2022 14:58   DG Chest 2 View  Result Date: 04/05/2022 CLINICAL DATA:  Altered mental status, cough, project of on a ting, confusion,  history of sella burr stroke EXAM: CHEST - 2 VIEW COMPARISON:  01/24/2020 FINDINGS: Normal heart size, mediastinal contours, and pulmonary vascularity. Lungs clear. No pulmonary infiltrate, pleural effusion, or pneumothorax. Atherosclerotic calcification aortic arch. No acute osseous findings. IMPRESSION: No acute abnormalities. Aortic Atherosclerosis (ICD10-I70.0). Electronically Signed   By: Ulyses Southward M.D.   On: 04/05/2022 12:32   CT HEAD WO CONTRAST  Result Date: 04/05/2022 CLINICAL DATA:  Altered mental status. EXAM: CT HEAD WITHOUT CONTRAST TECHNIQUE: Contiguous axial images were obtained from the base of the skull through the vertex without intravenous contrast. RADIATION DOSE REDUCTION: This exam was performed according to the departmental dose-optimization program which includes automated exposure control, adjustment of the mA and/or kV according to patient size and/or use of iterative reconstruction technique. COMPARISON:  April 22, 2021. FINDINGS: Brain: Mild diffuse cortical atrophy is noted. Old left cerebellar infarction is noted. No mass effect or midline shift is noted. Ventricular size is within normal limits. There is no evidence of mass lesion, hemorrhage or acute infarction. Vascular: No hyperdense vessel or unexpected calcification. Skull: Normal. Negative for fracture or focal lesion. Sinuses/Orbits: No acute finding. Other: None. IMPRESSION: No acute intracranial abnormality seen. Electronically Signed   By: Lupita Raider M.D.   On: 04/05/2022 12:22    Microbiology: Results for orders placed or performed during the hospital encounter of 04/05/22  Blood culture (routine x 2)     Status: None (Preliminary result)   Collection Time: 04/05/22  1:00 PM   Specimen: BLOOD  Result Value Ref Range Status   Specimen Description BLOOD RIGHT HAND  Final   Special Requests   Final  BOTTLES DRAWN AEROBIC AND ANAEROBIC Blood Culture adequate volume   Culture   Final    NO GROWTH 2  DAYS Performed at [facility], [address] [place], [place] [address]    Report Status PENDING  Incomplete  Blood culture (routine x 2)     Status: None (Preliminary result)   Collection Time: 04/05/22  1:00 PM   Specimen: BLOOD  Result Value Ref Range Status   Specimen Description BLOOD LEFT FA  Final   Special Requests   Final    BOTTLES DRAWN AEROBIC AND ANAEROBIC Blood Culture adequate volume   Culture   Final    NO GROWTH 2 DAYS Performed at [facility], [address] [place], [place] [address]    Report Status PENDING  Incomplete  Resp Panel by RT-PCR (Flu A&B, Covid) Anterior Nasal Swab     Status: None   Collection Time: 04/05/22  4:22 PM   Specimen: Anterior Nasal Swab  Result Value Ref Range Status   SARS Coronavirus 2 by RT PCR NEGATIVE NEGATIVE Final    Comment: (NOTE) SARS-CoV-2 target nucleic acids are NOT DETECTED.  The SARS-CoV-2 RNA is generally detectable in upper respiratory specimens during the acute phase of infection. The lowest concentration of SARS-CoV-2 viral copies this assay can detect is 138 copies/mL. A negative result does not preclude SARS-Cov-2 infection and should not be used as the sole basis for treatment or other patient management decisions. A negative result may occur with  improper specimen collection/handling, submission of specimen other than nasopharyngeal swab, presence of viral mutation(s) within the areas targeted by this assay, and inadequate number of viral copies(<138 copies/mL). A negative result must be combined with clinical observations, patient history, and epidemiological information. The expected result is Negative.  Fact Sheet for Patients:  BloggerCourse.comhttps://www.fda.gov/media/152166/download  Fact Sheet for Healthcare Providers:  SeriousBroker.ithttps://www.fda.gov/media/152162/download  This test is no t yet approved or cleared by the [place] FDA and  has been authorized for detection and/or diagnosis of  SARS-CoV-2 by FDA under an Emergency Use Authorization (EUA). This EUA will remain  in effect (meaning this test can be used) for the duration of the COVID-19 declaration under Section 564(b)(1) of the Act, 21 U.S.C.section 360bbb-3(b)(1), unless the authorization is terminated  or revoked sooner.       Influenza A by PCR NEGATIVE NEGATIVE Final   Influenza B by PCR NEGATIVE NEGATIVE Final    Comment: (NOTE) The Xpert Xpress SARS-CoV-2/FLU/RSV plus assay is intended as an aid in the diagnosis of influenza from Nasopharyngeal swab specimens and should not be used as a sole basis for treatment. Nasal washings and aspirates are unacceptable for Xpert Xpress SARS-CoV-2/FLU/RSV testing.  Fact Sheet for Patients: BloggerCourse.comhttps://www.fda.gov/media/152166/download  Fact Sheet for Healthcare Providers: SeriousBroker.ithttps://www.fda.gov/media/152162/download  This test is not yet approved or cleared by the [place] FDA and has been authorized for detection and/or diagnosis of SARS-CoV-2 by FDA under an Emergency Use Authorization (EUA). This EUA will remain in effect (meaning this test can be used) for the duration of the COVID-19 declaration under Section 564(b)(1) of the Act, 21 U.S.C. section 360bbb-3(b)(1), unless the authorization is terminated or revoked.  Performed at [facility], [address] [place], [place] [address]   Surgical pcr screen     Status: None   Collection Time: 04/05/22  8:26 PM   Specimen: Nasal Mucosa; Nasal Swab  Result Value Ref Range Status   MRSA, PCR NEGATIVE NEGATIVE Final   Staphylococcus aureus NEGATIVE  NEGATIVE Final    Comment: (NOTE) The Xpert SA Assay (FDA approved for NASAL specimens in patients 20 years of age and older), is one component of a comprehensive surveillance program. It is not intended to diagnose infection nor to guide or monitor treatment. Performed at [facility], [address] [place], [place] [address]      Labs: CBC: Recent Labs  Lab 04/05/22 1141 04/06/22 0512  WBC 24.9* 10.5  NEUTROABS 23.1*  --   HGB 17.1* 13.6  HCT 49.9 41.4  MCV 90.2 93.2  PLT 336 217   Basic Metabolic Panel: Recent Labs  Lab 04/05/22 1141 04/06/22 0512 04/07/22 0512  NA 135 137 137  K 3.9 3.7 3.6  CL 100 104 107  CO2 21* 26 27  GLUCOSE 161* 106* 96  BUN 20 18 18   CREATININE 1.30* 1.22 1.10  CALCIUM 9.7 8.5* 8.4*   Liver Function Tests: Recent Labs  Lab 04/05/22 1141  AST 31  ALT 24  ALKPHOS 99  BILITOT 1.1  PROT 6.8  ALBUMIN 4.0   CBG: No results for input(s): "GLUCAP" in the last 168 hours.  Discharge time spent: less than 30 minutes.  Signed: 04/07/22, MD [facility] 04/07/2022

## 2022-04-07 NOTE — TOC Progression Note (Signed)
Transition of Care [facility]) - Progression Note    Patient Details  Name: Maurice Moses MRN: [id number] Date of Birth: [birth year]  [facility] CM/SW Contact  Susa Simmonds, [place] Phone Number: 04/07/2022, 2:15 PM  Clinical Narrative:    [facility] is able to take patient for home health PT. Wife declined OT.         Expected Discharge Plan and Services           Expected Discharge Date: 04/07/22                                     Social Determinants of Health (SDOH) Interventions    Readmission Risk Interventions     No data to display

## 2022-04-07 NOTE — Plan of Care (Signed)
IV's removed, discharge instructions review, Patient discharged to home with wife Amy

## 2022-04-11 LAB — CULTURE, BLOOD (ROUTINE X 2)
Culture: NO GROWTH
Culture: NO GROWTH
Special Requests: ADEQUATE
Special Requests: ADEQUATE

## 2022-05-01 ENCOUNTER — Telehealth: Payer: Self-pay

## 2022-05-01 NOTE — Telephone Encounter (Signed)
514 pm.  Message received from wife needing a call back regarding patient.  Return call to wife's cell phone but unable to reach her.  Phone call made to home phone and message left requesting a call back.

## 2022-05-02 ENCOUNTER — Telehealth: Payer: Self-pay | Admitting: Student

## 2022-05-02 NOTE — Telephone Encounter (Signed)
Palliative NP returned call to patient's wife. She reports patient having increased weakness, functional decline. He did not receive therapy after most recent hospitalization. Wife reports patient with behaviors, agitation, which she states happens when he has had a stroke. She does not want to take him to hospital if possible. We discussed possible causes. No fever, chills, VSS. We discussed hospice but she doesn't think he is there yet. She would like to keep him at home. Will have patient seen by palliative RN in the home and connect virtually. She is advised to have patient evaluated in ER should symptoms worsen.

## 2022-05-03 ENCOUNTER — Telehealth: Payer: Self-pay

## 2022-05-03 NOTE — Telephone Encounter (Signed)
1220 pm.  Phone call made to wife to schedule a home visit with Gwinda Maine, NP.  Visit scheduled for next Wednesday at 11 am.  Will connect NP virtually.

## 2022-05-09 ENCOUNTER — Other Ambulatory Visit: Payer: Medicare PPO

## 2022-05-09 ENCOUNTER — Other Ambulatory Visit: Payer: Medicare PPO | Admitting: Student

## 2022-05-09 VITALS — BP 142/84 | HR 56 | Temp 97.4°F

## 2022-05-09 DIAGNOSIS — Z515 Encounter for palliative care: Secondary | ICD-10-CM

## 2022-05-09 DIAGNOSIS — R531 Weakness: Secondary | ICD-10-CM

## 2022-05-09 DIAGNOSIS — R52 Pain, unspecified: Secondary | ICD-10-CM

## 2022-05-09 NOTE — Progress Notes (Signed)
Therapist, nutritional Palliative Care Consult Note Telephone: [phone]  Fax: [phone]    Date of encounter: 05/09/22 11:18 AM PATIENT NAME: Maurice Moses [address] [place] [address]   [id number] (home)  DOB: May 16, 1946 MRN: [id number] PRIMARY CARE PROVIDER:    Enid Baas, MD,  [address] [place] [address] [phone]  REFERRING PROVIDER:   Enid Baas, MD [address] [place],  [place] [address] [phone]  RESPONSIBLE PARTY:    Contact Information     Name Relation Home Work Mobile   Gammel,Amoret Spouse [phone]  [phone]         Due to the COVID-19 crisis, this visit was done via telemedicine from my office and it was initiated and consent by this patient and or family.  I connected with  Maurice Moses OR PROXY on 05/09/22 by a video enabled telemedicine application and verified that I am speaking with the correct person using two identifiers.   I discussed the limitations of evaluation and management by telemedicine. The patient expressed understanding and agreed to proceed. Joint visit with Palliative RN Willette Pa, present in the home.                                     ASSESSMENT AND PLAN / RECOMMENDATIONS:   Advance Care Planning/Goals of Care: Goals include to maximize quality of life and symptom management. Patient/health care surrogate gave his/her permission to discuss. Our advance care planning conversation included a discussion about:    The value and importance of advance care planning  Experiences with loved ones who have been seriously ill or have died  Exploration of personal, cultural or spiritual beliefs that might influence medical decisions  Exploration of goals of care in the event of a sudden injury or illness   CODE STATUS: DNR  Palliative Medicine will continue to provide supportive care, symptom management as needed. Code status reviewed; DNR.  Patient not feeling well last week, weaker. Discussed hx of strokes, aspiration.    Symptom Management/Plan:  Generalized weakness- secondary to recurrent strokes, vascular dementia. Wife to assist with adl's, caregiver once a week. Recommend PT to evaluate and treat due to increased weakness, assist with transfers, gait.   Pain-patient with generalized pain, chronic back pain. Continue norco BID PRN. Discussed alternating with acetaminophen; education on max dose of 3000 mg /day.   Follow up Palliative Care Visit: Palliative care will continue to follow for complex medical decision making, advance care planning, and clarification of goals. Return in 8 weeks or prn.   This visit was coded based on medical decision making (MDM).  PPS: 40%  HOSPICE ELIGIBILITY/DIAGNOSIS: TBD  Chief Complaint: Palliative Medicine follow up visit.   HISTORY OF PRESENT ILLNESS:  Adesh Freidel is a 76 y.o. year old male  with dementia, recurrent strokes, hypertension, hyperlipidemia, obesity. Patient hospitalized 8/17-8/19/23 due to acute respiratory failure w/hypoxia, septic shock secondary to aspiration pneumonia.   Patient resides at home with wife. He has increased weakness since hospitalization. Last week he had much difficulty, wife notes some improvement in past couple of days, more cooperative. He has generalized pain, chronic back pain. Denies shortness of breath. Nausea improved. Appetite is good, depending on what wife prepares. He is sleeping good; takes mirtazapine.   History obtained from review of EMR, discussion with primary team, and interview with family, facility staff/caregiver and/or Maurice Moses.  I reviewed available labs, medications, imaging, studies and related documents from the EMR.  Records reviewed and summarized above.   ROS  A 10 Point ROS is negative, except for the pertinent positives/negatives detailed per the HPI.   Physical Exam: See palliative RN note.    Thank you for  the opportunity to participate in the care of Maurice Moses.  The palliative care team will continue to follow. Please call our office at [phone] if we can be of additional assistance.   Ezekiel Slocumb, NP   COVID-19 PATIENT SCREENING TOOL Asked and negative response unless otherwise noted:   Have you had symptoms of covid, tested positive or been in contact with someone with symptoms/positive test in the past 5-10 days?

## 2022-05-09 NOTE — Progress Notes (Signed)
PATIENT NAME: Tyrin Herbers DOB: 1946/08/10 MRN: [id number]  PRIMARY CARE PROVIDER: Gladstone Lighter, MD  RESPONSIBLE PARTY:  Acct ID - Guarantor Home Phone Work Phone Relationship Acct Type  [id number] BURK, [id number]  Self P/F     [place], [place], [place] [address]    Home visit completed with patient and wife Amy.  Connected virtually with Charlann Boxer, NP.  See NP assessment note.   CODE STATUS: DNR ADVANCED DIRECTIVES: Yes MOST FORM: No PPS: 40%   PHYSICAL EXAM:   VITALS: Today's Vitals   05/09/22 1247  BP: (!) 142/84  Pulse: (!) 56  Temp: (!) 97.4 F (36.3 C)  SpO2: 95%  PainSc: 0-No pain    LUNGS: clear to auscultation  CARDIAC: Cor RRR}  EXTREMITIES: - for edema SKIN: Skin color, texture, turgor normal. No rashes or lesions or normal  NEURO: positive for gait problems and memory problems       Lorenza Burton, RN

## 2022-05-25 ENCOUNTER — Emergency Department: Payer: Medicare PPO

## 2022-05-25 ENCOUNTER — Emergency Department
Admission: EM | Admit: 2022-05-25 | Discharge: 2022-05-29 | Disposition: A | Discharge status: Home / Self Care | Payer: Medicare PPO | Attending: Emergency Medicine | Admitting: Emergency Medicine

## 2022-05-25 DIAGNOSIS — R410 Disorientation, unspecified: Secondary | ICD-10-CM | POA: Insufficient documentation

## 2022-05-25 DIAGNOSIS — Z515 Encounter for palliative care: Secondary | ICD-10-CM | POA: Diagnosis not present

## 2022-05-25 DIAGNOSIS — F03918 Unspecified dementia, unspecified severity, with other behavioral disturbance: Secondary | ICD-10-CM | POA: Diagnosis present

## 2022-05-25 DIAGNOSIS — Z20822 Contact with and (suspected) exposure to covid-19: Secondary | ICD-10-CM | POA: Insufficient documentation

## 2022-05-25 DIAGNOSIS — R627 Adult failure to thrive: Secondary | ICD-10-CM | POA: Diagnosis not present

## 2022-05-25 DIAGNOSIS — F03C18 Unspecified dementia, severe, with other behavioral disturbance: Secondary | ICD-10-CM

## 2022-05-25 DIAGNOSIS — R531 Weakness: Secondary | ICD-10-CM | POA: Insufficient documentation

## 2022-05-25 DIAGNOSIS — Z7189 Other specified counseling: Secondary | ICD-10-CM | POA: Diagnosis not present

## 2022-05-25 LAB — URINALYSIS, ROUTINE W REFLEX MICROSCOPIC
Bilirubin Urine: NEGATIVE
Glucose, UA: NEGATIVE mg/dL
Hgb urine dipstick: NEGATIVE
Ketones, ur: 5 mg/dL — AB
Leukocytes,Ua: NEGATIVE
Nitrite: NEGATIVE
Protein, ur: 30 mg/dL — AB
Specific Gravity, Urine: 1.019 (ref 1.005–1.030)
pH: 7 (ref 5.0–8.0)

## 2022-05-25 LAB — DIFFERENTIAL
Abs Immature Granulocytes: 0.03 10*3/uL (ref 0.00–0.07)
Basophils Absolute: 0.1 10*3/uL (ref 0.0–0.1)
Basophils Relative: 1 %
Eosinophils Absolute: 0.3 10*3/uL (ref 0.0–0.5)
Eosinophils Relative: 3 %
Immature Granulocytes: 0 %
Lymphocytes Relative: 16 %
Lymphs Abs: 1.6 10*3/uL (ref 0.7–4.0)
Monocytes Absolute: 0.5 10*3/uL (ref 0.1–1.0)
Monocytes Relative: 5 %
Neutro Abs: 7.2 10*3/uL (ref 1.7–7.7)
Neutrophils Relative %: 75 %

## 2022-05-25 LAB — CBC
HCT: 48.6 % (ref 39.0–52.0)
Hemoglobin: 16 g/dL (ref 13.0–17.0)
MCH: 30.3 pg (ref 26.0–34.0)
MCHC: 32.9 g/dL (ref 30.0–36.0)
MCV: 92 fL (ref 80.0–100.0)
Platelets: 289 10*3/uL (ref 150–400)
RBC: 5.28 MIL/uL (ref 4.22–5.81)
RDW: 13 % (ref 11.5–15.5)
WBC: 9.6 10*3/uL (ref 4.0–10.5)
nRBC: 0 % (ref 0.0–0.2)

## 2022-05-25 LAB — RESP PANEL BY RT-PCR (FLU A&B, COVID) ARPGX2
Influenza A by PCR: NEGATIVE
Influenza B by PCR: NEGATIVE
SARS Coronavirus 2 by RT PCR: NEGATIVE

## 2022-05-25 LAB — COMPREHENSIVE METABOLIC PANEL
ALT: 25 U/L (ref 0–44)
AST: 16 U/L (ref 15–41)
Albumin: 4.2 g/dL (ref 3.5–5.0)
Alkaline Phosphatase: 111 U/L (ref 38–126)
Anion gap: 12 (ref 5–15)
BUN: 17 mg/dL (ref 8–23)
CO2: 21 mmol/L — ABNORMAL LOW (ref 22–32)
Calcium: 10.3 mg/dL (ref 8.9–10.3)
Chloride: 108 mmol/L (ref 98–111)
Creatinine, Ser: 1.18 mg/dL (ref 0.61–1.24)
GFR, Estimated: >60 mL/min (ref >60)
Glucose, Bld: 100 mg/dL — ABNORMAL HIGH (ref 70–99)
Potassium: 4.3 mmol/L (ref 3.5–5.1)
Sodium: 141 mmol/L (ref 135–145)
Total Bilirubin: 0.9 mg/dL (ref 0.3–1.2)
Total Protein: 7.3 g/dL (ref 6.5–8.1)

## 2022-05-25 LAB — APTT: aPTT: 29 seconds (ref 24–36)

## 2022-05-25 LAB — PROTIME-INR
INR: 1 (ref 0.8–1.2)
Prothrombin Time: 12.9 seconds (ref 11.4–15.2)

## 2022-05-25 LAB — TSH: TSH: 2.228 u[IU]/mL (ref 0.350–4.500)

## 2022-05-25 LAB — LACTIC ACID, PLASMA: Lactic Acid, Venous: 1.4 mmol/L (ref 0.5–1.9)

## 2022-05-25 LAB — ETHANOL: Alcohol, Ethyl (B): <10 mg/dL (ref <10)

## 2022-05-25 MED ORDER — MIRTAZAPINE 15 MG PO TBDP
30.0000 mg | ORAL_TABLET | Freq: Every day | ORAL | Status: DC
  Filled 2022-05-25 (×5): qty 2

## 2022-05-25 MED ORDER — VITAMIN D 25 MCG (1000 UNIT) PO TABS
2000.0000 [IU] | ORAL_TABLET | Freq: Every day | ORAL | Status: DC
  Filled 2022-05-25: qty 2

## 2022-05-25 MED ORDER — CLOPIDOGREL BISULFATE 75 MG PO TABS
75.0000 mg | ORAL_TABLET | Freq: Every day | ORAL | Status: DC
  Filled 2022-05-25 (×2): qty 1

## 2022-05-25 MED ORDER — ASPIRIN 81 MG PO TBEC
81.0000 mg | DELAYED_RELEASE_TABLET | Freq: Every day | ORAL | Status: DC
  Filled 2022-05-25 (×2): qty 1

## 2022-05-25 MED ORDER — ATORVASTATIN CALCIUM 20 MG PO TABS
40.0000 mg | ORAL_TABLET | Freq: Every day | ORAL | Status: DC
  Filled 2022-05-25: qty 2

## 2022-05-25 MED ORDER — DOCUSATE SODIUM 100 MG PO CAPS
300.0000 mg | ORAL_CAPSULE | Freq: Every day | ORAL | Status: DC
  Filled 2022-05-25: qty 3

## 2022-05-25 MED ORDER — LORATADINE 10 MG PO TABS
10.0000 mg | ORAL_TABLET | Freq: Every day | ORAL | Status: DC
  Filled 2022-05-25: qty 1

## 2022-05-25 MED ORDER — SODIUM CHLORIDE 0.9% FLUSH: 3.0000 mL | Freq: Once | INTRAVENOUS | Status: DC

## 2022-05-25 MED ORDER — SODIUM CHLORIDE 0.9 % IV BOLUS
500.0000 mL | Freq: Once | INTRAVENOUS | Status: AC
  Administered 2022-05-25: 500 mL via INTRAVENOUS

## 2022-05-25 MED ORDER — ESCITALOPRAM OXALATE 10 MG PO TABS
20.0000 mg | ORAL_TABLET | Freq: Every evening | ORAL | Status: DC
  Filled 2022-05-25 (×2): qty 2

## 2022-05-25 MED ORDER — PANTOPRAZOLE SODIUM 40 MG PO TBEC
40.0000 mg | DELAYED_RELEASE_TABLET | Freq: Every day | ORAL | Status: DC
  Filled 2022-05-25 (×2): qty 1

## 2022-05-25 MED ORDER — HYDROCODONE-ACETAMINOPHEN 5-325 MG PO TABS: 1.0000 | ORAL_TABLET | Freq: Four times a day (QID) | ORAL | Status: DC | PRN

## 2022-05-25 MED ORDER — LISINOPRIL 10 MG PO TABS
20.0000 mg | ORAL_TABLET | Freq: Every day | ORAL | Status: DC
  Filled 2022-05-25 (×2): qty 2

## 2022-05-25 NOTE — ED Provider Notes (Addendum)
[facility] Provider Note    Event Date/Time   First MD Initiated Contact with Patient 05/25/22 1202     (approximate)   History   Weakness   HPI  Maurice Moses is a 76 y.o. male with a history of CVA, dysphagia, and aspiration pneumonia who presents with increased generalized weakness and overall deterioration in his functional status over approximately last month.  The patient was admitted for aspiration pneumonia in August and the wife states that since he was sent home at that time he has gradually declined and become weaker.  Over the last week he has gotten particularly worse and now can no longer transfer from bed to chair.  The patient himself is confused but is unable to state any specific complaints.  I reviewed the past medical records.  I confirmed that the patient was admitted in August and per the hospitalist discharge summary from 8/19 he was treated for septic shock and aspiration pneumonia.  Physical Exam   Triage Vital Signs: ED Triage Vitals  Enc Vitals Group     BP 05/25/22 1123 (!) 137/103     Pulse Rate 05/25/22 1123 71     Resp 05/25/22 1123 18     Temp 05/25/22 1123 98.7 F (37.1 C)     Temp src --      SpO2 05/25/22 1123 92 %     Weight --      Height --      Head Circumference --      Peak Flow --      Pain Score 05/25/22 1220 0     Pain Loc --      Pain Edu? --      Excl. in [place]? --     Most recent vital signs: Vitals:   05/25/22 1530 05/25/22 1600  BP:    Pulse: (!) 56 76  Resp:  14  Temp:    SpO2:       General: Awake, oriented x1. CV:  Good peripheral perfusion.  Resp:  Normal effort.  Abd:  No distention.  Other:  EOMI.  PERRLA.  Motor intact in all extremities.   ED Results / Procedures / Treatments   Labs (all labs ordered are listed, but only abnormal results are displayed) Labs Reviewed  COMPREHENSIVE METABOLIC PANEL - Abnormal; Notable for the following components:      Result Value   CO2 21  (*)    Glucose, Bld 100 (*)    All other components within normal limits  URINALYSIS, ROUTINE W REFLEX MICROSCOPIC - Abnormal; Notable for the following components:   Color, Urine YELLOW (*)    APPearance CLOUDY (*)    Ketones, ur 5 (*)    Protein, ur 30 (*)    Bacteria, UA RARE (*)    All other components within normal limits  PROTIME-INR  APTT  CBC  DIFFERENTIAL  ETHANOL  LACTIC ACID, PLASMA  TSH  CBG MONITORING, ED     EKG  ED ECG REPORT I, Arta Silence, the attending physician, personally viewed and interpreted this ECG.  Date: 05/25/2022 EKG Time: 1126 Rate: 65 Rhythm: normal sinus rhythm QRS Axis: Left axis Intervals: normal ST/T Wave abnormalities: Nonspecific abnormalities Narrative Interpretation: no evidence of acute ischemia    RADIOLOGY  CT head: I independently viewed and interpreted the images; there is no ICH or evidence of acute stroke  Chest x-ray: I independently viewed and interpreted the images; there is no focal consolidation or  edema  PROCEDURES:  Critical Care performed: No  Procedures   MEDICATIONS ORDERED IN ED: Medications  sodium chloride flush (NS) 0.9 % injection 3 mL (has no administration in time range)  sodium chloride 0.9 % bolus 500 mL (500 mLs Intravenous New Bag/Given 05/25/22 1430)     IMPRESSION / MDM / ASSESSMENT AND PLAN / ED COURSE  I reviewed the triage vital signs and the nursing notes.  76 year old male with PMH as noted above presents with gradual onset of generalized weakness and increased confusion over approximately last 2 months, with acute worsening over the last week.  He has no focal symptoms.  Physical exam is unremarkable for any acute findings.  Differential diagnosis includes, but is not limited to, worsening dementia, deconditioning, dehydration, AKI, other metabolic disturbance, acute to subacute stroke, urinary tract infection or other infectious etiology.  Initial work-up is reassuring.   There is no leukocytosis.  Electrolytes are normal.  CT head is negative.  I have added on a urinalysis and TSH.  Based on my discussion with the wife, she is no longer able to take care of the patient at home.  If the work-up is negative for acute findings that would warrant admission, the patient will likely need SNF placement.  Patient's presentation is most consistent with acute presentation with potential threat to life or bodily function.  The patient is on the cardiac monitor to evaluate for evidence of arrhythmia and/or significant heart rate changes.  ----------------------------------------- 3:15 PM on 05/25/2022 -----------------------------------------  Urinalysis is still pending.  I have ordered TOC and PT evaluations for likely placement.  If the urinalysis does not show significant findings to suggest UTI, then the patient will remain in the ED awaiting placement.  I have signed him out to the oncoming ED physician Dr. Scotty Court.  ----------------------------------------- 7:05 AM on 06/13/2022 -----------------------------------------   ADDENDUM: Respiratory panel was ordered due to confusion and weakness to rule out covid-19 or influenza as precipitating cause.     FINAL CLINICAL IMPRESSION(S) / ED DIAGNOSES   Final diagnoses:  Generalized weakness     Rx / DC Orders   ED Discharge Orders     None        Note:  This document was prepared using Dragon voice recognition software and may include unintentional dictation errors.    Dionne Bucy, MD 05/25/22 1651    Dionne Bucy, MD 06/13/22 [phone]

## 2022-05-25 NOTE — ED Notes (Signed)
Pt repositioned. Stretcher locked low. Rails up. Call bell within reach. Wife at bedside.

## 2022-05-25 NOTE — ED Notes (Signed)
Shirlee Limerick in lab confirms will add on TSH now.

## 2022-05-25 NOTE — ED Notes (Addendum)
Pt given option to take meds wit water or apple sauce; pt wouldn't take meds whole in apple sauce or crushed. Pt kept refusing and spitting out and trying to throw them across room.

## 2022-05-25 NOTE — ED Triage Notes (Signed)
Ems report: pt ems from home for increasing weakness x 1 week. Family concerned that pt can not sit up this am , usually can. CBG 107, hx stroke.

## 2022-05-25 NOTE — ED Notes (Signed)
Pt's wife seen walking hallway. This RN to bedside moments later to check on pt and his wife; pt asleep and wife laying on R side on recliner trying to sleep. Stretcher locked low; rails up; call bell within reach.

## 2022-05-25 NOTE — ED Notes (Signed)
All blood-work so far collected during triage by other staff members.

## 2022-05-25 NOTE — ED Notes (Addendum)
Monitor removed since pt boarder and will provide more comfort. Wife remains at bedside. Wife given recliner.

## 2022-05-25 NOTE — ED Notes (Signed)
Pt's wife gave pt his home dose of "hydrocodone 5-325" as stated "it will calm him". EDP notified.

## 2022-05-25 NOTE — ED Notes (Signed)
Bladder scanned pt; 0cc noted. EDP Siadecki notified.

## 2022-05-25 NOTE — ED Notes (Signed)
I&O cath completed by this RN with neighboring RN's assistance. Wife assisted near end as pt became very anxious during procedure and tried to hit staff. Staff explained to pt multiple times what procedure was for and attempted to calm pt; was able to collect urine; pt wouldn't allow staff to clean iodine off with bath wipes post procedure or pull up new briefs or pants. Wife states will try to calm pt and assist him to position his briefs and pants herself. Wife states pt has been like this intermittently at home and that it is new for pt to become anxious like this.

## 2022-05-25 NOTE — ED Notes (Addendum)
Non-slip blue socks applied to pt's feet. Pt transferred from wheelchair to stretcher by this RN, triage NT, Optometrist, Scientist, product/process development. Pt tolerated well. Family at bedside. Family reports continued increase in weakness over last week. Pt alert, non-verbal currently, calm, tracking. Once transferred to stretcher pt immediately left for imaging.

## 2022-05-25 NOTE — ED Notes (Signed)
Attempted for 20g at R fa.  

## 2022-05-25 NOTE — ED Notes (Addendum)
Pt able to reply verbally when asked about pain; pt denied. Pt otherwise will not answer questions. Wife reports stroke, fall with a head bleed and TIAx2 in pt's history with aphasia that is intermittent as sometimes pt can answer a question asked or "chuckles" like himself and other times doesn't seem to understand what is being asked or cannot reply verbally. Pt's wife states pt has dec water intake for a week.

## 2022-05-25 NOTE — ED Notes (Signed)
Pt's monitor switched to comfort care to keep it quiet in pt's room bc wife had previously taken portions on monitor off and then turned it off in attempt to silence it. Educated pt's wife.

## 2022-05-25 NOTE — ED Notes (Signed)
Wife remains at bedside with pt; both trying to rest/sleep.

## 2022-05-26 ENCOUNTER — Other Ambulatory Visit: Payer: Self-pay

## 2022-05-26 NOTE — ED Notes (Signed)
PT with pt.

## 2022-05-26 NOTE — ED Notes (Signed)
Pt in ED stretcher with blankets. Wife at bedside. Pt is sleeping with chest rise and fall noted, opens eyes to speech. NAD noted.

## 2022-05-26 NOTE — ED Notes (Signed)
Given additional blanket at this time. Patient offers no other complaints. Will continue to frequently monitor. Wife is currently at home and will return later this morning

## 2022-05-26 NOTE — ED Provider Notes (Signed)
-----------------------------------------   6:30 AM on 05/26/2022 -----------------------------------------   Blood pressure (!) 174/89, pulse (!) 55, temperature 97.9 F (36.6 C), temperature source Oral, resp. rate 18, SpO2 98 %.  The patient is calm and cooperative at this time.  There have been no acute events since the last update.  Awaiting disposition plan from Social Work team.   Paulette Blanch, MD 05/26/22 [phone]

## 2022-05-26 NOTE — NC FL2 (Signed)
[place] MEDICAID FL2 LEVEL OF CARE SCREENING TOOL     IDENTIFICATION  Patient Name: Maurice Moses Birthdate: January 31, 1946 Sex: male Admission Date (Current Location): 05/25/2022  [facility] and [place] Number:  Chiropodist and Address:  [facility], [address] [place], [place] [address]      Provider Number: [id number]  Attending Physician Name and Address:  No att. 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 (HCC) 01/27/2020   Stroke (HCC) 01/24/2020   Acute metabolic encephalopathy 01/24/2020   Nausea & vomiting 01/24/2020   GERD (gastroesophageal reflux disease)    Hypokalemia    Aspiration pneumonia (HCC)    Chronic fatigue 10/30/2019   Abnormal EKG 09/25/2019   Hyperlipidemia LDL goal <70 09/25/2019   Essential hypertension 09/25/2019   Aphasia 08/14/2019   Recurrent strokes (HCC) 11/26/2016   Cerebral infarction (HCC) 07/24/2015   TIA (transient ischemic attack) 07/23/2015    Orientation RESPIRATION BLADDER Height & Weight     Self  Normal Incontinent Weight:   Height:     BEHAVIORAL SYMPTOMS/MOOD NEUROLOGICAL BOWEL NUTRITION STATUS      Incontinent Diet (DC summary)  AMBULATORY STATUS COMMUNICATION OF NEEDS Skin   Total Care Verbally Normal                       Personal Care Assistance Level of Assistance  Bathing, Feeding, Dressing Bathing Assistance: Maximum assistance Feeding assistance: Maximum assistance Dressing Assistance: Maximum assistance      Functional Limitations Info  Sight, Hearing, Speech Sight Info: Adequate Hearing Info: Adequate Speech Info: Impaired (Dysphagia)    SPECIAL CARE FACTORS FREQUENCY  PT (By licensed PT), OT (By licensed OT)     PT Frequency: 5x week OT Frequency: 5x week            Contractures Contractures Info: Not present    Additional Factors Info  Code Status, Allergies, Psychotropic Code Status Info: Prior/ TBD Allergies Info: Morphine And Related,  Doxycycline Psychotropic Info: Escitalopram         Current Medications (05/26/2022):  This is the current hospital active medication list Current Facility-Administered Medications  Medication Dose Route Frequency Provider Last Rate Last Admin   aspirin EC tablet 81 mg  81 mg Oral Daily Sharman Cheek, MD       atorvastatin (LIPITOR) tablet 40 mg  40 mg Oral q1800 Sharman Cheek, MD       cholecalciferol (VITAMIN D3) 25 MCG (1000 UNIT) tablet 2,000 Units  2,000 Units Oral Daily Sharman Cheek, MD       clopidogrel (PLAVIX) tablet 75 mg  75 mg Oral Daily Sharman Cheek, MD       docusate sodium (COLACE) capsule 300 mg  300 mg Oral Daily Sharman Cheek, MD       escitalopram (LEXAPRO) tablet 20 mg  20 mg Oral QPM Sharman Cheek, MD       HYDROcodone-acetaminophen (NORCO/VICODIN) 5-325 MG per tablet 1 tablet  1 tablet Oral Q6H PRN Sharman Cheek, MD  lisinopril (ZESTRIL) tablet 20 mg  20 mg Oral Daily Carrie Mew, MD       loratadine (CLARITIN) tablet 10 mg  10 mg Oral Daily Carrie Mew, MD       mirtazapine (REMERON SOL-TAB) disintegrating tablet 30 mg  30 mg Oral Daily Carrie Mew, MD       pantoprazole (PROTONIX) EC tablet 40 mg  40 mg Oral Daily Carrie Mew, MD       sodium chloride flush (NS) 0.9 % injection 3 mL  3 mL Intravenous Once Arta Silence, MD       Current Outpatient Medications  Medication Sig Dispense Refill   aspirin EC 81 MG EC tablet Take 1 tablet (81 mg total)  by mouth daily.     atorvastatin (LIPITOR) 40 MG tablet Take 1 tablet (40 mg total) by mouth daily at 6 PM. 40 tablet 0   cholecalciferol (VITAMIN D) 1000 units tablet Take 2,000 Units by mouth daily.     clopidogrel (PLAVIX) 75 MG tablet Take 1 tablet (75 mg total) by mouth daily.  0   docusate sodium (COLACE) 100 MG capsule Take 300 mg by mouth daily.      escitalopram (LEXAPRO) 20 MG tablet Take 20 mg by mouth every evening.      esomeprazole (NEXIUM) 40 MG capsule Take 40 mg by mouth every evening.      HYDROcodone-acetaminophen (NORCO/VICODIN) 5-325 MG tablet Take 1 tablet by mouth every 4 (four) hours as needed for moderate pain (Max 7 per day). 15 tablet 0   lisinopril (ZESTRIL) 20 MG tablet Take 20 mg by mouth daily.     amoxicillin-clavulanate (AUGMENTIN) 875-125 MG tablet Take 1 tablet by mouth 2 (two) times daily. (Patient not taking: Reported on 05/25/2022) 9 tablet 0   cetirizine (ZYRTEC) 10 MG tablet Take 10 mg by mouth daily.     hydrochlorothiazide (HYDRODIURIL) 25 MG tablet Take 25 mg by mouth daily. (Patient not taking: Reported on 05/25/2022)     mirtazapine (REMERON SOL-TAB) 30 MG disintegrating tablet Take 30 mg by mouth daily.      triamcinolone cream (KENALOG) 0.1 % Apply topically.       Discharge Medications: Please see discharge summary for a list of discharge medications.  Relevant Imaging Results:  Relevant Lab Results:   Additional Information SS# [id number]  Coralee [place], [place]

## 2022-05-26 NOTE — TOC Initial Note (Signed)
Transition of Care [facility]) - Initial/Assessment Note    Patient Details  Name: Maurice Moses MRN: [id number] Date of Birth: 05-31-1946  Transition of Care [facility]) CM/SW Contact:    Coralee Pesa, [place] Phone Number: 05/26/2022, 2:21 PM  Clinical Narrative:                 CSW was notified by PT that pt was unable to participate in therapies, and spouse has questions about options and next steps. CSW met with pt and spouse, Maurice, at bedside. Maurice states pt has had several strokes and pneumonia. He is able to ambulate from bed to his chair, using a walker, but does not do much else. They have an aid that helps, but pt has been able to remain at home with his function level.  Spouse reports that pt had a recent decline, where he was unable to even sit up. She could not move him, so she brought him to the ED. Maurice thought he had another stroke or an infection, but she states all of his tests have come home negative, and she is unsure of why pt declined all of a sudden. Souse states that her number one goal is to get pt home, if at all possible. CSW and spouse reviewed how pt did with physical therapy. Safety was discussed, as his current condition would require a lot of care. Pt and family do not have any outside supports. Maurice notes that she knows it is unsafe to take him home as he is. CSW discussed options including SNF, LTC, Hospice, HH, questions answered about all avenues including insurance and payment. Spouse discussed the difficulties she is having with making these choices and emotional support were offered for her feelings of guilt and grief. All options were considered and spouse notes the best option is to proceed with SNF placement with palliative care. As pt is currently unable to participate in therapies at this time, insurance would likely not approve a SNF stay. Pt has a LTC benefit, spouse is unable to determine when that will kick in, but she states it should be soon. Spouse states she can pay  out of pocket until the benefit kicks in. She was advised that she would still likely need to pay out of pocket for the skilled therapies if he continued to need them. CSW discussed Medicaid with spouse. PT states they will see him several more times to see if he would qualify for an authorization. Topics of hospice vs. Palliative were discussed. Pt is currently a palliative pt with [facility]. Spouse is interested in discussing the options, CSW will update [facility] and medical team. Spouse states pt has been to [facility] in [place], but she would prefer he be in [place]. She is interested in [facility], but is agreeable to a further faxout. Medicare.gov info was provided. CSW to send referrals, TOC will continue to follow for disposition needs.  Expected Discharge Plan: Skilled Nursing Facility Barriers to Discharge: Continued Medical Work up, Ship broker, Unsafe home situation, SNF Pending bed offer, ED Unsafe disposition, ED SNF auth   Patient Goals and CMS Choice Patient states their goals for this hospitalization and ongoing recovery are:: Pt is disoriented and unable to participate in goal setting. CMS Medicare.gov Compare Post Acute Care list provided to:: Patient Represenative (must comment) (Spouse) Choice offered to / list presented to : Spouse  Expected Discharge Plan and Services Expected Discharge Plan: [place] In-house Referral: Clinical Social Work, Hospice / Palliative Care  Post Acute Care Choice: [place] Living arrangements for the past 2 months: Single Family Home                                      Prior Living Arrangements/Services Living arrangements for the past 2 months: Single Family Home Lives with:: Spouse Patient language and need for interpreter reviewed:: Yes Do you feel safe going back to the place where you live?: Yes      Need for Family Participation in Patient Care: Yes (Comment) Care  giver support system in place?: Yes (comment) Current home services: DME, Homehealth aide Criminal Activity/Legal Involvement Pertinent to Current Situation/Hospitalization: No - Comment as needed  Activities of Daily Living      Permission Sought/Granted Permission sought to share information with : Family Supports Permission granted to share information with : Yes, Verbal Permission Granted  Share Information with NAME: Maurice Moses     Permission granted to share info w Relationship: Souse  Permission granted to share info w Contact Information: [phone]  Emotional Assessment Appearance:: Appears stated age Attitude/Demeanor/Rapport: Unable to Assess Affect (typically observed): Unable to Assess Orientation: :  (UTA) Alcohol / Substance Use: Not Applicable Psych Involvement: No (comment)  Admission diagnosis:  weakness, ems Patient Active Problem List   Diagnosis Date Noted   Obesity (BMI 30-39.9) 04/05/2022   Abnormal CT of the abdomen 04/05/2022   Acute encephalopathy 04/23/2021   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 ([place]) 01/27/2020   Stroke ([place]) 20/35/5974   Acute metabolic encephalopathy 16/38/4536   Nausea & vomiting 01/24/2020   GERD (gastroesophageal reflux disease)    Hypokalemia    Aspiration pneumonia (HCC)    Chronic fatigue 10/30/2019   Abnormal EKG 09/25/2019   Hyperlipidemia LDL goal <70 09/25/2019   Essential hypertension 09/25/2019   Aphasia 08/14/2019   Recurrent strokes ([place]) 11/26/2016   Cerebral infarction ([place]) 07/24/2015   TIA (transient ischemic attack) 07/23/2015   PCP:  Gladstone Lighter, MD Pharmacy:   [facility], [address] [place] [address] Phone: [phone] Fax: [phone]  [id number] [address], [address] [place] [address] Phone: [phone] Fax:  [phone]     Social Determinants of Health (SDOH) Interventions    Readmission Risk Interventions     No data to display

## 2022-05-26 NOTE — ED Notes (Signed)
Pt moved to hospital bed, Pt changed into hospital gown and socks, brief cleaned of urine, fresh chux and brief applied. Purple/green bruise noted on right hip- wife states was from past fall.

## 2022-05-26 NOTE — Evaluation (Signed)
Physical Therapy Evaluation Patient Details Name: Maurice Moses MRN: [id number] DOB: September 09, 1945 Today's Date: 05/26/2022  History of Present Illness  Per MD:  Pt is a 75 y.o. male with a history of CVA, dysphagia, and aspiration pneumonia who presents with increased generalized weakness and overall deterioration in his functional status over approximately last month.  The patient was admitted for aspiration pneumonia in August and the wife states that since he was sent home at that time he has gradually declined and become weaker.  Over the last week he has gotten particularly worse and now can no longer transfer from bed to chair.    Clinical Impression  Pt received in supine position with spouse, Amy, in room, lights off upon arrival.  Pt sleeping upon entering and PLOf and background information obtained from spouse.  Pt was ambulating at home with care giver and spouse, when approximately a week ago made a down turn and was unable to ambulate as well.  Pt decreased to the point where he was unable to sit up, which is when he was sent to the ED.  Pt's wife notes pt has baseline aphasia (responds better to yes/no format), is easily distracted by surrounding environment, and is dyslexic.    Pt unable to participate in therapy session with formal exercises and transfer training at this time, likely due to grogginess and just not responding to tasks over the past week.  Pt and wife were educated on PT trial for 3 visits and would consult with [facility] team for d/c options going forward.  LTC likely to be best discharge option for pt, but unsure of insurance coverage for pt.  Will continue to work closely with pt, spouse, and TOC team for improved QoL and return to PLOF.      Recommendations for follow up therapy are one component of a multi-disciplinary discharge planning process, led by the attending physician.  Recommendations may be updated based on patient status, additional functional criteria and  insurance authorization.  Follow Up Recommendations PT at Long-term acute care hospital      Assistance Recommended at Discharge Frequent or constant Supervision/Assistance  Patient can return home with the following  Two people to help with walking and/or transfers;Two people to help with bathing/dressing/bathroom;Assistance with cooking/housework;Assistance with feeding;Direct supervision/assist for medications management;Direct supervision/assist for financial management;Assist for transportation;Help with stairs or ramp for entrance    Equipment Recommendations None recommended by PT  Recommendations for Other Services       Functional Status Assessment Patient has had a recent decline in their functional status and demonstrates the ability to make significant improvements in function in a reasonable and predictable amount of time.     Precautions / Restrictions Precautions Precautions: None Restrictions Weight Bearing Restrictions: No      Mobility  Bed Mobility               General bed mobility comments: unable to perform at this time.    Transfers                        Ambulation/Gait                  Stairs            Wheelchair Mobility    Modified Rankin (Stroke Patients Only)       Balance  Pertinent Vitals/Pain Pain Assessment Pain Assessment: No/denies pain    Home Living Family/patient expects to be discharged to:: Private residence Living Arrangements: Spouse/significant other Available Help at Discharge: Family;Personal care attendant;Available 24 hours/day;Available PRN/intermittently Type of Home: House Home Access: Ramped entrance       Home Layout: Able to live on main level with bedroom/bathroom;One level Home Equipment: Conservation officer, nature (2 wheels);Wheelchair - manual      Prior Function Prior Level of Function : Needs assist        Physical Assist : Mobility (physical);ADLs (physical) Mobility (physical): Gait ADLs (physical): Bathing;Dressing;Toileting;IADLs Mobility Comments: PCA provides Supv-SBA during mobility with RW; however over the past week, has not been able to perform       Hand Dominance   Dominant Hand: Right    Extremity/Trunk Assessment   Upper Extremity Assessment Upper Extremity Assessment: Difficult to assess due to impaired cognition    Lower Extremity Assessment Lower Extremity Assessment: Difficult to assess due to impaired cognition       Communication   Communication: Expressive difficulties  Cognition Arousal/Alertness: Lethargic   Overall Cognitive Status: History of cognitive impairments - at baseline Area of Impairment: Orientation, Following commands, Awareness                 Orientation Level: Disoriented to, Place, Time, Situation             General Comments: Pt unable to follow commands at this date compared to previous admission.        General Comments      Exercises     Assessment/Plan    PT Assessment Patient needs continued PT services (Trial of PT for 3 sessions)  PT Problem List Decreased strength;Decreased activity tolerance;Decreased mobility       PT Treatment Interventions DME instruction;Gait training;Functional mobility training;Therapeutic activities;Therapeutic exercise;Balance training    PT Goals (Current goals can be found in the Care Plan section)  Acute Rehab PT Goals PT Goal Formulation: Patient unable to participate in goal setting Time For Goal Achievement: 06/09/22    Frequency Min 2X/week     Co-evaluation               AM-PAC PT "6 Clicks" Mobility  Outcome Measure Help needed turning from your back to your side while in a flat bed without using bedrails?: Total Help needed moving from lying on your back to sitting on the side of a flat bed without using bedrails?: Total Help needed moving to and from  a bed to a chair (including a wheelchair)?: Total Help needed standing up from a chair using your arms (e.g., wheelchair or bedside chair)?: Total Help needed to walk in hospital room?: Total Help needed climbing 3-5 steps with a railing? : Total 6 Click Score: 6    End of Session   Activity Tolerance: Patient limited by lethargy Patient left: in bed;with call bell/phone within reach;with family/visitor present Nurse Communication: Mobility status PT Visit Diagnosis: Unsteadiness on feet (R26.81);Muscle weakness (generalized) (M62.81);Adult, failure to thrive (R62.7)    Time: 1202-1224 PT Time Calculation (min) (ACUTE ONLY): 22 min   Charges:   PT Evaluation $PT Eval Moderate Complexity: 1 Mod          Gwenlyn Saran, PT, DPT 05/26/22, 2:10 PM

## 2022-05-26 NOTE — ED Notes (Signed)
Pt's brief cleaned of urine. Pt grabs onto rail, bedding, and staff hand tightly when attempting to provide care.

## 2022-05-27 NOTE — ED Notes (Signed)
Repositioned onto right side. Call light in reach. Rails up for safety.

## 2022-05-27 NOTE — ED Notes (Signed)
Incontinent peri care provided.  Pt repositioned for comfort.  Rails up for safety.  Call light in reach.

## 2022-05-27 NOTE — ED Notes (Signed)
Pt's wife at bedside. Pt will not take medications. Prior nurses attempted to give meds varies ways but not successful. Pt's wife at bedside states "its not worth trying, he will not take them".

## 2022-05-28 ENCOUNTER — Encounter: Payer: Self-pay | Admitting: Primary Care

## 2022-05-28 DIAGNOSIS — R531 Weakness: Secondary | ICD-10-CM | POA: Diagnosis not present

## 2022-05-28 DIAGNOSIS — Z7189 Other specified counseling: Secondary | ICD-10-CM

## 2022-05-28 DIAGNOSIS — Z515 Encounter for palliative care: Secondary | ICD-10-CM

## 2022-05-28 MED ORDER — GLYCOPYRROLATE 1 MG PO TABS: 1.0000 mg | ORAL_TABLET | ORAL | Status: DC | PRN

## 2022-05-28 MED ORDER — ONDANSETRON 4 MG PO TBDP: 4.0000 mg | ORAL_TABLET | Freq: Four times a day (QID) | ORAL | Status: DC | PRN

## 2022-05-28 MED ORDER — ACETAMINOPHEN 325 MG PO TABS: 650.0000 mg | ORAL_TABLET | Freq: Four times a day (QID) | ORAL | Status: DC | PRN

## 2022-05-28 MED ORDER — HYDROMORPHONE HCL 1 MG/ML PO LIQD
0.5000 mg | ORAL | Status: DC | PRN
  Administered 2022-05-28 – 2022-05-29 (×4): 1 mg via ORAL
  Filled 2022-05-28 (×8): qty 1

## 2022-05-28 MED ORDER — ACETAMINOPHEN 325 MG RE SUPP: 650.0000 mg | Freq: Four times a day (QID) | RECTAL | Status: DC | PRN

## 2022-05-28 MED ORDER — HALOPERIDOL LACTATE 2 MG/ML PO CONC: 0.5000 mg | ORAL | Status: DC | PRN

## 2022-05-28 MED ORDER — LORAZEPAM 2 MG/ML PO CONC: 1.0000 mg | ORAL | Status: DC | PRN

## 2022-05-28 MED ORDER — BIOTENE DRY MOUTH MT LIQD: 15.0000 mL | OROMUCOSAL | Status: DC | PRN

## 2022-05-28 MED ORDER — ONDANSETRON HCL 4 MG/2ML IJ SOLN: 4.0000 mg | Freq: Four times a day (QID) | INTRAMUSCULAR | Status: DC | PRN

## 2022-05-28 MED ORDER — GLYCOPYRROLATE 0.2 MG/ML IJ SOLN: 0.2000 mg | INTRAMUSCULAR | Status: DC | PRN

## 2022-05-28 MED ORDER — POLYVINYL ALCOHOL 1.4 % OP SOLN: 1.0000 [drp] | Freq: Four times a day (QID) | OPHTHALMIC | Status: DC | PRN

## 2022-05-28 MED ORDER — HALOPERIDOL 0.5 MG PO TABS: 0.5000 mg | ORAL_TABLET | ORAL | Status: DC | PRN

## 2022-05-28 MED ORDER — HALOPERIDOL LACTATE 5 MG/ML IJ SOLN: 0.5000 mg | INTRAMUSCULAR | Status: DC | PRN

## 2022-05-28 NOTE — ED Notes (Signed)
Spoke with palliative care practitioner who states she will be here in approx 30 minutes to an hour to assess pt and meet the family.

## 2022-05-28 NOTE — ED Notes (Addendum)
Palliative care practitioner, Hulan Fray, messaged about pt.

## 2022-05-28 NOTE — ED Notes (Signed)
Pts wife reports pt ate part of a fruit cup and took 1 sip of water.

## 2022-05-28 NOTE — ED Notes (Signed)
Pt's wife at bedside.

## 2022-05-28 NOTE — ED Notes (Signed)
Wife attempting to feed pt some fruit and pie at this time. Pts wife states "he will not drink anything"

## 2022-05-28 NOTE — Progress Notes (Signed)
Manufacturing engineer [facility]) Hospital Liaison Note  Received request from PMT Provider/ Alford Highland., NP  for family interest in [facility]. Visited patient at bedside and spoke with [place] to confirm interest and explain services.  Approval for Hospice Home is determined by [facility] MD. Once [facility] MD has determined Hospice Home eligibility, [facility] will update hospital staff and family.  Patient has been approved for the Hospice/  Please do not hesitate to call with any hospice related questions.    Thank you for the opportunity to participate in this patient's care.  Daphene Calamity, MSW [facility] Liaison  [phone]

## 2022-05-28 NOTE — Consult Note (Signed)
Consultation Note Date: 05/28/2022   Patient Name: Maurice Moses Moses  DOB: [birth year]  MRN: [id number]  Age / Sex: 76 y.o., male  PCP: Gladstone Lighter, MD Referring Physician: No att. providers found  Reason for Consultation: Establishing goals of care, Hospice Evaluation, and Inpatient hospice referral  HPI/Patient Profile: 76 y.o. male  with past medical history of dementia, recurrent strokes, HTN/HLD, obesity, hospitalized 37 through 75 due to acute respiratory failure, septic shock secondary to aspiration pneumonia admitted on 05/25/2022 with failure to thrive.   Clinical Assessment and Goals of Care: I have reviewed medical records including EPIC notes, labs and imaging, received report from RN, assessed the patient.  Maurice Moses Moses is lying quietly in bed.  His eyes are open, but he does not make eye contact.  He does not interact with me in any meaningful way.  He clearly cannot make his basic needs known.  His wife of 53 years, Maurice Moses, is present at bedside.  We meet at bedside to discuss diagnosis prognosis, GOC, EOL wishes, disposition and options.  I introduced Palliative Medicine as specialized medical care for people living with serious illness. It focuses on providing relief from the symptoms and stress of a serious illness. The goal is to improve quality of life for both the patient and the family.  We discussed a brief life review of the patient.  Mr. and Maurice Moses Moses have been married for 56 years.  She has been his main caregiver for the last 7 years.  We then focused on their current illness. The natural disease trajectory and expectations at EOL were discussed.  The difference between aggressive medical intervention and comfort care was considered in light of the patient's goals of care.  We talk about the concept of "let nature take its course".  Maurice Moses Moses is tearful, but states that she believes it's  time.  She tells me that Maurice Moses Moses is not eating, and they would never accept artificial nutrition and hydration.  Advanced directives, concepts specific to code status, artifical feeding and hydration, and rehospitalization were considered and discussed.  DNR confirmed, orders adjusted.  Hospice and Palliative Care services outpatient were explained and offered.  The Brinker family is active with [facility] for outpatient palliative services.  We talk about transitioning to residential hospice.  Maurice Moses Moses is open to meeting with [facility] representative.  Discussed the importance of continued conversation with family and the medical providers regarding overall plan of care and treatment options, ensuring decisions are within the context of the patient's values and GOCs.  Questions and concerns were addressed. The family was encouraged to call with questions or concerns.  PMT will continue to support holistically.  Conference with bedside nursing staff, transition of care team, inpatient [place] partner related to patient condition, needs, request for residential hospice.   HCPOA NEXT OF KIN -wife of 31 years, Maurice Moses Moses    SUMMARY OF RECOMMENDATIONS   Requesting comfort and dignity at end-of-life, residential hospice with [place] Status/Advance Care Planning: DNR  Symptom Management:  Pain and anxiety medications added End-of-life order set implemented  Palliative Prophylaxis:  Frequent Pain Assessment and Turn Reposition  Additional Recommendations (Limitations, Scope, Preferences): Full Comfort Care  Psycho-social/Spiritual:  Desire for further Chaplaincy support:no Additional Recommendations: Caregiving  Support/Resources and Education on Hospice  Prognosis:  < 2 weeks based on acute and chronic declines, no meaningful food or drink in the last 3 days, family's desire to focus on comfort and dignity, let nature take its course.  Discharge Planning: Transitioning to  residential hospice with [facility] in [place]      Primary Diagnoses: Present on Admission: **None**   I have reviewed the medical record, interviewed the patient and family, and examined the patient. The following aspects are pertinent.  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([place])    Depression    Hx of endoscopy 09/02/2012   Hypertension    IBS (irritable bowel syndrome)    S/P colonoscopy 09/02/2012   Stroke (cerebrum) ([place])    Stroke ([place])    Subdural hemorrhage ([facility])    Social History   Socioeconomic History   Marital status: Married    Spouse name: Not on file   Number of children: Not on file   Years of education: Not on file   Highest education level: Not on file  Occupational History   Not on file  Tobacco Use   Smoking status: Former    Packs/day: 1.00    Years: 30.00    Total pack years: 30.00    Types: Cigarettes    Quit date: 09/22/1977    Years since quitting: 44.7   Smokeless tobacco: Never  Vaping Use   Vaping Use: Never used  Substance and Sexual Activity   Alcohol use: Yes    Alcohol/week: 2.0 standard drinks of alcohol    Types: 2 Glasses of wine per week   Drug use: No   Sexual activity: Not on file  Other Topics Concern   Not on file  Social History Narrative   Not on file   Social Determinants of Health   Financial Resource Strain: Not on file  Food Insecurity: Not on file  Transportation Needs: Not on file  Physical Activity: Not on file  Stress: Not on file  Social Connections: Not on file   Family History  Problem Relation Age of Onset   Hypertension Other    Parkinson's disease Mother    Stomach cancer Father    Hypertension Father    Parkinson's disease Brother    Scheduled Meds:  docusate sodium  300 mg Oral Daily   mirtazapine  30 mg Oral Daily   sodium chloride flush  3 mL Intravenous Once   Continuous Infusions: PRN Meds:.acetaminophen **OR** acetaminophen, antiseptic oral rinse, glycopyrrolate  **OR** glycopyrrolate **OR** glycopyrrolate, haloperidol **OR** haloperidol **OR** haloperidol lactate, HYDROcodone-acetaminophen, HYDROmorphone HCl, LORazepam, ondansetron **OR** ondansetron (ZOFRAN) IV, polyvinyl alcohol Medications Prior to Admission:  Prior to Admission medications   Medication Sig Start Date End Date Taking? Authorizing Provider  aspirin EC 81 MG EC tablet Take 1 tablet (81 mg total) by mouth daily. 07/26/15  Yes Dustin Flock, MD  atorvastatin (LIPITOR) 40 MG tablet Take 1 tablet (40 mg total) by mouth daily at 6 PM. 07/26/15  Yes Dustin Flock, MD  cholecalciferol (VITAMIN D) 1000 units tablet Take 2,000 Units by mouth daily.   Yes [provider]  clopidogrel (PLAVIX) 75 MG tablet Take 1 tablet (75 mg total) by mouth daily. 07/26/15  Yes Dustin Flock, MD  docusate sodium (COLACE) 100 MG capsule Take 300 mg by mouth daily.    Yes [provider]  escitalopram (LEXAPRO) 20 MG tablet Take 20 mg by mouth every evening.    Yes [provider]  esomeprazole (NEXIUM) 40 MG capsule Take 40 mg by mouth every evening.    Yes [provider]  HYDROcodone-acetaminophen (NORCO/VICODIN) 5-325 MG tablet Take 1 tablet by mouth every 4 (four) hours as needed for moderate pain (Max 7 per day). 01/28/20  Yes Dhungel, Nishant, MD  lisinopril (ZESTRIL) 20 MG tablet Take 20 mg by mouth daily. 01/29/22  Yes [provider]  amoxicillin-clavulanate (AUGMENTIN) 875-125 MG tablet Take 1 tablet by mouth 2 (two) times daily. Patient not taking: Reported on 05/25/2022 04/07/22   Annita Brod, MD  cetirizine (ZYRTEC) 10 MG tablet Take 10 mg by mouth daily.    [provider]  hydrochlorothiazide (HYDRODIURIL) 25 MG tablet Take 25 mg by mouth daily. Patient not taking: Reported on 05/25/2022 01/18/22   [provider]  mirtazapine (REMERON SOL-TAB) 30 MG disintegrating tablet Take 30 mg by mouth daily.     [provider]   triamcinolone cream (KENALOG) 0.1 % Apply topically. 02/27/22   [provider]   Allergies  Allergen Reactions   Morphine And Related Other (See Comments)    Agitation - wife does not want him to have it   Doxycycline Nausea And Vomiting   Review of Systems  Unable to perform ROS: Dementia    Physical Exam Vitals and nursing note reviewed.     Vital Signs: BP (!) 159/88   Pulse 65   Temp 98.7 F (37.1 C) (Axillary)   Resp 14   SpO2 92%  Pain Scale: 0-10   Pain Score: Asleep   SpO2: SpO2: 92 % O2 Device:SpO2: 92 % O2 Flow Rate: .   IO: Intake/output summary:  Intake/Output Summary (Last 24 hours) at 05/28/2022 1347 Last data filed at 05/28/2022 0600 Gross per 24 hour  Intake --  Output 500 ml  Net -500 ml    LBM:   Baseline Weight:   Most recent weight:       Palliative Assessment/Data:   Flowsheet Rows    Flowsheet Row Most Recent Value  Intake Tab   Referral Department Hospitalist  Unit at Time of Referral ER  Palliative Care Primary Diagnosis Other (Comment)  Date Notified 05/26/22  Palliative Care Type New Palliative care  Reason for referral Clarify Goals of Care  Date of Admission 05/25/22  Date first seen by Palliative Care 05/28/22  # of days Palliative referral response time 2 Day(s)  # of days IP prior to Palliative referral 1  Clinical Assessment   Palliative Performance Scale Score 20%  Pain Max last 24 hours Not able to report  Pain Min Last 24 hours Not able to report  Dyspnea Max Last 24 Hours Not able to report  Dyspnea Min Last 24 hours Not able to report  Psychosocial & Spiritual Assessment   Palliative Care Outcomes        Time In: 1110 Time Out: 1205 Time Total: 55 minutes  Greater than 50%  of this time was spent counseling and coordinating care related to the above assessment and plan.  Signed by: Drue Novel, NP   Please contact Palliative Medicine Team phone at [phone] for questions and concerns.  For  individual provider: See Shea Evans

## 2022-05-28 NOTE — ED Notes (Addendum)
Resp even and unlabored. Skin pale, warm, and dry. Cap refill brisk.

## 2022-05-28 NOTE — ED Notes (Signed)
Incontinent peri care provided with male purewick replaced. Pt dc'd IV -tip intact. No bleeding noted.

## 2022-05-28 NOTE — ED Notes (Signed)
Repositioned for comfort. Pt dry-not incontinent at this time.

## 2022-05-28 NOTE — TOC Progression Note (Signed)
Transition of Care [facility]) - Progression Note    Patient Details  Name: Maurice Moses MRN: [id number] Date of Birth: 06-Nov-1945  Transition of Care [facility]) CM/SW Contact  Beverly Sessions, RN Phone Number: 05/28/2022, 12:45 PM  Clinical Narrative:    Residential Hospice Referral was made to [facility] with [facility] by Aniceto Boss with Palliative    Expected Discharge Plan: [place] Barriers to Discharge: Continued Medical Work up, Ship broker, Unsafe home situation, SNF Pending bed offer, ED Unsafe disposition, ED SNF auth  Expected Discharge Plan and Services Expected Discharge Plan: [place] In-house Referral: Clinical Social Work, Hospice / [place] Acute Care Choice: [place] arrangements for the past 2 months: Single Family Home                                       Social Determinants of Health (SDOH) Interventions    Readmission Risk Interventions     No data to display

## 2022-05-28 NOTE — TOC Progression Note (Signed)
Transition of Care [facility]) - Progression Note    Patient Details  Name: Morse Brueggemann MRN: [id number] Date of Birth: 06-Jan-1946  [facility]) CM/SW Contact  Beverly Sessions, RN Phone Number: 05/28/2022, 1:39 PM  Clinical Narrative:     Notified by Lorayne Bender with [facility] that patient has been approved for residential hospice home pending bed availability   Expected Discharge Plan: [place] Barriers to Discharge: Continued Medical Work up, Ship broker, Unsafe home situation, SNF Pending bed offer, ED Unsafe disposition, ED SNF auth  Expected Discharge Plan and Services Expected Discharge Plan: [place] In-house Referral: Clinical Social Work, Hospice / [place] Acute Care Choice: [place] arrangements for the past 2 months: Single Family Home                                       Social Determinants of Health (SDOH) Interventions    Readmission Risk Interventions     No data to display

## 2022-05-28 NOTE — ED Notes (Signed)
Palliative care at bedside.

## 2022-05-28 NOTE — ED Notes (Signed)
Discussed with wife possible ways for administering medications including crushing pills in apple sauce, putting medications in pts favorite pie, possible feeding tube placement, and IV administration. Pts wife denied all routes and states "It just makes him too upset, I don't want to do that anymore." Pts wife is okay with placing another IV if needed for pain mediations and fluids after speaking with palliative care and hospice. Palliative care team paged. Pts wife states she "absolutely" does not want pt to have a feeding tube.

## 2022-05-29 NOTE — ED Notes (Signed)
Changed patient brief at this time and ensured no stool present.

## 2022-05-29 NOTE — ED Notes (Signed)
Patient is resting comfortably. On O2 monitor and saturating well at 95%

## 2022-05-29 NOTE — Discharge Instructions (Signed)
Please follow-up with Maurice Moses primary physician re: his transition to Hospice and for any additional medications needed, though the Hospice team should also be able to provide some of these

## 2022-05-29 NOTE — ED Notes (Signed)
Patient given bed bath by this RN. Male external catheter changed and peri care provided.   Pt had BM. Pt turned, cleaned, and absorptive pads replaced. New brief placed below the patient for comfort and to keep dry.  Pillowcase exchanged, blankets straightened for comfort, and pt adjusted in bed.  Mouth swab for cleaning and for water provided to patient.  Patient tolerated all cares well. Will update pt's wife of cares provided when she returns

## 2022-05-29 NOTE — ED Notes (Signed)
This RN called Hospice and spoke with Silvio Clayman and provided full report. Report called to [phone]

## 2022-05-29 NOTE — ED Provider Notes (Addendum)
Patient is a 76 yo M with h/o dementia, recurrent CVAs, HTN< HLD, septic shock, here with failure to thrive. Pt will be sent home with Hospice care. Palliative has been involved here and pt will have meds filled by PCP/hospice team.  Here, vitals have been stable. He does not appear uncomfortable, and is resting on exam.    Duffy Bruce, MD 05/29/22 [id number]    Duffy Bruce, MD 05/29/22 [phone]

## 2022-05-29 NOTE — ED Provider Notes (Signed)
-----------------------------------------   2:55 AM on 05/29/2022 -----------------------------------------   Blood pressure (!) 149/126, pulse 79, temperature 98.7 F (37.1 C), temperature source Axillary, resp. rate (!) 22, SpO2 95 %.  The patient is calm and cooperative at this time.  There have been no acute events since the last update.  Awaiting disposition plan from case management/social work.    Chontel Warning, Delice Bison, DO 05/29/22 [phone]

## 2022-05-29 NOTE — Progress Notes (Signed)
[facility] ED [facility])   Consent forms have been completed.  EMS notified of patient D/C and transport arranged. TOC/Sarah and RN/Moriah also notified of transport arrangement. MD not assigned to patient at this time.   Please send signed DNR form with patient and RN call report to [phone].    Daphene Calamity, MSW [facility] Liaison [phone]

## 2022-05-29 NOTE — TOC Transition Note (Signed)
Transition of Care [facility]) - CM/SW Discharge Note   Patient Details  Name: Maurice Moses MRN: [id number] Date of Birth: 1945/09/15  Transition of Care [facility]) CM/SW Contact:  Candie Chroman, LCSW Phone Number: 05/29/2022, 2:45 PM   Clinical Narrative: Patient has a bed at [facility] today. Liaison will arrange EMS transport for around 5:00. RN will call report. ED staff will complete med necessity transport paperwork. MD is aware DNR needs to be signed. No further concerns. CSW signing off.  Final next level of care: [place] Barriers to Discharge: Barriers Resolved   Patient Goals and CMS Choice Patient states their goals for this hospitalization and ongoing recovery are:: Pt is disoriented and unable to participate in goal setting. CMS Medicare.gov Compare Post Acute Care list provided to:: Patient Represenative (must comment) (Spouse) Choice offered to / list presented to : Spouse  Discharge Placement                    Patient and family notified of of transfer: 05/29/22  Discharge Plan and Services In-house Referral: Clinical Social Work, Hospice / [place] Acute Care Choice: [place]                               Social Determinants of Health (SDOH) Interventions     Readmission Risk Interventions     No data to display

## 2022-06-20 DEATH — deceased

## 2022-07-16 ENCOUNTER — Other Ambulatory Visit: Payer: Medicare PPO | Admitting: Student
